# Patient Record
Sex: Male | Born: 1935 | Race: White | Hispanic: No | Marital: Married | Smoking: Former smoker
Health system: Southern US, Community
[De-identification: ages and names within clinical notes are randomized; demographics above are authoritative.]

## PROBLEM LIST (undated history)

## (undated) DIAGNOSIS — N183 Chronic kidney disease, stage 3 unspecified: Secondary | ICD-10-CM

## (undated) DIAGNOSIS — I6529 Occlusion and stenosis of unspecified carotid artery: Secondary | ICD-10-CM

## (undated) DIAGNOSIS — F419 Anxiety disorder, unspecified: Secondary | ICD-10-CM

## (undated) DIAGNOSIS — M79606 Pain in leg, unspecified: Secondary | ICD-10-CM

## (undated) DIAGNOSIS — E785 Hyperlipidemia, unspecified: Secondary | ICD-10-CM

## (undated) DIAGNOSIS — I10 Essential (primary) hypertension: Secondary | ICD-10-CM

## (undated) DIAGNOSIS — I2510 Atherosclerotic heart disease of native coronary artery without angina pectoris: Secondary | ICD-10-CM

## (undated) HISTORY — DX: Hyperlipidemia, unspecified: E78.5

## (undated) HISTORY — DX: Occlusion and stenosis of unspecified carotid artery: I65.29

## (undated) HISTORY — DX: Essential (primary) hypertension: I10

## (undated) HISTORY — DX: Pain in leg, unspecified: M79.606

## (undated) HISTORY — DX: Atherosclerotic heart disease of native coronary artery without angina pectoris: I25.10

---

## 2005-06-06 ENCOUNTER — Encounter: Payer: Self-pay | Admitting: Cardiology

## 2005-07-31 ENCOUNTER — Ambulatory Visit: Payer: Self-pay | Admitting: Cardiology

## 2005-08-03 ENCOUNTER — Ambulatory Visit: Payer: Self-pay | Admitting: Cardiology

## 2005-08-07 ENCOUNTER — Ambulatory Visit: Payer: Self-pay | Admitting: Cardiovascular Disease

## 2005-08-07 ENCOUNTER — Inpatient Hospital Stay (HOSPITAL_BASED_OUTPATIENT_CLINIC_OR_DEPARTMENT_OTHER): Admission: RE | Admit: 2005-08-07 | Discharge: 2005-08-07 | Payer: Self-pay | Admitting: Cardiology

## 2005-08-09 ENCOUNTER — Ambulatory Visit (HOSPITAL_COMMUNITY): Admission: RE | Admit: 2005-08-09 | Discharge: 2005-08-10 | Payer: Self-pay | Admitting: Cardiology

## 2005-08-09 ENCOUNTER — Ambulatory Visit: Payer: Self-pay | Admitting: Cardiology

## 2005-08-09 ENCOUNTER — Encounter: Payer: Self-pay | Admitting: Cardiology

## 2005-08-09 HISTORY — PX: CORONARY STENT PLACEMENT: SHX1402

## 2005-08-21 ENCOUNTER — Ambulatory Visit: Payer: Self-pay | Admitting: Cardiology

## 2006-01-31 ENCOUNTER — Ambulatory Visit: Payer: Self-pay | Admitting: Cardiology

## 2006-02-12 ENCOUNTER — Ambulatory Visit: Payer: Self-pay | Admitting: Cardiology

## 2006-02-13 ENCOUNTER — Encounter: Payer: Self-pay | Admitting: Physician Assistant

## 2006-02-15 ENCOUNTER — Inpatient Hospital Stay (HOSPITAL_BASED_OUTPATIENT_CLINIC_OR_DEPARTMENT_OTHER): Admission: RE | Admit: 2006-02-15 | Discharge: 2006-02-15 | Payer: Self-pay | Admitting: Cardiovascular Disease

## 2006-02-15 ENCOUNTER — Ambulatory Visit: Payer: Self-pay | Admitting: Cardiovascular Disease

## 2006-03-02 ENCOUNTER — Ambulatory Visit: Payer: Self-pay | Admitting: Cardiology

## 2006-07-03 ENCOUNTER — Ambulatory Visit: Payer: Self-pay | Admitting: Cardiology

## 2006-07-23 ENCOUNTER — Ambulatory Visit (HOSPITAL_COMMUNITY): Admission: RE | Admit: 2006-07-23 | Discharge: 2006-07-23 | Payer: Self-pay | Admitting: Ophthalmology

## 2006-11-14 ENCOUNTER — Ambulatory Visit: Payer: Self-pay | Admitting: Vascular Surgery

## 2007-05-14 ENCOUNTER — Ambulatory Visit: Payer: Self-pay | Admitting: Cardiology

## 2008-02-04 ENCOUNTER — Ambulatory Visit: Payer: Self-pay | Admitting: Cardiology

## 2008-03-03 ENCOUNTER — Ambulatory Visit: Payer: Self-pay | Admitting: Vascular Surgery

## 2008-08-28 ENCOUNTER — Encounter: Payer: Self-pay | Admitting: Cardiology

## 2008-09-01 ENCOUNTER — Ambulatory Visit: Payer: Self-pay | Admitting: Vascular Surgery

## 2008-11-05 DIAGNOSIS — I10 Essential (primary) hypertension: Secondary | ICD-10-CM | POA: Insufficient documentation

## 2008-11-05 DIAGNOSIS — I2510 Atherosclerotic heart disease of native coronary artery without angina pectoris: Secondary | ICD-10-CM | POA: Insufficient documentation

## 2008-11-05 DIAGNOSIS — E785 Hyperlipidemia, unspecified: Secondary | ICD-10-CM | POA: Insufficient documentation

## 2009-01-28 ENCOUNTER — Encounter: Payer: Self-pay | Admitting: Cardiology

## 2009-02-05 ENCOUNTER — Encounter: Payer: Self-pay | Admitting: Cardiology

## 2009-03-04 ENCOUNTER — Ambulatory Visit: Payer: Self-pay | Admitting: Vascular Surgery

## 2009-08-26 ENCOUNTER — Ambulatory Visit: Payer: Self-pay | Admitting: Vascular Surgery

## 2010-04-14 ENCOUNTER — Ambulatory Visit: Payer: Self-pay | Admitting: Vascular Surgery

## 2010-08-10 ENCOUNTER — Other Ambulatory Visit: Payer: Self-pay | Admitting: Neurosurgery

## 2010-08-10 DIAGNOSIS — M5126 Other intervertebral disc displacement, lumbar region: Secondary | ICD-10-CM

## 2010-08-12 ENCOUNTER — Other Ambulatory Visit: Payer: Self-pay | Admitting: Neurosurgery

## 2010-08-12 ENCOUNTER — Ambulatory Visit
Admission: RE | Admit: 2010-08-12 | Discharge: 2010-08-12 | Disposition: A | Discharge status: Home / Self Care | Payer: Medicare Other | Source: Ambulatory Visit | Attending: Neurosurgery | Admitting: Neurosurgery

## 2010-08-12 DIAGNOSIS — M5126 Other intervertebral disc displacement, lumbar region: Secondary | ICD-10-CM

## 2010-08-30 ENCOUNTER — Other Ambulatory Visit: Payer: Self-pay | Admitting: Neurosurgery

## 2010-08-30 DIAGNOSIS — M5126 Other intervertebral disc displacement, lumbar region: Secondary | ICD-10-CM

## 2010-08-30 NOTE — Procedures (Signed)
CAROTID DUPLEX EXAM   INDICATION:  Followup known carotid disease.   HISTORY:  Diabetes:  Yes  Cardiac:  CAD  Hypertension:  Yes  Smoking:  No  Previous Surgery:  No  CV History:  Amaurosis Fugax No, Paresthesias No, Hemiparesis No                                       RIGHT             LEFT  Brachial systolic pressure:         152               164  Brachial Doppler waveforms:         WNL               WNL  Vertebral direction of flow:        Occluded          Antegrade  DUPLEX VELOCITIES (cm/sec)  CCA peak systolic                   88                80  ECA peak systolic                   127               255  ICA peak systolic                   106 (m)           314  ICA end diastolic                   31 (m)            104  PLAQUE MORPHOLOGY:                  Heterogeneous     Heterogeneous  PLAQUE AMOUNT:                      Mild              Moderate to severe  PLAQUE LOCATION:                    CCA/ICA/ECA       CCA/ICA/ECA   IMPRESSION:  1. High end 60% to 79% left ICA stenosis.  2. 1% to 39% right ICA stenosis.  3. Apparently occluded right vertebral artery, normal left vertebral      artery.  4. Stable findings from previous exam on Aug 26, 2009.   ___________________________________________  Di Kindle. Edilia Bo, M.D.   LT/MEDQ  D:  04/14/2010  T:  04/14/2010  Job:  [id number]

## 2010-08-30 NOTE — Assessment & Plan Note (Signed)
[facility]                          [facility]   NAME:Laser, VERLAND                    MRN:          [id number]  DATE:05/14/2007                            DOB:          [birth year]    PRIMARY CARDIOLOGIST:  Learta Codding, MD, [facility]   PRIMARY CARE PHYSICIAN:  Donzetta Sprung, MD   REASON FOR VISIT:  Ten month follow-up.   HISTORY OF PRESENT ILLNESS:  Mr. Canela is a 76 year old male patient  with a history of coronary artery disease status post stenting to the  RCA in April 2007 with chronic high-grade ostial stenosis in a small  left circumflex which is treated medically who presents to the office  today for follow-up.  Please refer to Gene Serpe's, P.A.-C note from  July 03, 2006 for complete details.   The patient does have significant left ICA stenosis.  He is followed by  Dr. Cari Caraway in [place].  The patient tells me he was last at  Dr. Adele Dan office this past summer and apparently had re-look carotid  Dopplers without significant change.   The patient denies any intercurrent development of chest discomfort.  He  denies any exertional chest heaviness or tightness.  He is doing quite  well, walking a mile or more every day and using his other cardiac  equipment at home without chest pain or shortness of breath.  He denies  any syncope or near syncope, orthopnea, PND or pedal edema.  He denies  any palpitations.   CURRENT MEDICATIONS:  1. Aspirin 325 mg daily.  2. Atenolol 50 mg daily.  3. Lisinopril/hydrochlorothiazide 25/12.5 mg daily.  4. Metformin 500 mg four times a day.  5. Simvastatin 80 mg nightly.  6. Isosorbide 30 mg daily.  7. Nitroglycerin p.r.n. chest pain.   ALLERGIES:  CODEINE AND IVP DYE.   PHYSICAL EXAM:  He is well-nourished, well-developed male in no acute  distress. Blood pressure is 138/73, pulse 69, weight 187 pounds.  HEENT is normal.  NECK:  Without JVD.  CARDIAC:  S1, S2,  regular rate and rhythm without murmur.  LUNGS:  Clear to auscultation bilaterally.  Without wheezing, rhonchi or  rales.  ABDOMEN: Soft and nontender.  There is no hepatosplenomegaly.  EXTREMITIES: Without edema.  NEUROLOGIC:  He is alert and oriented x3.  Cranial nerves II-XII grossly  intact.   Electrocardiogram has significant noise and we will repeat an EKG before  he leaves office today.   IMPRESSION:  1. Coronary artery disease.  Status post bare-metal stenting to the      right coronary artery in April 2007.      a.     Cardiac catheterization November 2007 with patent right       coronary artery stent, 70-80% ostial stenosis in a small left       circumflex, totally occluded third obtuse marginal collateralized       from the distal AV groove circumflex.  2. Preserved left ventricular function.  3. Cerebral vascular disease.      a.     Left ICA stenosis 60-79%, followed by Dr.  Cari Caraway.  4. Diabetes mellitus.  5. Hyperlipidemia followed by Dr. Reuel Boom.  6. Hypertension.  7. IV DYE ALLERGY.   PLAN:  The patient presents to the office today for follow-up.  Overall,  he is doing well without any complaints of chest pain, shortness of  breath.  He questioned whether not he could come off of his isosorbide.  At this point in time we plan to:  1. His repeat electrocardiogram in the office today reveals normal      sinus rhythm with heart rate of 62, normal axis, no acute changes.  2. I have explained to him that he likely has some parts of his      coronary anatomy that could contribute to chest pain.  He is      welcome to try a holiday from isosorbide.  If he develops any      symptoms of chest discomfort or shortness of breath or decreased      exercise tolerance,  I have explained to him, that he should go      back on isosorbide and remain on it permanently.  He is in      agreement with this plan.  3. He will follow up with Dr. Reuel Boom for management of his  lipids.      His goal LDL is less than 70.  4. Follow-up with Dr. Edilia Bo for his carotid artery disease.  5. We will see him back in follow-up in the next 6 months or sooner      p.r.n.      Tereso Newcomer, PA-C  Electronically Signed      Learta Codding, MD,FACC  Electronically Signed   SW/MedQ  DD: 05/14/2007  DT: 05/14/2007  Job #: [id number]   cc:   Donzetta Sprung

## 2010-08-30 NOTE — Procedures (Signed)
CAROTID DUPLEX EXAM   INDICATION:  Carotid disease.   HISTORY:  Diabetes:  Yes.  Cardiac:  CAD.  Hypertension:  Yes.  Smoking:  No.  Previous Surgery:  No.  CV History:  Currently asymptomatic.  Amaurosis Fugax No, Paresthesias No, Hemiparesis No.                                       RIGHT             LEFT  Brachial systolic pressure:         130               136  Brachial Doppler waveforms:         Normal            Normal  Vertebral direction of flow:        Not detected      Antegrade  DUPLEX VELOCITIES (cm/sec)  CCA peak systolic                   118               90  ECA peak systolic                   135               196  ICA peak systolic                   137               322  ICA end diastolic                   37                99  PLAQUE MORPHOLOGY:                  Mixed             Mixed  PLAQUE AMOUNT:                      Mild              Moderate/severe  PLAQUE LOCATION:                    ICA, ECA          ICA, ECA   IMPRESSION:  1. 40% to 59% stenosis of the right internal carotid artery.  2. High-end 60% to 79% stenosis of the left internal carotid artery.  3. Mild increase in the right internal carotid artery Doppler      velocities when compared to the previous examination on 03/04/2009      with the left internal carotid artery remaining stable.   ___________________________________________  Di Kindle. Edilia Bo, M.D.   CH/MEDQ  D:  08/26/2009  T:  08/26/2009  Job:  [id number]

## 2010-08-30 NOTE — Procedures (Signed)
CAROTID DUPLEX EXAM   INDICATION:  Follow up evaluation of known carotid artery disease.   HISTORY:  Diabetes:  Yes.  Cardiac:  Coronary artery disease.  Hypertension:  Yes.  Smoking:  No.  Previous Surgery:  No.  CV History:  Patient reports no cerebrovascular symptoms at this time.  Previous duplex on March 03, 2008, revealed 20% to 39% right ICA  stenosis and 60% to 79% left ICA stenosis.  Amaurosis Fugax No, Paresthesias No, Hemiparesis No.                                       RIGHT             LEFT  Brachial systolic pressure:         148               148  Brachial Doppler waveforms:         Triphasic         Triphasic  Vertebral direction of flow:        Inaudible         Antegrade  DUPLEX VELOCITIES (cm/sec)  CCA peak systolic                   91                80  ECA peak systolic                   150               239  ICA peak systolic                   113               271  ICA end diastolic                   29                83  PLAQUE MORPHOLOGY:                  Calcified         Calcified,  irregular  PLAQUE AMOUNT:                      Mild to moderate  Moderate  PLAQUE LOCATION:                    Proximal ICA      Proximal ICA, ECA   IMPRESSION:  1. Right vertebral artery is occluded.  2. A 20% to 39% right ICA stenosis.  3. A 60% to 79% left ICA stenosis.  4. No significant change from previous study performed March 03, 2008.   ___________________________________________  Di Kindle. Edilia Bo, M.D.   MC/MEDQ  D:  09/01/2008  T:  09/01/2008  Job:  [id number]

## 2010-08-30 NOTE — Assessment & Plan Note (Signed)
[facility]                          [facility] NOTE   NAME:[name], [name]                    MRN:          [id number]  DATE:02/04/2008                            DOB:          [birth year]    REFERRING PHYSICIAN:  Donzetta Sprung   REFERRING PHYSICIAN:  Wallis Bamberg. Reuel Boom, DO   HISTORY OF PRESENT ILLNESS:  [name] is a 76 year old male with a  history of coronary artery disease status post stent to the RCA in April  2007 with chronic high-grade ostial stenosis of the small left  circumflex, treated medically.  The patient has been doing well.  He  reports no chest pain, orthopnea, PND, palpitations, or syncope.  Last  office visit, he was taken off Imdur and has had no further chest pain.  He is requesting followup with Dr. Edilia Bo regarding his carotid artery  disease.   MEDICATIONS:  1. Atenolol 50 mg p.o. daily.  2. Lisinopril/hydrochlorothiazide 20/12.5 daily.  3. Metformin 500 mg p.o. q.i.d.  4. Simvastatin 80 mg daily.  5. Aspirin 81 mg daily.   ALLERGIES:  CODEINE and IVP DYE.   PHYSICAL EXAMINATION:  VITAL SIGNS:  Blood pressure 160/79, heart rate  60, weight 188 pounds.  NECK:  Normal carotid upstroke with no definite bruits.  HEART:  Regular rate and rhythm.  Normal S1 and S2.  No murmurs, rubs,  or gallops.  ABDOMEN:  Soft.  EXTREMITIES:  No cyanosis, clubbing, or edema.  NEURO:  The patient alert and oriented, grossly nonfocal.   PROBLEMS:  1. Coronary artery disease.      a.     Status post bare-metal stent to the right coronary artery in       April 2007.      b.     Ostial stenosis in the small left circumflex.  2. Preserved left ventricular function.  3. Cerebrovascular disease.      a.     Left internal carotid artery stenosis 79%, followed by Dr.       Waverly Ferrari.  4. Diabetes mellitus.  5. Dyslipidemia.  6. Hypertension.  7. IVP DYE allergy.   PLAN:  1. The patient will follow up with her  primary care with Dr. Reuel Boom      regarding lipids and hypertension control.  2. From the coronary artery disease perspective, the patient is doing      quite well with no recurrent chest pain and is only due for stress      test in 1 year from now.  3. EKG was reviewed in the office today and was within normal limits.     Learta Codding, MD,FACC  Electronically Signed    GED/MedQ  DD: 02/04/2008  DT: 02/05/2008  Job #: [id number]   cc:   Donzetta Sprung

## 2010-08-30 NOTE — Procedures (Signed)
CAROTID DUPLEX EXAM   INDICATION:  Follow-up carotid doppler.   HISTORY:  Diabetes:  Yes, on oral medication.  Cardiac:  Coronary artery disease, PTCA in 2007.  Hypertension:  Yes.  Smoking:  No.  Previous Surgery:  No.  CV History:  No  Amaurosis Fugax no.  Paresthesias no.  Hemiparesis no.                                         RIGHT             LEFT  Brachial systolic pressure:         100.              90.  Brachial Doppler waveforms:         Biphasic.         Biphasic.  Vertebral direction of flow:        Not detected      Antegrade  DUPLEX VELOCITIES (cm/sec)  CCA peak systolic                   92.               74.  ECA peak systolic                   204.              204.  ICA peak systolic                   99.               210.  ICA end diastolic                   31.               85.  PLAQUE MORPHOLOGY:                  Calcified.        Calcified.  PLAQUE AMOUNT:                      Moderate to large.                  Moderate to large.  PLAQUE LOCATION:                    ICA, ECA.         ICA, ECA.   IMPRESSION:      1.Bilateral external carotid artery stenoses.      2.     20% to 39% right internal carotid artery stenosis.      3.60% to 79% left internal carotid artery stenosis.      4.Right vertebral artery not detected, possible occlusion.      5.Study essentially unchanged from 04/18/06.   ___________________________________________  Di Kindle. Edilia Bo, M.D.   DP/MEDQ  D:  11/14/2006  T:  11/15/2006  Job:  [id number]

## 2010-08-30 NOTE — Assessment & Plan Note (Signed)
[facility]                          [facility] NOTE   NAME:[name], [name]                    MRN:          [id number]  DATE:02/04/2008                            DOB:          04/26/1934    REFERRING PHYSICIAN:  Lanell Matar, DO   HISTORY OF PRESENT ILLNESS:  The patient is a.    DICTATION ENDS HERE.     Learta Codding, MD,FACC     GED/MedQ  DD: 02/04/2008  DT: 02/05/2008  Job #: [id number]

## 2010-08-30 NOTE — Procedures (Signed)
CAROTID DUPLEX EXAM   INDICATION:  Follow-up carotid artery disease.   HISTORY:  Diabetes:  Yes.  Cardiac:  CAD, PTCA.  Hypertension:  Yes.  Smoking:  No.  Previous Surgery:  No.  CV History:  Asymptomatic.  Amaurosis Fugax No, Paresthesias No, Hemiparesis No.                                       RIGHT             LEFT  Brachial systolic pressure:         150               146  Brachial Doppler waveforms:         Normal            Normal  Vertebral direction of flow:        Not detected      Antegrade  DUPLEX VELOCITIES (cm/sec)  CCA peak systolic                   121               107  ECA peak systolic                   165               255  ICA peak systolic                   98                295  ICA end diastolic                   29                85  PLAQUE MORPHOLOGY:                  Heterogeneous     Heterogeneous  PLAQUE AMOUNT:                      Mild              Moderate  PLAQUE LOCATION:                    ICA/ECA           ICA/ECA/CCA   IMPRESSION:  1. A 1-39% stenosis of the right internal carotid artery.  2. High-end 60-79% stenosis of the left internal carotid artery.  3. The right vertebral artery flow could not be detected, which      indicates a possible occlusion.  4. No significant change noted when compared to the previous exam on      11/14/2006.   ___________________________________________  Di Kindle. Edilia Bo, M.D.   CH/MEDQ  D:  03/03/2008  T:  03/03/2008  Job:  [id number]

## 2010-08-30 NOTE — Assessment & Plan Note (Signed)
OFFICE VISIT   [name], [name]  DOB:  02-Jul-1934                                       03/03/2008  CHART#:[id number]   I saw the patient in the office today for continued followup of his left  carotid stenosis.  I had seen him in January of 2008 with a 60-79% left  carotid stenosis.  He was asymptomatic and I explained that we generally  do not consider carotid endarterectomy unless the stenosis progressed to  greater than 80%.  He was to come back in 6 months for a followup duplex  scan.  In July of 2008 he had a followup duplex scan which showed no  significant change in the left carotid stenosis.  He was then lost to  followup and Dr. Andee Lineman picked this up and set him up for a carotid  duplex scan.   Since I saw him last he has had no history of stroke, TIAs, expressive  or receptive aphasia or amaurosis fugax.   REVIEW OF SYSTEMS:  On review of systems he has had no chest pain, chest  pressure, palpitations or arrhythmias.  He has had no bronchitis, asthma  or wheezing.   PHYSICAL EXAMINATION:  On physical examination this is a pleasant 76-  year-old gentleman who appears his stated age.  Blood pressure is  148/26, heart rate is 67.  Neck is supple.  He has a left carotid bruit.  Lungs are clear bilaterally to auscultation.  On cardiac exam he has a  regular rate and rhythm without significant murmur.  Abdomen is soft and  nontender.  Neurologic exam is nonfocal.   Carotid duplex scan shows that the left carotid stenosis has not  changed, still in the 60-79% range and the velocities are essentially  the same as in July of 2008.  He has no significant stenosis on the  right.  He does have possible right vertebral artery occlusion but is  asymptomatic from the standpoint with no history of right arm symptoms  or dizziness.   I have recommend a followup duplex scan in 6 months and we will be sure  he gets back for this.  He knows to call sooner if  he has problems.  In  the meantime he knows to continue taking his aspirin.   Di Kindle. Edilia Bo, M.D.  Electronically Signed   CSD/MEDQ  D:  03/03/2008  T:  03/04/2008  Job:  [id number]   cc:   Learta Codding, MD,FACC  Donzetta Sprung

## 2010-08-30 NOTE — Procedures (Signed)
CAROTID DUPLEX EXAM   INDICATION:  Follow-up of carotid disease.   HISTORY:  Diabetes:  Yes  Cardiac:  CAD  Hypertension:  Yes  Smoking:  No  Previous Surgery:  No  CV History:  Asymptomatic  Amaurosis Fugax No, Paresthesias No, Hemiparesis No                                       RIGHT             LEFT  Brachial systolic pressure:         124               126  Brachial Doppler waveforms:         Triphasic         Triphasic  Vertebral direction of flow:        Occluded?         Antegrade  DUPLEX VELOCITIES (cm/sec)  CCA peak systolic                   71                75  ECA peak systolic                   141               172  ICA peak systolic                   101               301  ICA end diastolic                   35                104  PLAQUE MORPHOLOGY:                  Mixed             Mixed  PLAQUE AMOUNT:                      Mild              Severe  PLAQUE LOCATION:                    Bifurcation, ICA. Bifurcation, ICA,  ECA.   IMPRESSION:  1. High grade 60%-79% stenosis in the left internal carotid artery.  2. Possibly occluded right vertebral.   ___________________________________________  Di Kindle. Edilia Bo, M.D.   CJ/MEDQ  D:  03/04/2009  T:  03/04/2009  Job:  [id number]

## 2010-08-31 ENCOUNTER — Ambulatory Visit
Admission: RE | Admit: 2010-08-31 | Discharge: 2010-08-31 | Disposition: A | Discharge status: Home / Self Care | Payer: Medicare Other | Source: Ambulatory Visit | Attending: Neurosurgery | Admitting: Neurosurgery

## 2010-08-31 DIAGNOSIS — M5126 Other intervertebral disc displacement, lumbar region: Secondary | ICD-10-CM

## 2010-09-02 NOTE — Assessment & Plan Note (Signed)
[facility]                            [facility] NOTE   NAME:[name], [name]                    MRN:          [id number]  DATE:02/12/2006                            DOB:          [birth year]    ADDENDUM TO JOB #[id number]:   IMPRESSION:  IVP DYE allergy.   PLAN:  The patient will also require premedication for reported history of  contrast dye allergy.     ______________________________  Rozell Searing, PA-C    ______________________________  Jonelle Sidle, MD   GS/MedQ  DD: 02/12/2006  DT: 02/13/2006  Job #: [id number]

## 2010-09-02 NOTE — Discharge Summary (Signed)
[name], [name]           ACCOUNT NO.:  [id number]   MEDICAL RECORD NO.:  [id number]          PATIENT TYPE:  OIB   LOCATION:  6532                         FACILITY:  [facility]   PHYSICIAN:  Salvadore Farber, M.D. LHCDATE OF BIRTH:  [birth year]   DATE OF ADMISSION:  08/09/2005  DATE OF DISCHARGE:  08/10/2005                                 DISCHARGE SUMMARY   CARDIOLOGIST:  Learta Codding, M.D.   PRINCIPAL DIAGNOSIS:  Coronary artery disease, status post elective bare  metal stenting, mid right coronary artery.   SECONDARY DIAGNOSIS:  1.  Type 2 diabetes mellitus.  2.  Dyslipidemia.  3.  Hypertension.   OPERATION/PROCEDURE:  Coronary angiogram/bare metal stenting by Dr. Randa Evens, April 25.   REASON FOR ADMISSION:  Mr. Fines is a 76 year old male, with no prior  cardiac history, recently referred to Dr. Andee Lineman for follow up of an  abnormal stress perfusion study.  He was referred for a diagnostic cardiac  catheterization in the [facility] lab on April 23 and now returns for elective  percutaneous intervention of the right coronary artery.   HOSPITAL COURSE:  The patient underwent successful bare metal stenting of an  80% mid right coronary artery stenosis by Dr. Samule Ohm with no noted  complications.  The patient was kept for overnight observation and cleared  for discharge the following morning in hemodynamically stable condition.  The patient did have a small elevation of the MB which was felt to be due to  jailing of a small acute marginal artery.  Dr. Samule Ohm also noted finding of  a very soft bruit which he noted was present prior to the procedure and  thus, no further work-up was indicated.  Dr. Samule Ohm recommended treatment  with Plavix for 30 days.   DISCHARGE LABORATORY DATA:  Sodium 142, potassium 4.3, glucose 129, BUN 12,  creatinine 1.3, WBC 6.2, hemoglobin 11.3, hematocrit 32, MCV 90, platelets  180.  Outstanding labs:  CPK 123/8.2 (6.7%)   DISCHARGE  MEDICATIONS:  1.  Plavix 75 mg daily for 30 days.  2.  Coated aspirin 325 mg daily.  3.  Atenolol 50 mg daily.  4.  Isosorbide mononitrate 20 mg daily.  5.  Lisinopril/HCT 20/12.5 mg daily.  6.  Lipitor 40 mg daily.  7.  Avandia 8 mg daily.  8.  Nitrostat 0.4 mg.   DISCHARGE INSTRUCTIONS:  Refer to cardiac catheterization discharge sheet  for further instructions.   FOLLOW UP:  Learta Codding, M.D. on May 7 at 10 a.m. at his [place] clinic.   DISPOSITION:  Stable.   Discharge duration:  Less than 30 minutes.      Rozell Searing, P.A. [facility]      Salvadore Farber, M.D. [facility]  Electronically Signed    GS/MEDQ  D:  08/10/2005  T:  08/11/2005  Job:  [id number]   cc:   Donzetta Sprung  Fax: [phone]

## 2010-09-02 NOTE — Assessment & Plan Note (Signed)
[facility]                            [facility] OFF[facility] NOTE   NAME:[name], [name]                    MRN:          [id number]  DATE:03/02/2006                            DOB:          1934/09/11    PRIMARY CARDIOLOGIST:  Learta Codding, MD,FACC   REASON FOR OFFICE VISIT:  Post catheterization followup.   [name] returns following recent elective coronary angiogram on November  1 by Dr. Excell Seltzer.  This was following a recent abnormal stress test  suggestive of inferior ischemia in the context of previous bare metal  stenting of the mid right coronary artery in April of this year.   Dr. Excell Seltzer, however, found stable coronary anatomy, essentially unchanged  from the previous study.  He noted that the RCA stent site was widely patent  with severe diffuse disease of the PDA.  Given the small caliber of the PDA  vessel, he felt that it was not amenable to percutaneous intervention and,  therefore, recommended medical therapy.  He provided a prescription for  Imdur, but Mr. Brucato has not yet filled this.   From a clinical standpoint, the patient tells me today that he has not had  any angina pectoris since the catheterization.  He points out that he has  had chest pain only when using a treadmill at home under strenuous  conditions.  In fact, he tells me he had no chest pain during the recent  stress test here at [facility].  Additionally, he states that his  current chest pain is not as severe as that which was associated with the  stent back in April.   The patient also was referred for carotid Doppler analysis which was just  performed 2 days ago.  This was following the finding of a left carotid  bruit.  The results of the study are abnormal and suggestive of occlusion of  the distal right internal carotid artery with 50% proximal, 60-70% mid left  ICA stenosis and occlusion of the right vertebral artery.   The patient  reports no associated symptoms suggestive of a TIA.   CURRENT MEDICATIONS:  1. Coated aspirin 325 mg daily.  2. Atenolol 50 daily.  3. Lisinopril/HCT 20/4.5 daily.  4. Metformin 500 four times a day.  5. Simvastatin 80 daily.   PHYSICAL EXAMINATION:  VITAL SIGNS: Blood pressure 118/60, pulse 60 and  regular. Weight 196.  GENERAL: A 76 year old male sitting upright, in no current distress.  NECK: Palpable bilateral carotid pulses with no bruit on the right but with  a bruit on the left.  LUNGS: Clear to auscultation all fields.  HEART:  Regular rate and rhythm (S1, S2).  No murmurs, rubs, or gallops.  ABDOMEN: Soft, nontender.  EXTREMITIES:  Right groin stable with no ecchymosis, hematoma, or bruit on  auscultation; intact right femoral and distal pulse.  NEUROLOGIC: No focal deficits.   IMPRESSION:  1. Multivessel coronary artery disease, stable.      a.     Widely patent mid right coronary artery stent with residual       nonobstructive left anterior descending  disease; high-grade ostial       stenosis of small left circumflex artery; 100% occluded third obtuse       marginal stenosis with left-to-left collateralization; severe, diffuse       posterior descending artery disease (small caliber) by recent coronary       angiogram.      b.     Normal left ventricular function.      c.     Status post bare metal stenting, mid right coronary artery,       April 2007.  2. Severe cerebrovascular disease with occlusion of the distal right      internal carotid artery with significant left internal carotid artery      disease and suggestion of right vertebral artery stenosis by current      duplex study.  3. Hyperlipidemia.  4. Type 2 diabetes mellitus.  5. Hypertension.  6. CONTRAST DYE allergy.   PLAN:  1. Following review of the recent carotid Doppler study with Dr. Andee Lineman,      recommendation is to refer Mr. Lobue to CVTS in [place] for      consideration of surgical  treatment.  I have reviewed these results      with the patient, and he is agreeable to proceed with further      evaluation by the vascular surgeons in [place].  2. Continue medical therapy.  I advised Mr. Hackbart to fill his      prescription for Imdur and to continue on this medication indefinitely.  3. Schedule return clinic followup with myself and Dr. Andee Lineman in 3 months      for reassessment of his clinical status.      Gene Serpe, PA-C  Electronically Signed      Learta Codding, MD,FACC  Electronically Signed   GS/MedQ  DD: 03/02/2006  DT: 03/02/2006  Job #: [id number]   cc:   Donzetta Sprung

## 2010-09-02 NOTE — Cardiovascular Report (Signed)
[name], [name]           ACCOUNT NO.:  [id number]   MEDICAL RECORD NO.:  [id number]          PATIENT TYPE:  OIB   LOCATION:  1962                         FACILITY:  [facility]   PHYSICIAN:  Veverly Fells. Excell Seltzer, MD  DATE OF BIRTH:  [birth year]   DATE OF PROCEDURE:  DATE OF DISCHARGE:  02/15/2006                              CARDIAC CATHETERIZATION   DATE OF PROCEDURE:  February 15, 2006.   PROCEDURE:  Left heart catheterization, selective coronary angiography, left  ventricular angiography.   INDICATION:  [name] is a very nice 76 year old gentleman who has known  coronary artery disease.  He has diffuse 3 vessel disease and is status post  stenting of the mid right coronary artery in April of this year.  The  remainder of his disease has been treated medically as he has small vessels  with a chronic occlusion which is well collateralized.  He had some chest  discomfort a few weeks back while working out on the treadmill.  He  underwent an exercise Cardiolite that demonstrated inferior ischemia.  He  subsequently was referred for cardiac catheterization to rule out a problem  with InStent restenosis in the mid right coronary artery or progression of  his disease.   PROCEDURAL DETAILS:  Risks and indications of the procedure were explained  in detail to the patient.  Informed consent was obtained.  The right groin  was prepped, draped, and anesthetized with 1% lidocaine under normal sterile  conditions.  Using a modified Seldinger technique, a 4 French arterial  sheath was placed in the right femoral artery.  Multiple angiographic views  of the right and left coronary arteries were taken.  For the left coronary  artery, a 4 [place] JL4 catheter was used.  For the right coronary artery, a  4 French 3DRC catheter was used.  Following selective coronary angiography,  an angle pigtail catheter was placed in the left ventricle.  Left  ventricular pressures were recorded.  A 30 degree  right anterior oblique  left ventriculogram was performed.  A pullback across the aortic valve was  done.  At the conclusion of the case, the patient was transferred to the  holding area where his sheath will be pulled and a manual pressure used for  hemostasis.   FINDINGS:  Aortic pressure 135/67 with a mean of 98, left ventricular  pressure 140/7 with an end diastolic pressure of 11.  There was no  significant aortic stenosis.   Left main stem is calcified.  There is no significant angiographic disease  throughout the left main stem.   The left main stem bifurcates into the LAD and left circumflex.  The LAD is  a diffusely diseased vessel.  It is a medium caliber vessel that courses  down to the distal anterior wall but does not reach the apex.  There is  calcification and diffuse non obstructive disease throughout.  It gives off  2 medium caliber diagonal branches, both of which have non obstructive  disease.  There is no high grade obstructive disease throughout the LAD  system.   The left circumflex is a small  diameter vessel.  It has an osteal 70-80%  stenosis.  It is also calcified.  There is a small 1st obtuse marginal and a  medium caliber 2nd obtuse marginal.  There is no significant disease in the  first 2 OM's.  There is a 3rd obtuse marginal that is completely occluded  and it is well collateralized from the distal AV groove circumflex.   The true circumflex itself is occluded just beyond the 2nd obtuse marginal,  and again, is well collateralized from an atrial branch.   Right coronary artery is severely calcified.  The proximal vessel has non  obstructive disease.  The mid vessel has a 40% stenosis.  There is a stent  in the mid to distal vessel that is widely patent.  The distal vessel has  non obstructive disease.  The RCA terminates in a PDA and a posterior AV  segment which gives off 2  posterolateral branches, the 2nd of which is a  large vessel.  The PDA has a  long segment of severe stenosis in a range of  80-90%.  The vessel is diffusely diseased and is relatively small diameter.  The posterolateral branches have diffuse non obstructive disease throughout  in the range of 25-50%.   Left ventriculogram demonstrates normal left ventricular function with an  estimated left ventricular ejection fraction of 65%.   ASSESSMENT:  1. Diffuse 3 vessel coronary artery disease.      a.     Calcified, non obstructive disease throughout the LAD.      b.     Small left circumflex with high grade osteal stenosis and an       occluded 3rd obtuse marginal that is well collateralized from left to       left collaterals.      c.     Diffusely diseased right coronary artery with a widely patent       stent in the mid right coronary artery.  There is severe diffuse       disease of the PDA which is a small diameter vessel.  2. Normal left ventricular function.   PLAN:  The patient has no significant changes since his last catheterization  back in April of this year.  His stent in the mid right coronary artery is  widely patent.  He is not having dramatic, life style limiting symptoms at  this point.  I think his inferior wall ischemia is secondary to his PDA  disease.  If he develops progressive symptoms that are refractory to medical  therapy, it would be reasonable to perform PCI on his PDA.  However, it  would involve a long, bare metal stent as it is an area of diffuse disease  and it is a small diameter vessel.  I think the long term results of  intervening on his PDA would not be very good.  Because of that, I would  start with medical therapy and only perform PCI if we are unable to control  his symptoms.      Veverly Fells. Excell Seltzer, MD  Electronically Signed     MDC/MEDQ  D:  02/15/2006  T:  02/15/2006  Job:  [id number]   cc:   Learta Codding, MD,FACC  Jonelle Sidle, MD

## 2010-09-02 NOTE — Cardiovascular Report (Signed)
[name], [name]           ACCOUNT NO.:  [id number]   MEDICAL RECORD NO.:  [id number]          PATIENT TYPE:  OIB   LOCATION:  6532                         FACILITY:  [facility]   PHYSICIAN:  Salvadore Farber, M.D. LHCDATE OF BIRTH:  1934/10/28   DATE OF PROCEDURE:  08/09/2005  DATE OF DISCHARGE:                              CARDIAC CATHETERIZATION   PROCEDURE:  Bare-metal stenting of the mid-LAD.   INDICATIONS:  Mr. Nouri is a 76 year old gentleman who presents with [place] Heart Association Class III angina which has been refractory to medical  therapy. He underwent diagnostic angiography two days ago by Dr. Eden Emms.  This demonstrated a total occlusion of obtuse marginal branch with fairly  robust left-to-left collaterals.  He also had an 80% stenosis of the mid RCA  and a 90% stenosis of a small posterior descending artery.  I was asked to  revascularize the mid RCA lesion. My recommendation is for conservative  management of both the PDA and OM.   PROCEDURAL TECHNIQUE:  Informed consent was obtained.  Under 1% lidocaine  local anesthesia, a 6-French sheath was placed in the right common femoral  artery using the modified Seldinger technique.  Anticoagulation was  initiated with bivalirudin.  ACT was confirmed to be greater than 225  seconds.  The patient had been loaded on Plavix 48 hours prior to the  procedure and maintained on 75 mg per day thereafter.  He was continued on  aspirin.   An 6-French ART 3.5 guide was advanced over a wire and engaged the ostium of  the RCA.  A Prowater wire was advanced to the distal RCA without difficulty.  I then attempted to predilate the lesion using a 2.5 x 9 mm Maverick.  At 10  atmospheres, the lesion would not yield.  I therefore removed this balloon  and then predilated using a 2.5 x 8 mm Maverick at 14 atmospheres.  With  this, the lesion did yield.  I then attempted to deliver a 3.0 x 12 mm  Vision stent.  However, I was  unable to deliver it beyond a calcified  tortuous segment of the more proximal RCA.  I then advanced a wiggle wire to  be used, leaving the Prowater wire as a buddy.  Over the wiggle wire, I was  able to advance the 3.0 x 12 mm Vision with some difficulty.  It was  positioned across the lesion and deployed at 16 atmospheres.  I then  postdilated this stent using a 3.0 x 12 mm Quantum at 18 atmospheres.  Final  angiography demonstrated no residual stenosis and TIMI III flow to the  distal vasculature.  There was an area of haziness proximal to the stent.  I  attempted to perform intravascular ultrasound of this but was unable to  deliver the [facility] catheter.  I tried this over both the wiggle wire and  the Prowater.  In both cases, it would not pass the proximal portion of the  vessel.  Repeat angiography demonstrated complete resolution of the haziness  after additional nitroglycerin.  i thus felt was not dissection and to  be  managed conservatively.   The arteriotomy was then closed using a Star close device.  Complete  hemostasis was obtained.  He was then transferred to the holding room in  stable condition, having tolerated the procedure well.   COMPLICATIONS:  None.   IMPRESSION/RECOMMENDATIONS:  Successful bare-metal stenting of the mid RCA.  He should be maintained on Plavix for 30 days and aspirin indefinitely.      Salvadore Farber, M.D. [facility]  Electronically Signed     WED/MEDQ  D:  08/09/2005  T:  08/10/2005  Job:  [id number]   cc:   Learta Codding, M.D. [facility]  [address]  [address]  [place]  [place] [address]   Lia Hopping  Fax: [phone]

## 2010-09-02 NOTE — Assessment & Plan Note (Signed)
[facility]                            [facility] OFFICE NOTE   NAME:[name], [name]                    MRN:          [id number]  DATE:02/12/2006                            DOB:          05/25/1934    PRIMARY CARDIOLOGIST:  Dr. Lewayne Bunting.   REASON FOR OFFICE VISIT:  Abnormal exercise stress test.   [name] is a very pleasant 76 year old male, with known coronary artery  disease, who underwent bare metal stenting of the mid-right coronary artery  in April of this year.  This was following an abnormal stress test, and the  initial diagnostic catheterization revealed multi-vessel coronary artery  disease with 30% multiple proximal/mid-LAD stenoses; multiple 30-40% first  and second diagonal branch lesions; 80% proximal circumflex artery stenosis,  beginning at the ostium of the left main; 100% occlusion of the mid-LAD with  left-left collateralization; and, 30-40% multiple lesions of the proximal-  mid RCA, with the 80% distal RCA lesion; in addition, 40-50% multiple PDA  and PLA discreet lesions.  Left ventricular function with hyperdymanic (EF  of 70%).   Following placement of the bare metal stent, patient was kept on a regimen  of Plavix for 30 days.  He reports a compliance and has remained on Aspirin.   However, a few weeks ago, patient experienced some mild anterior chest  discomfort while using his treadmill at home.  This promptly resolved with  rest.  However, the discomfort was reminiscent of, but not nearly as  severely as, that which was prior to his percutaneous intervention.  He was  referred for an exercise stress Cardiolite, during which he exercised for  almost 10 minutes and had no associated chest discomfort, but which was  suggestive of a small/moderate inferior defect which appeared reversible and  with no associated significant ST abnormalities.  Ejection fraction was 66%.   Patient denies any symptoms at rest.   He has not had any chest discomfort  since several weeks ago, and he again confirmed that he did not have any  during his recent stress test.   CURRENT MEDICATIONS:  1. Coated aspirin 325 q.d.  2. Atenolol 50 q.d.  3. Lisinopril HCT 20/12.5 q.d.  4. Avandia 8 q.d.  5. Lipitor 80 q.d.   PHYSICAL EXAMINATION:  VITAL SIGNS:  Blood pressure 126/60, pulse 60,  regular.  Weight 196.  GENERAL:  A 76 year old male, sitting upright, no apparent distress.  HEENT:  Normocephalic, atraumatic.  NECK:  Palpable carotid pulses with high-pitched left carotid bruits.  LUNGS:  Clear to auscultation in all fields.  HEART:  Regular rate and rhythm (S1, S2).  No significant murmurs.  ABDOMEN:  Soft, nontender with intact bowel sounds.  EXTREMITIES:  Preserved peripheral pulses, slightly diminished on the left,  with no significant edema.  NEURO:  No focal deficits.   IMPRESSION:  1. Recurrent exertional angina pectoris.      a.     Worrisome for in-stent restenosis, with recent abnormal exercise       stress Cardiolite suggestive of inferior ischemia.  2. Multi-vessel coronary artery disease.  a.     Status post bare metal stenting.  Mid-right coronary artery,       April 2007.      b.     Residual 50% distal left main; non-obstructive LAD; 80% proximal       circumflex at ostium of the left main artery; 100% mid-LAD with left-       left collaterals; 50% PDA and PLA stenoses.      c.     Hyperdynamic LV function.  3. Hyperlipidemia.  4. Type 2 diabetes mellitus.  5. Hypertension.  6. Obesity.  7. Left carotid bruit.  8. IVP DYE allergy.   PLAN:  I reviewed the recent stress test results with [name] and  discussed the two possible options.  A repeat cardiac catheterization in the  [facility] lab to rule out in-stent restenosis versus the option of medical therapy.  I had also conferred with Dr. Simona Huh regarding these current findings,  and we discussed at length these two options.  The  patient, however, would  prefer to proceed with definitive exclusion of an in-stent restenotic lesion  and, therefore, has agreed to proceed with an elective cath to be scheduled  in the [facility] lab later this week.  We will continue current medication regimen  and hold his diabetic medication and his diuretic the morning of the  procedure.  We will also check complete blood work here in the office.  Of  note, patient will also need further evaluation of the left carotid bruit,  and we will therefore schedule carotid Dopplers to rule out significant ICA  disease.  The patient will also require premedication for reported history  of contrast dye allergy.     ______________________________  Rozell Searing, PA-C    ______________________________  Jonelle Sidle, MD   GS/MedQ  DD: 02/12/2006  DT: 02/13/2006  Job #: [id number]   cc:   Donzetta Sprung

## 2010-09-02 NOTE — Cardiovascular Report (Signed)
[name], [name]           ACCOUNT NO.:  [id number]   MEDICAL RECORD NO.:  [id number]          PATIENT TYPE:  OIB   LOCATION:  1963                         FACILITY:  [facility]   PHYSICIAN:  Charlton Haws, M.D.     DATE OF BIRTH:  [birth year]   DATE OF PROCEDURE:  DATE OF DISCHARGE:                              CARDIAC CATHETERIZATION   Coronary arteriography.   INDICATIONS:  Angina with an abnormal Myoview.   Left main coronary artery had a distal 40-50% stenosis.   Left anterior descending artery had 30% multiple discreet lesions in  proximal mid vessel.  The distal vessel was small diffusely diseased.  First  and second diagonal branches were small vessels with 30-40% multiple  discreet lesions.   The circumflex coronary artery had an 80% tubular lesion, proximally,  beginning at the ostium of the left main. The mid vessel was 100% occluded  with left-to-left collaterals.   The right coronary artery is extremely calcified.  There was 30-40% multiple  discreet lesions in the proximal and mid vessel.  There was an 80% discrete  lesion distally.  The PDA and PLA had 40-50% multiple discreet lesions.   ROA ventriculography:  ROA ventriculography showed hyperdynamic LV function  with an EF of 70%.  There was no gradient across the aortic valve, no MR.  Aortic pressure was 135/74, LV pressure was 135/15.   IMPRESSION:  [name] will be started on Imdur 30 mg a day.  He will be  loaded with Plavix.  We will refer him to the inpatient lab for angioplasty  of the distal right coronary artery on Wednesday.   His Myoview is abnormal in the inferior wall, and I suspect this is the  culprit vessel.   Despite having the ostial lesion in the circumflex, I think I would  initially angioplasty the right coronary artery and give him a trial of  medical therapy. He has not had all that much chest pain and it is clearly  exertional.   He has a good warning system, and I think he will  do well with angioplasty  of the right with Plavix and Imdur.  Intervening on the ostium of the  circumflex would be somewhat difficult and there is not much runoff since  the mid vessel is occluded with left-to-left collaterals.           ______________________________  Charlton Haws, M.D.     PN/MEDQ  D:  08/07/2005  T:  08/08/2005  Job:  [id number]   cc:   Learta Codding, M.D. [facility]  [address]  [address]  [place]  [place] [address]   Donzetta Sprung  Fax: [phone]

## 2010-09-02 NOTE — Assessment & Plan Note (Signed)
[name]                          [name] OFFICE NOTE   NAME:[name]                    MRN:          [id number]  DATE:07/03/2006                            DOB:          [birth year]    PRIMARY CARDIOLOGIST:  Dr. Lewayne Bunting.   REASON FOR VISIT:  Scheduled three-month followup.  Please refer to my  clinic note of 02/2006 for full details.   Since his last clinic followup, the patient has been seen by Dr. Cari Caraway for further evaluation of an abnormal carotid duplex scan.  He  did a repeat carotid Doppler, however, and confirmed the finding of 60-  79% left ICA stenosis.  However, it did not reveal any evidence of  distal right internal carotid artery occlusion.  This was in direct  opposition to what had been suggested by the Dopplers which we had  ordered here at [name].   Dr. Edilia Bo recommended continued medical management and close  monitoring with repeat Dopplers and followup with him in six months.   Clinically, [name] is doing extremely well since his last visit.  He is exercising on a regular basis at home, employing both the  treadmill and an exercycle.  He has only rare instances of mild chest  tightness with initial warm-up on the treadmill with prompt resolution  and with no recurrent symptoms after resuming his exercise routine.   CURRENT MEDICATIONS:  Aspirin, full dose.  Atenolol 50 mg daily.  Lisinopril HCT 20/12.5 daily.  Metformin 500 mg daily.  Simvastatin 80 mg daily.  Isosorbide 30 mg daily.   PHYSICAL EXAMINATION:  VITAL SIGNS:  Blood pressure 128/78.  Pulse 78,  regular.  Weight 191.6.  GENERAL:  This is a 76 year old male sitting upright and in no distress.  HEENT:  Normocephalic, atraumatic.  NECK:  Palpable bilateral carotid pulses with faint left carotid bruit,  no bruit on the right.  LUNGS:  Clear to auscultation in all fields.  HEART:  Regular rate and rhythm (S1, S2).  No  significant murmurs.  ABDOMEN:  Benign.  EXTREMITIES:  No significant edema.  NEURO:  No focal deficit.   IMPRESSION:  1. Multi-vessel coronary artery disease.      a.     Status post bare metal stent right coronary artery April       2007: Widely patent by re-look coronary angiogram November 2007.      b.     Residual nonobstructive LAD; high-grade osteal small CF axis       stenosis; 100% occluded OM3 with distal collaterization; severe       diffuse PDA disease.      c.     Normal left ventricular function.  2. Cerebral vascular disease.      a.     Left ICA stenosis 60-79%, followed by Dr. Cari Caraway.  3. Type 2 diabetes mellitus.  4. Hyperlipidemia, followed by Dr. Reuel Boom.  5. Hypertension.  6. CONTRAST DYE ALLERGY.   PLAN:  1. Down titrate aspirin to 81 mg daily.  2. Aggressive lipid management with target LDL goal of 70 or  less, per      Dr. Reuel Boom.  3. Schedule return clinic followup with myself and Dr. Andee Lineman in six      months.      Gene Serpe, PA-C  Electronically Signed      Learta Codding, MD,FACC  Electronically Signed   GS/MedQ  DD: 07/03/2006  DT: 07/03/2006  Job #: [id number]   cc:   Donzetta Sprung

## 2010-09-14 ENCOUNTER — Other Ambulatory Visit: Payer: Self-pay | Admitting: Neurosurgery

## 2010-09-14 DIAGNOSIS — M5126 Other intervertebral disc displacement, lumbar region: Secondary | ICD-10-CM

## 2010-09-15 ENCOUNTER — Ambulatory Visit
Admission: RE | Admit: 2010-09-15 | Discharge: 2010-09-15 | Disposition: A | Discharge status: Home / Self Care | Payer: Medicare Other | Source: Ambulatory Visit | Attending: Neurosurgery | Admitting: Neurosurgery

## 2010-09-15 ENCOUNTER — Other Ambulatory Visit: Payer: Self-pay | Admitting: Neurosurgery

## 2010-09-15 DIAGNOSIS — M5126 Other intervertebral disc displacement, lumbar region: Secondary | ICD-10-CM

## 2010-09-22 ENCOUNTER — Ambulatory Visit: Payer: Self-pay

## 2010-09-22 ENCOUNTER — Other Ambulatory Visit: Payer: Self-pay

## 2010-10-20 ENCOUNTER — Other Ambulatory Visit: Payer: Self-pay

## 2010-10-20 ENCOUNTER — Ambulatory Visit: Payer: Self-pay

## 2010-11-29 ENCOUNTER — Encounter: Payer: Self-pay | Admitting: Family Medicine

## 2010-12-14 ENCOUNTER — Encounter: Payer: Self-pay | Admitting: Vascular Surgery

## 2010-12-20 ENCOUNTER — Encounter: Payer: Self-pay | Admitting: Vascular Surgery

## 2010-12-21 ENCOUNTER — Encounter: Payer: Self-pay | Admitting: Vascular Surgery

## 2010-12-21 ENCOUNTER — Ambulatory Visit (INDEPENDENT_AMBULATORY_CARE_PROVIDER_SITE_OTHER): Payer: Medicare Other | Admitting: Vascular Surgery

## 2010-12-21 ENCOUNTER — Ambulatory Visit (INDEPENDENT_AMBULATORY_CARE_PROVIDER_SITE_OTHER): Payer: Medicare Other

## 2010-12-21 VITALS — BP 161/71 | HR 83

## 2010-12-21 DIAGNOSIS — I6529 Occlusion and stenosis of unspecified carotid artery: Secondary | ICD-10-CM

## 2010-12-21 NOTE — Progress Notes (Signed)
CC: tight left carotid stenosis  HPI: [name] is a 76 y.o. male who was referred by Dr.Terry Reuel Boom with a tight left carotid stenosis. He had previously been followed in our office with peripheral vascular disease and I have not seen him since November of 2009. He did have a carotid duplex scan in our office on 04/14/2010 which showed a 60-79% left carotid stenosis with no significant stenosis on the right. On his most recent followup duplex scan it was noted that his carotid stenosis on the left at progressed to greater than 80% and he sent for vascular consultation.  Patient denies any history of stroke, TIAs, expressive or receptive aphasia, or amaurosis fugax. Of note he is right-handed. Does have a history of hypertension hyperlipidemia and diabetes all of which are stable on his current medications.  Past Medical History  Diagnosis Date  . Diabetes mellitus   . Hypertension   . Hyperlipidemia   . CAD (coronary artery disease)   . Leg pain     with walking  . Carotid artery occlusion     FAMILY HISTORY: Family History  Problem Relation Age of Onset  . Hypertension Mother   . COPD Father   . Heart disease Sister   . Heart disease Brother   . Heart disease Brother     SOCIAL HISTORY: History  Substance Use Topics  . Smoking status: Former Smoker    Types: Cigarettes    Quit date: 04/17/1981  . Smokeless tobacco: Not on file  . Alcohol Use: No    Allergies  Allergen Reactions  . Omnipaque (Iohexol)   . Codeine Nausea And Vomiting    Current Outpatient Prescriptions  Medication Sig Dispense Refill  . aspirin 81 MG tablet Take 81 mg by mouth daily.        Marland Kitchen atenolol (TENORMIN) 50 MG tablet Take 50 mg by mouth daily.        Marland Kitchen lisinopril-hydrochlorothiazide (PRINZIDE,ZESTORETIC) 20-12.5 MG per tablet Take 1 tablet by mouth daily.        . metFORMIN (GLUMETZA) 500 MG (MOD) 24 hr tablet Take 500 mg by mouth 4 (four) times daily.        . simvastatin (ZOCOR) 80  MG tablet Take 80 mg by mouth at bedtime.          REVIEW OF SYSTEMS: CARDIOVASCULAR: No chest pain, chest pressure, palpitations, orthopnea, or dyspnea on exertion. No claudication or rest pain. No history of DVT or phlebitis. PULMONARY: No productive cough, asthma, or wheezing. NEUROLOGIC: No weakness, paresthesias, aphasia, amaurosis, or dizziness. HEMATOLOGIC: No bleeding problems or clotting disorders. MUSCULOSKELETAL: No joint pain or swelling. GASTROINTESTINAL: No blood in stool or hematemesis. GENITOURINARY: No dysuria or hematuria. PSYCHIATRIC: No history of major depression. INTEGUMENTARY: No rashes or ulcers. CONSTITUTIONAL: No fever or chills.  PHYSICAL EXAM: Filed Vitals:   12/21/10 1536  BP: 161/71  Pulse: 83   There is no height or weight on file to calculate BMI.  GENERAL: The patient appears their stated age. The vital signs are documented above. CARDIOVASCULAR: There is a regular rate and rhythm without significant murmur appreciated. The patient has bilateral carotid bruits. He has palpable femoral pulses with warm well-perfused feet. PULMONARY: There is good air exchange bilaterally without wheezing or rales. ABDOMEN: Soft and non-tender with normal pitched bowel sounds.  MUSCULOSKELETAL: There are no major deformities or cyanosis. NEUROLOGIC: No focal weakness or paresthesias are detected. SKIN: There are no ulcers or rashes noted. PSYCHIATRIC: The patient has a  normal affect.  DATA: 1. have independently reviewed her carotid duplex scan from our office which shows a greater than 80% left carotid stenosis with no significant stenosis on the right. He also has occluded right vertebral artery. Arm pressures are essentially equal. 2. I have also reviewed his records from Dr.Daniel's office. Patient has been having problems with low back pain and is followed by Dr. Trey Sailors. He is actually scheduled to have redo back surgery at the end of September. I've also  reviewed the carotid duplex scan which had been obtained at the outlying institution which also showed a greater than 80% left carotid stenosis based on the loss of the criteria.   MEDICAL ISSUES: 1. This patient has an asymptomatic greater than 80% left carotid stenosis. I have recommended left carotid endarterectomy.  I have reviewed the indications for carotid endarterectomy, that is to lower the risk of future stroke. I have also reviewed the potential complications of surgery, including but not limited to: bleeding, stroke (perioperative risk 1-2%), MI, nerve injury of other unpredictable medical problems. All of the patients questions were answered and they are agreeable to proceed with surgery. I would recommend that his carotid surgery be done prior to his redo back surgery in order to lower his risk of perioperative stroke during his back surgery. He will discuss this with Dr.Roy. He does know to continue taking his aspirin right up through surgery. His wife is leaving on a trip and so the surgery is scheduled until 01/10/2011.

## 2010-12-28 NOTE — Procedures (Unsigned)
CAROTID DUPLEX EXAM  INDICATION:  Follow up known carotid artery disease.  HISTORY: Diabetes:  Yes. Cardiac:  Coronary artery disease. Hypertension:  Yes. Smoking:  No. Previous Surgery:  No. CV History:  Currently asymptomatic. Amaurosis Fugax No, Paresthesias No, Hemiparesis No. Other:  Hyperlipidemia.                                      RIGHT             LEFT Brachial systolic pressure:         152               154 Brachial Doppler waveforms:         Normal            Normal Vertebral direction of flow:        Occluded          Antegrade DUPLEX VELOCITIES (cm/sec) CCA peak systolic                   79                77 ECA peak systolic                   131               216 ICA peak systolic                   110               352 ICA end diastolic                   39                125 PLAQUE MORPHOLOGY:                  Heterogenous      Mixed PLAQUE AMOUNT:                      Mild              Severe PLAQUE LOCATION:                    CCA, ICA, ECA     CCA, ICA, ECA  IMPRESSION: 1. Right internal carotid artery velocities suggest 1% to 39%     stenosis. 2. Left internal carotid artery velocities suggest 80% to 99%     stenosis. 3. Occluded right vertebral artery.  ___________________________________________ Di Kindle. Edilia Bo, M.D.  EM/MEDQ  D:  12/21/2010  T:  12/21/2010  Job:  [id number]

## 2011-01-03 ENCOUNTER — Encounter (HOSPITAL_COMMUNITY)
Admission: RE | Admit: 2011-01-03 | Discharge: 2011-01-03 | Disposition: A | Discharge status: Home / Self Care | Payer: Medicare Other | Source: Ambulatory Visit | Attending: Vascular Surgery | Admitting: Vascular Surgery

## 2011-01-03 ENCOUNTER — Other Ambulatory Visit: Payer: Self-pay | Admitting: Vascular Surgery

## 2011-01-03 DIAGNOSIS — I6522 Occlusion and stenosis of left carotid artery: Secondary | ICD-10-CM

## 2011-01-03 LAB — URINALYSIS, ROUTINE W REFLEX MICROSCOPIC
Glucose, UA: NEGATIVE mg/dL
Ketones, ur: NEGATIVE mg/dL
Leukocytes, UA: NEGATIVE
Nitrite: NEGATIVE
Protein, ur: NEGATIVE mg/dL

## 2011-01-03 LAB — COMPREHENSIVE METABOLIC PANEL
ALT: 26 U/L (ref 0–53)
AST: 25 U/L (ref 0–37)
CO2: 26 mEq/L (ref 19–32)
Calcium: 9.8 mg/dL (ref 8.4–10.5)
Chloride: 106 mEq/L (ref 96–112)
Creatinine, Ser: 1.31 mg/dL (ref 0.50–1.35)
GFR calc Af Amer: >60 mL/min (ref >60)
GFR calc non Af Amer: 53 mL/min — ABNORMAL LOW (ref >60)
Glucose, Bld: 118 mg/dL — ABNORMAL HIGH (ref 70–99)
Total Bilirubin: 0.4 mg/dL (ref 0.3–1.2)

## 2011-01-03 LAB — ABO/RH: ABO/RH(D): A POS

## 2011-01-03 LAB — TYPE AND SCREEN: ABO/RH(D): A POS

## 2011-01-03 LAB — PROTIME-INR: Prothrombin Time: 12.8 seconds (ref 11.6–15.2)

## 2011-01-03 LAB — CBC
Hemoglobin: 11.9 g/dL — ABNORMAL LOW (ref 13.0–17.0)
MCH: 28.2 pg (ref 26.0–34.0)
MCHC: 34.1 g/dL (ref 30.0–36.0)
Platelets: 187 10*3/uL (ref 150–400)
RBC: 4.22 MIL/uL (ref 4.22–5.81)

## 2011-01-10 ENCOUNTER — Inpatient Hospital Stay (HOSPITAL_COMMUNITY)
Admission: RE | Admit: 2011-01-10 | Discharge: 2011-01-12 | DRG: 039 | Disposition: A | Discharge status: Home / Self Care | Payer: Medicare Other | Source: Ambulatory Visit | Attending: Vascular Surgery | Admitting: Vascular Surgery

## 2011-01-10 ENCOUNTER — Other Ambulatory Visit: Payer: Self-pay | Admitting: Vascular Surgery

## 2011-01-10 DIAGNOSIS — Z7982 Long term (current) use of aspirin: Secondary | ICD-10-CM

## 2011-01-10 DIAGNOSIS — I6529 Occlusion and stenosis of unspecified carotid artery: Principal | ICD-10-CM | POA: Diagnosis present

## 2011-01-10 DIAGNOSIS — Z01812 Encounter for preprocedural laboratory examination: Secondary | ICD-10-CM

## 2011-01-10 DIAGNOSIS — I658 Occlusion and stenosis of other precerebral arteries: Secondary | ICD-10-CM | POA: Diagnosis present

## 2011-01-10 DIAGNOSIS — Z01818 Encounter for other preprocedural examination: Secondary | ICD-10-CM

## 2011-01-10 DIAGNOSIS — I2510 Atherosclerotic heart disease of native coronary artery without angina pectoris: Secondary | ICD-10-CM | POA: Diagnosis present

## 2011-01-10 DIAGNOSIS — E785 Hyperlipidemia, unspecified: Secondary | ICD-10-CM | POA: Diagnosis present

## 2011-01-10 DIAGNOSIS — I10 Essential (primary) hypertension: Secondary | ICD-10-CM | POA: Diagnosis present

## 2011-01-10 DIAGNOSIS — Z87891 Personal history of nicotine dependence: Secondary | ICD-10-CM

## 2011-01-10 DIAGNOSIS — E119 Type 2 diabetes mellitus without complications: Secondary | ICD-10-CM | POA: Diagnosis present

## 2011-01-10 DIAGNOSIS — Z79899 Other long term (current) drug therapy: Secondary | ICD-10-CM

## 2011-01-10 DIAGNOSIS — Z23 Encounter for immunization: Secondary | ICD-10-CM

## 2011-01-10 HISTORY — PX: CAROTID ENDARTERECTOMY: SUR193

## 2011-01-10 LAB — GLUCOSE, CAPILLARY
Glucose-Capillary: 178 mg/dL — ABNORMAL HIGH (ref 70–99)
Glucose-Capillary: 268 mg/dL — ABNORMAL HIGH (ref 70–99)
Glucose-Capillary: 194 mg/dL — ABNORMAL HIGH (ref 70–99)

## 2011-01-10 NOTE — Op Note (Signed)
[name], [name]           ACCOUNT NO.:  [id number]  MEDICAL RECORD NO.:  [id number]  LOCATION:  2550                         FACILITY:  [facility]  PHYSICIAN:  Di Kindle. Edilia Bo, M.D.DATE OF BIRTH:  [birth year]  DATE OF PROCEDURE: DATE OF DISCHARGE:                              OPERATIVE REPORT   PREOPERATIVE DIAGNOSIS:  Asymptomatic greater than 80% left carotid stenosis.  POSTOPERATIVE DIAGNOSIS:  Asymptomatic greater than 80% left carotid stenosis.  PROCEDURE:  Left carotid endarterectomy with Dacron patch angioplasty.  SURGEON:  Di Kindle. Edilia Bo, MD  ASSISTANT:  Newton Pigg, PA  INDICATIONS:  This is a 76 year old gentleman who had a moderate left carotid stenosis on a followup study.  This had progressed to greater than 80% and left carotid endarterectomy was recommended in order to lower his risk of future stroke.  TECHNIQUE:  The patient was taken to the operating room, received a general anesthetic.  The left neck was prepped and draped in usual sterile fashion.  An incision was made along the anterior border of the sternocleidomastoid and dissection carried down to the common carotid artery which was dissected free and controlled with Rumel tourniquet. Facial vein was divided between 2-0 silk ties.  The internal carotid artery was controlled above the plaque and the external and superior thyroid arteries were controlled.  The patient was then heparinized with 8000 units of IV heparin.  After the heparin had adequately circulated, the internal, the external, and the common carotid artery were clamped. A longitudinal arteriotomy was made in the common carotid artery and this was extended to the plaque into the internal carotid artery above the plaque.  A 10 shunt was placed into the internal carotid artery, back bled, and then placed in the common carotid artery secured with Rumel tourniquet.  Flow was reestablished to the shunt.   An endarterectomy plane was established proximally and the plaque was sharply divided.  Eversion endarterectomy was performed of the external carotid artery.  Distally there was a nice taper in the internal carotid artery plaque and no tacking sutures were required.  The artery was irrigated with copious amounts of dextran and heparin and all loose debris removed.  The Dacron patch was then sewn using continuous 6-0 Prolene suture.  Prior to completing the patch closure, the shunt was removed.  The arteries were back bled and flushed appropriately and the anastomosis completed.  Flow was reestablished first to the external carotid artery and then into the internal carotid artery.  There was an excellent pulse and Doppler signal distal to the patch at the completion.  The heparin was partially reversed with 30 mg of protamine. The wound was then closed with deep layer of 3-0 Vicryl.  The platysma was closed with running 3-0 Vicryl.  The skin was closed with 4-0 subcuticular stitch.  A sterile dressing was applied.  The patient tolerated the procedure well and awoke neurologically intact.  All needle and sponge counts were correct.     Di Kindle. Edilia Bo, M.D.     CSD/MEDQ  D:  01/10/2011  T:  01/10/2011  Job:  [id number]  cc:   Donzetta Sprung, MD Payton Doughty, M.D.  Electronically Signed by Reita Chard.D.  on 01/10/2011 01:59:41 PM

## 2011-01-11 LAB — CBC
MCHC: 34 g/dL (ref 30.0–36.0)
Platelets: 196 10*3/uL (ref 150–400)
RDW: 12.9 % (ref 11.5–15.5)
WBC: 11.5 10*3/uL — ABNORMAL HIGH (ref 4.0–10.5)

## 2011-01-11 LAB — GLUCOSE, CAPILLARY
Glucose-Capillary: 275 mg/dL — ABNORMAL HIGH (ref 70–99)
Glucose-Capillary: 68 mg/dL — ABNORMAL LOW (ref 70–99)
Glucose-Capillary: 125 mg/dL — ABNORMAL HIGH (ref 70–99)

## 2011-01-11 LAB — BASIC METABOLIC PANEL
BUN: 11 mg/dL (ref 6–23)
Chloride: 106 mEq/L (ref 96–112)
GFR calc Af Amer: >60 mL/min (ref >60)
GFR calc non Af Amer: >60 mL/min (ref >60)
Potassium: 4.7 mEq/L (ref 3.5–5.1)
Sodium: 138 mEq/L (ref 135–145)

## 2011-01-11 LAB — HEMOGLOBIN A1C: Mean Plasma Glucose: 134 mg/dL — ABNORMAL HIGH (ref <117)

## 2011-01-24 ENCOUNTER — Encounter: Payer: Self-pay | Admitting: Vascular Surgery

## 2011-01-25 ENCOUNTER — Ambulatory Visit (INDEPENDENT_AMBULATORY_CARE_PROVIDER_SITE_OTHER): Payer: Medicare Other | Admitting: Vascular Surgery

## 2011-01-25 ENCOUNTER — Encounter: Payer: Self-pay | Admitting: Vascular Surgery

## 2011-01-25 VITALS — BP 163/76 | HR 64 | Temp 98.2°F | Resp 16 | Ht 67.0 in | Wt 184.0 lb

## 2011-01-25 DIAGNOSIS — I6529 Occlusion and stenosis of unspecified carotid artery: Secondary | ICD-10-CM

## 2011-01-25 DIAGNOSIS — Z9889 Other specified postprocedural states: Secondary | ICD-10-CM

## 2011-01-25 NOTE — Progress Notes (Signed)
[facility]  Patient name: [name] MRN: [id number] DOB: [birth year] Sex: male  CC: F/U after left carotid endarterectomy  HPI: [name] is a 76 y.o. male who underwent a left carotid endarterectomy on 01/10/2011. He returns for his first followup visit. He has had no specific complaints. Specifically he denies weakness, paresthesias, expressive or receptive aphasia.  Past Medical History  Diagnosis Date  . Diabetes mellitus   . Hypertension   . Hyperlipidemia   . CAD (coronary artery disease)   . Leg pain     with walking  . Carotid artery occlusion     Family History  Problem Relation Age of Onset  . Hypertension Mother   . COPD Father   . Heart disease Sister   . Heart disease Brother   . Heart disease Brother     SOCIAL HISTORY: History  Substance Use Topics  . Smoking status: Former Smoker    Types: Cigarettes    Quit date: 04/17/1981  . Smokeless tobacco: Not on file  . Alcohol Use: No    Allergies  Allergen Reactions  . Omnipaque (Iohexol)   . Codeine Nausea And Vomiting    Current Outpatient Prescriptions  Medication Sig Dispense Refill  . aspirin 81 MG tablet Take 81 mg by mouth daily.        Marland Kitchen atenolol (TENORMIN) 50 MG tablet Take 50 mg by mouth daily.        Marland Kitchen lisinopril-hydrochlorothiazide (PRINZIDE,ZESTORETIC) 20-12.5 MG per tablet Take 1 tablet by mouth daily.        . metFORMIN (GLUMETZA) 500 MG (MOD) 24 hr tablet Take 500 mg by mouth 4 (four) times daily.        . mupirocin (BACTROBAN) 2 % ointment       . promethazine (PHENERGAN) 25 MG tablet       . simvastatin (ZOCOR) 80 MG tablet Take 80 mg by mouth at bedtime.          REVIEW OF SYSTEMS: Arly.Keller ] denotes positive finding; [  ] denotes negative finding CARDIOVASCULAR:  [ ]  chest pain   [ ]  chest pressure   [ ]  palpitations   [ ]  orthopnea   [ ]  dyspnea on exertion   [ ]  claudication   [ ]  rest pain   [ ]  DVT   [ ]  phlebitis PULMONARY:   [ ]   productive cough   [ ]  asthma   [ ]  wheezing NEUROLOGIC:   [ ]  weakness  [ ]  paresthesias  [ ]  aphasia  [ ]  amaurosis  [ ]  dizziness HEMATOLOGIC:   [ ]  bleeding problems   [ ]  clotting disorders MUSCULOSKELETAL:  [ ]  joint pain   [ ]  joint swelling GASTROINTESTINAL: [ ]   blood in stool  [ ]   hematemesis GENITOURINARY:  [ ]   dysuria  [ ]   hematuria PSYCHIATRIC:  [ ]  history of major depression INTEGUMENTARY:  [ ]  rashes  [ ]  ulcers CONSTITUTIONAL:  [ ]  fever   [ ]  chills  PHYSICAL EXAM: Filed Vitals:   01/25/11 0856 01/25/11 0857  BP: 180/77 163/76  Pulse: 64 64  Temp: 98.2 F (36.8 C)   TempSrc: Oral   Resp: 16   Height: 5\' 7"  (1.702 m)   Weight: 184 lb (83.462 kg)    Body mass index is 28.82 kg/(m^2). GENERAL: The patient is a well-nourished male, in no acute distress. The vital signs are documented above. CARDIOVASCULAR: There is a regular rate  and rhythm without significant murmur appreciated.  PULMONARY: There is good air exchange bilaterally without wheezing or rales. MUSCULOSKELETAL: There are no major deformities or cyanosis. NEUROLOGIC: No focal weakness or paresthesias are detected. SKIN: his incision on the left neck is healing nicely.  MEDICAL ISSUES: The patient is doing well status post left carotid endarterectomy. I have ordered a followup carotid duplex scan in 6 months and I'll see him back at that time. He knows to call sooner if he has problems. In the meantime he knows to continue taking his aspirin.  Jeana Kersting S [facility] [place] Office: [phone]

## 2011-02-06 NOTE — Discharge Summary (Signed)
[name], [name]           ACCOUNT NO.:  [id number]  MEDICAL RECORD NO.:  [id number]  LOCATION:  3309                         FACILITY:  [facility]  PHYSICIAN:  Di Kindle. Edilia Bo, M.D.DATE OF BIRTH:  1934-04-23  DATE OF ADMISSION:  01/10/2011 DATE OF DISCHARGE:  01/12/2011                              DISCHARGE SUMMARY   ADMITTING DIAGNOSIS:  Left internal carotid artery stenosis.  PAST MEDICAL HISTORY AND DISCHARGE DIAGNOSES: 1. Left carotid stenosis status post left carotid endarterectomy. 2. Diabetes mellitus. 3. Hypertension. 4. Hyperlipidemia. 5. Coronary artery disease. 6. Leg pain with walking.  BRIEF HISTORY:  The patient is a 76 year old male who had a moderate left carotid stenosis on followup study.  This has progressed to greater than 80% and left carotid endarterectomy was recommended for stroke risk reduction.  HOSPITAL COURSE:  The patient was admitted and taken to the OR on January 10, 2011 for a left carotid endarterectomy with Dacron patch angioplasty.  The patient tolerated the procedure well and was hemodynamically stable immediately postoperatively.  He was transferred from the OR to the postanesthesia care unit in stable condition.  The patient was extubated without complication and woke up from anesthesia neurologically intact.  The patient's early postoperative course was complicated by hypotension. He was started on dopamine and had to be maintained on this through the morning of postoperative day 2.  His dopamine was subsequently weaned and discontinued and he was able to maintain an adequate blood pressure prior to discharge.  He remained afebrile and with otherwise stable vital signs throughout the hospital course.  His Foley was discontinued without difficulty.  He was able to ambulate and tolerate a regular diet also without difficulty.  On January 12, 2011, he was without complaint.  He was ambulating without  difficulty.  PHYSICAL EXAMINATION:  CARDIAC:  Regular rate and rhythm. LUNGS:  Clear to auscultation. INCISION:  Clean, dry, and intact with no evidence of hematoma. NEUROLOGIC:  Intact.  The patient was doing well with a stable blood pressure.  His blood pressure medications had been held and were going to continue to be held until he follows up with his primary care physician as an outpatient. The patient was ready for discharge.  LABORATORIES:  CBC and BMP on January 11, 2011, white count 11.5, hemoglobin 10.3, hematocrit 30.3, platelets 196.  Sodium 138, potassium 4.7, BUN 11, creatinine 4.16.  The patient received specific written discharge instructions regarding diet, activity, wound care.  The patient is to follow up with Dr. Edilia Bo in 2 weeks after discharge.  DISCHARGE MEDICATIONS: 1. Aspirin 81 mg daily. 2. Trazodone 50 mg at bedtime. 3. Omeprazole 20 mg daily. 4. Atenolol 50 mg daily. 5. Lisinopril/hydrochlorothiazide 20/12.5 mg daily. 6. Glipizide 5 mg daily. 7. Simvastatin 80 mg daily. 8. Metformin 500 mg 2 tabs b.i.d. 9. The patient was instructed to hold atenolol and lisinopril until     follow up with his primary care physician for recheck of his blood     pressure.     Pecola Leisure, PA   ______________________________ Di Kindle. Edilia Bo, M.D.    AY/MEDQ  D:  01/23/2011  T:  01/24/2011  Job:  [id number]  Electronically Signed by Marchelle Folks  [facility] PA on 02/03/2011 09:42:05 AM Electronically Signed by Waverly Ferrari M.D. on 02/06/2011 02:02:44 PM

## 2011-07-10 HISTORY — PX: BACK SURGERY: SHX140

## 2011-07-10 HISTORY — PX: SPINE SURGERY: SHX786

## 2011-07-26 ENCOUNTER — Ambulatory Visit: Payer: Medicare Other | Admitting: Vascular Surgery

## 2011-07-26 ENCOUNTER — Other Ambulatory Visit: Payer: Medicare Other

## 2011-08-29 ENCOUNTER — Encounter: Payer: Self-pay | Admitting: Vascular Surgery

## 2011-08-30 ENCOUNTER — Encounter: Payer: Self-pay | Admitting: Vascular Surgery

## 2011-08-30 ENCOUNTER — Other Ambulatory Visit (INDEPENDENT_AMBULATORY_CARE_PROVIDER_SITE_OTHER): Payer: MEDICARE | Admitting: *Deleted

## 2011-08-30 ENCOUNTER — Ambulatory Visit (INDEPENDENT_AMBULATORY_CARE_PROVIDER_SITE_OTHER): Payer: MEDICARE | Admitting: Vascular Surgery

## 2011-08-30 VITALS — BP 145/63 | HR 63 | Temp 98.2°F | Resp 16 | Ht 67.0 in | Wt 180.8 lb

## 2011-08-30 DIAGNOSIS — I6529 Occlusion and stenosis of unspecified carotid artery: Secondary | ICD-10-CM

## 2011-08-30 DIAGNOSIS — Z48812 Encounter for surgical aftercare following surgery on the circulatory system: Secondary | ICD-10-CM

## 2011-08-30 DIAGNOSIS — Z9889 Other specified postprocedural states: Secondary | ICD-10-CM

## 2011-08-30 NOTE — Assessment & Plan Note (Signed)
The patient's left carotid endarterectomy site is widely patent and he has no significant carotid stenosis on the right. He is not a smoker and he remains on his aspirin. I've ordered a carotid duplex scan in 1 year. I'll plan on seeing him back in 2 yearsunless there is any significant change in his carotid duplex scan next year. He'll continue taking his aspirin and knows to call sooner if he has any problems.

## 2011-08-30 NOTE — Progress Notes (Signed)
Vascular and Vein Specialist of [facility]  Patient name: [name] MRN: [id number] DOB: 14-May-1934 Sex: male  REASON FOR VISIT: follow up of carotid disease  HPI: [name] is a 77 y.o. male who underwent a left carotid endarterectomy in September 2012. He comes in for yearly follow up visit. Since I saw him last she's had no history of stroke, TIAs, expressive or receptive aphasia, or amaurosis fugax. His been no significant change in his medical history except that he said back surgery and has had an excellent result.  Past Medical History  Diagnosis Date  . Diabetes mellitus   . Hypertension   . Hyperlipidemia   . CAD (coronary artery disease)   . Leg pain     with walking  . Carotid artery occlusion    SOCIAL HISTORY: History  Substance Use Topics  . Smoking status: Former Smoker    Types: Cigarettes    Quit date: 04/17/1981  . Smokeless tobacco: Not on file  . Alcohol Use: No   Current Outpatient Prescriptions  Medication Sig Dispense Refill  . aspirin 81 MG tablet Take 81 mg by mouth daily.        Marland Kitchen atenolol (TENORMIN) 50 MG tablet Take 50 mg by mouth daily.        Marland Kitchen gabapentin (NEURONTIN) 300 MG capsule Take 300 mg by mouth BID times 48H.      Marland Kitchen GLIPIZIDE XL 5 MG 24 hr tablet Take 5 mg by mouth Daily.      Marland Kitchen lisinopril-hydrochlorothiazide (PRINZIDE,ZESTORETIC) 20-12.5 MG per tablet Take 1 tablet by mouth daily.        . metFORMIN (GLUMETZA) 500 MG (MOD) 24 hr tablet Take 500 mg by mouth 4 (four) times daily.        . mupirocin (BACTROBAN) 2 % ointment       . omeprazole (PRILOSEC) 20 MG capsule Take 20 mg by mouth Daily.      Marland Kitchen oxyCODONE-acetaminophen (PERCOCET) 10-325 MG per tablet Take 10-325 mg by mouth Three times a day. Two Tabs Tid      . promethazine (PHENERGAN) 25 MG tablet       . simvastatin (ZOCOR) 80 MG tablet Take 80 mg by mouth at bedtime.        . traZODone (DESYREL) 50 MG tablet Take 50 mg by mouth At bedtime as needed.        REVIEW OF  SYSTEMS: Arly.Keller ] denotes positive finding; [  ] denotes negative finding  CARDIOVASCULAR:  [ ]  chest pain   [ ]  chest pressure   [ ]  palpitations   [ ]  orthopnea   [ ]  dyspnea on exertion   [ ]  claudication   [ ]  rest pain   [ ]  DVT   [ ]  phlebitis PULMONARY:   [ ]  productive cough   [ ]  asthma   [ ]  wheezing NEUROLOGIC:   [ ]  weakness  [ ]  paresthesias  [ ]  aphasia  [ ]  amaurosis  [ ]  dizziness HEMATOLOGIC:   [ ]  bleeding problems   [ ]  clotting disorders MUSCULOSKELETAL:  [ ]  joint pain   [ ]  joint swelling [ ]  leg swelling GASTROINTESTINAL: [ ]   blood in stool  [ ]   hematemesis GENITOURINARY:  [ ]   dysuria  [ ]   hematuria CONSTITUTIONAL:  [ ]  fever   [ ]  chills  PHYSICAL EXAM: Filed Vitals:   08/30/11 1500 08/30/11 1501  BP: 146/71 145/63  Pulse: 62 63  Temp: 98.2  F (36.8 C) 98.2 F (36.8 C)  TempSrc: Oral Oral  Resp: 16 16  Height: 5\' 7"  (1.702 m) 5\' 7"  (1.702 m)  Weight: 180 lb 12.8 oz (82.01 kg) 180 lb 12.8 oz (82.01 kg)  SpO2: 100% 100%   Body mass index is 28.32 kg/(m^2). GENERAL: The patient is a well-nourished male, in no acute distress. The vital signs are documented above. CARDIOVASCULAR: There is a regular rate and rhythm without significant murmur appreciated. I do not detect carotid bruits. He has no significant lower extremity swelling. PULMONARY: There is good air exchange bilaterally without wheezing or rales. ABDOMEN: Soft and non-tender with normal pitched bowel sounds.  MUSCULOSKELETAL: There are no major deformities or cyanosis. NEUROLOGIC: No focal weakness or paresthesias are detected. SKIN: There are no ulcers or rashes noted. PSYCHIATRIC: The patient has a normal affect.  DATA:  I have independently interpreted his carotid duplex scan which shows no evidence of recurrent carotid stenosis on the left. The right carotid has a less than 39% stenosis. The right vertebral artery is occluded.  MEDICAL ISSUES:  Occlusion and stenosis of carotid artery without  mention of cerebral infarction The patient's left carotid endarterectomy site is widely patent and he has no significant carotid stenosis on the right. He is not a smoker and he remains on his aspirin. I've ordered a carotid duplex scan in 1 year. I'll plan on seeing him back in 2 yearsunless there is any significant change in his carotid duplex scan next year. He'll continue taking his aspirin and knows to call sooner if he has any problems.    Gennesis Hogland S [facility] [place] [facility]: [phone]

## 2011-09-07 NOTE — Procedures (Unsigned)
CAROTID DUPLEX EXAM  INDICATION:  Carotid disease.  HISTORY: Diabetes:  Yes. Cardiac:  Coronary artery disease. Hypertension:  Yes. Smoking:  No. Previous Surgery:  Left CEA with DPA 01/10/2011. CV History:  Currently asymptomatic. Amaurosis Fugax No, Paresthesias No, Hemiparesis No                                      RIGHT             LEFT Brachial systolic pressure:         114               119 Brachial Doppler waveforms:         Biphasic          Within normal limits Vertebral direction of flow:        Occluded          Antegrade DUPLEX VELOCITIES (cm/sec) CCA peak systolic                   90                76 ECA peak systolic                   144               132 ICA peak systolic                   111               115 ICA end diastolic                   33                30 PLAQUE MORPHOLOGY:                  Heterogenous PLAQUE AMOUNT:                      Minimal           None PLAQUE LOCATION:                    CCA/ICA/ECA  IMPRESSION: 1. Right internal carotid artery velocity suggests 1-39% stenosis. 2. Patent left carotid endarterectomy site with no evidence of re-     stenosis. 3. Occluded right vertebral artery.  ___________________________________________ Di Kindle. Edilia Bo, M.D.  EM/MEDQ  D:  08/31/2011  T:  08/31/2011  Job:  [id number]

## 2012-04-26 IMAGING — CR DG CHEST 2V
2 series · 2 of 2 positions shown · non-contrast
Comparison: None.

CLINICAL DATA: 76-year-old male preoperative study for carotid
endarterectomy.

CHEST - 2 VIEW

[view not recorded (1 of 2)]
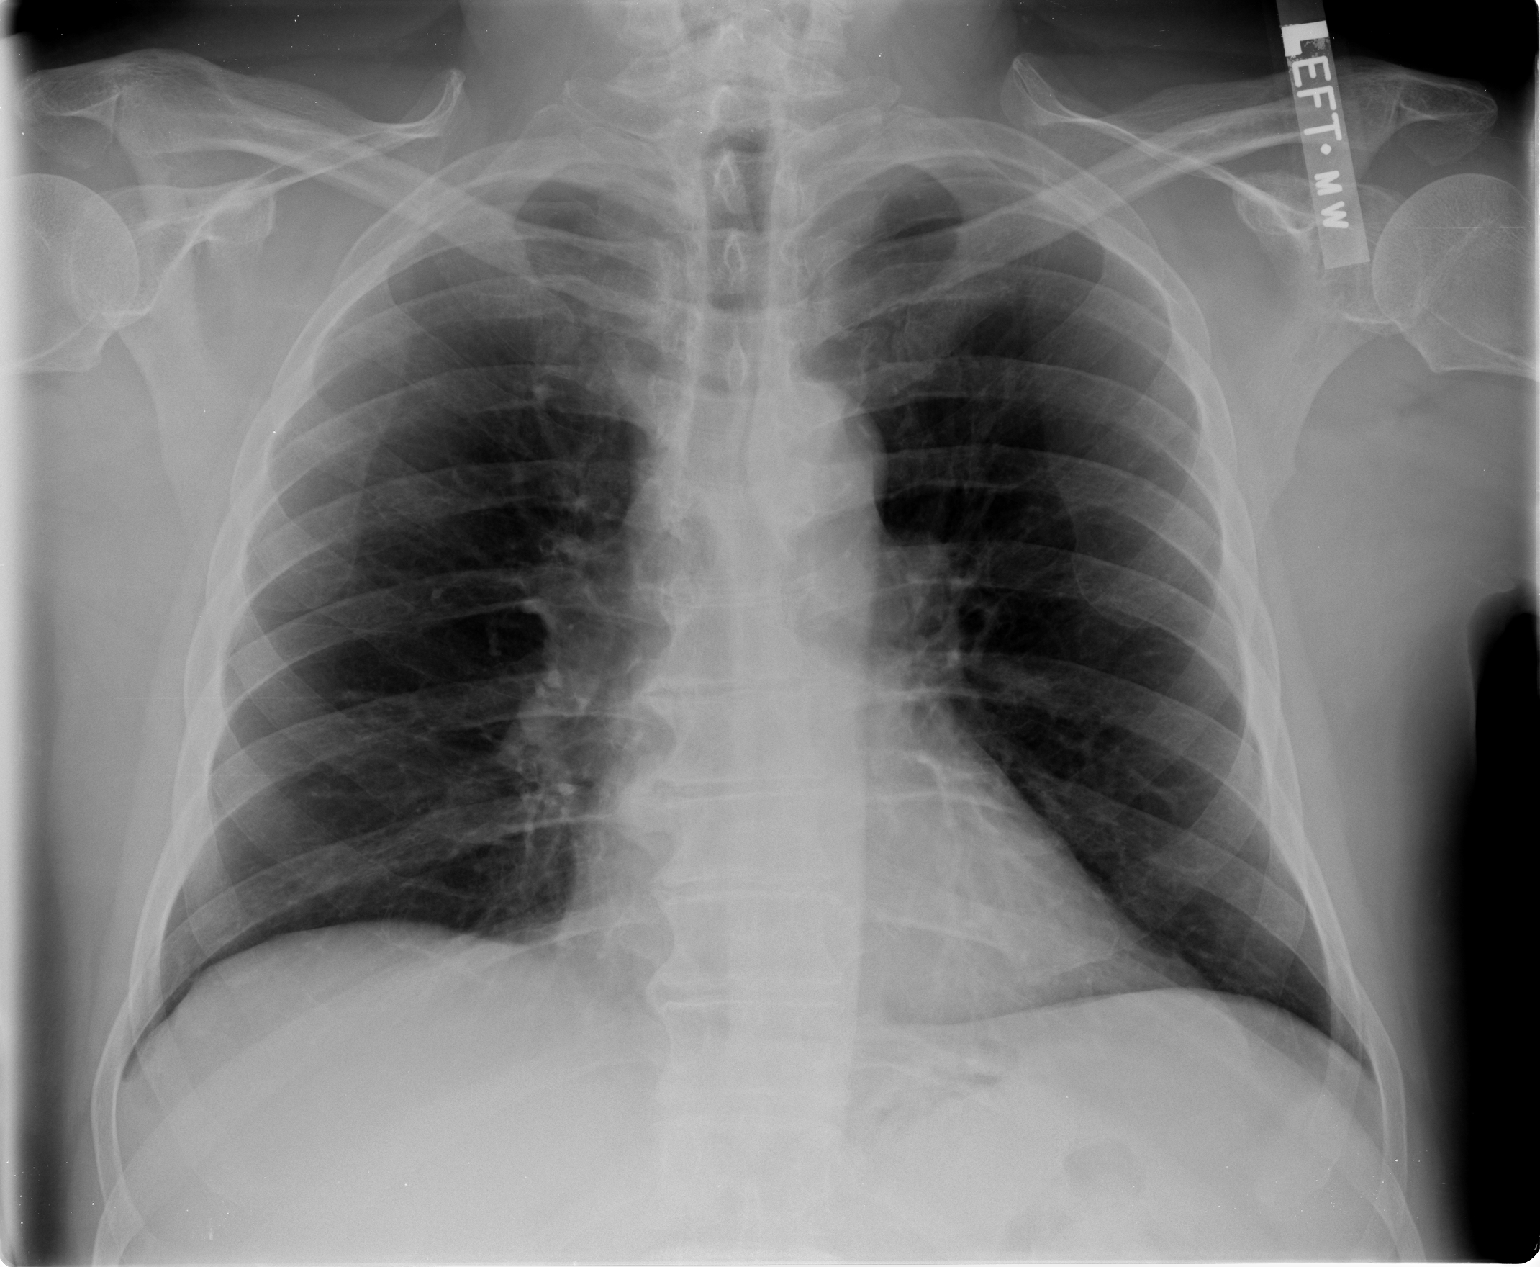

[view not recorded (2 of 2)]
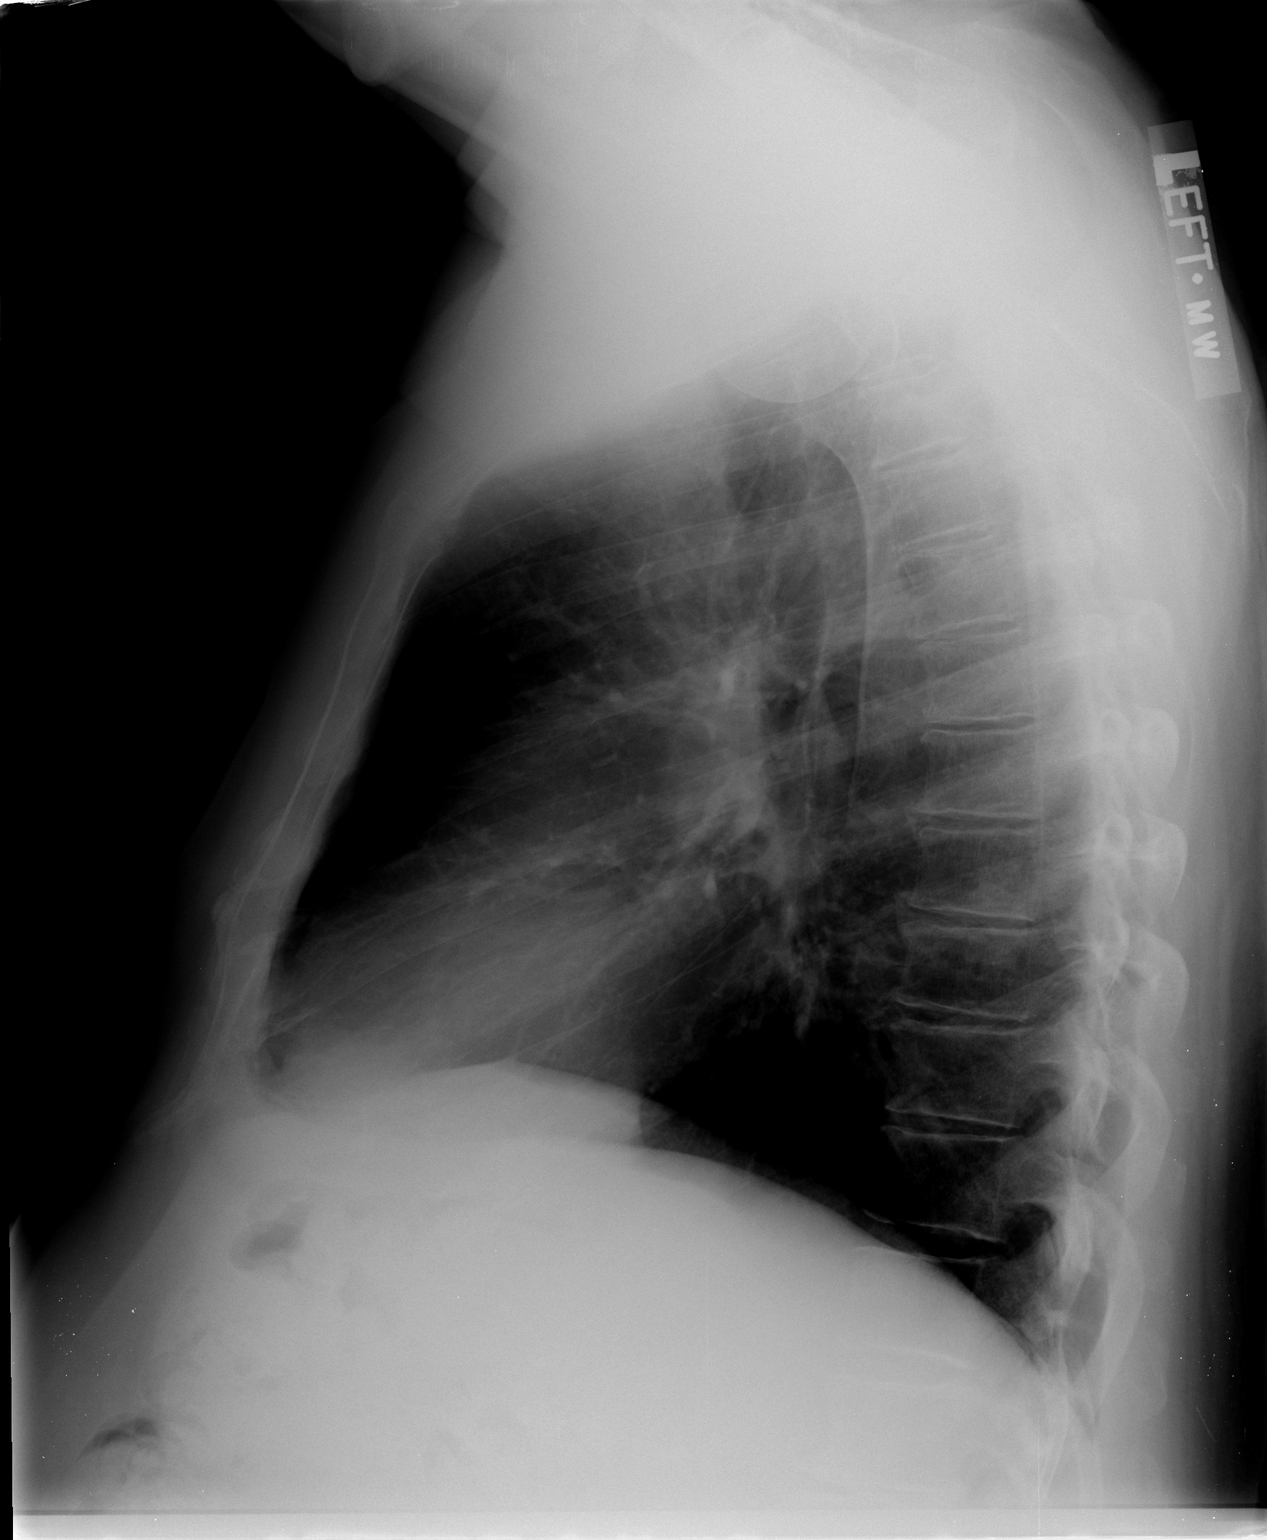

[2 of 2 positions shown; findings below may reference images not displayed]

FINDINGS: Lung volumes within normal limits.  Cardiac size and
mediastinal contours are within normal limits.  Visualized tracheal
air column is within normal limits.  Lungs are clear.  No
pneumothorax or effusion. No acute osseous abnormality identified.
IMPRESSION: Negative, no acute cardiopulmonary abnormality.

## 2012-09-10 ENCOUNTER — Other Ambulatory Visit: Payer: MEDICARE

## 2013-05-05 ENCOUNTER — Inpatient Hospital Stay (HOSPITAL_COMMUNITY): Payer: Medicare Other

## 2013-05-05 ENCOUNTER — Inpatient Hospital Stay (HOSPITAL_COMMUNITY)
Admission: AD | Admit: 2013-05-05 | Payer: Self-pay | Source: Other Acute Inpatient Hospital | Admitting: Internal Medicine

## 2013-05-05 ENCOUNTER — Inpatient Hospital Stay (HOSPITAL_COMMUNITY)
Admission: AD | Admit: 2013-05-05 | Discharge: 2013-05-14 | DRG: 100 | Disposition: A | Discharge status: Home / Self Care | Payer: Medicare Other | Source: Ambulatory Visit | Attending: Internal Medicine | Admitting: Internal Medicine

## 2013-05-05 ENCOUNTER — Encounter (HOSPITAL_COMMUNITY): Payer: Self-pay | Admitting: Internal Medicine

## 2013-05-05 DIAGNOSIS — R29810 Facial weakness: Secondary | ICD-10-CM | POA: Diagnosis not present

## 2013-05-05 DIAGNOSIS — I10 Essential (primary) hypertension: Secondary | ICD-10-CM

## 2013-05-05 DIAGNOSIS — J9819 Other pulmonary collapse: Secondary | ICD-10-CM | POA: Diagnosis present

## 2013-05-05 DIAGNOSIS — I2510 Atherosclerotic heart disease of native coronary artery without angina pectoris: Secondary | ICD-10-CM

## 2013-05-05 DIAGNOSIS — Z833 Family history of diabetes mellitus: Secondary | ICD-10-CM | POA: Diagnosis not present

## 2013-05-05 DIAGNOSIS — R569 Unspecified convulsions: Secondary | ICD-10-CM | POA: Diagnosis present

## 2013-05-05 DIAGNOSIS — G40309 Generalized idiopathic epilepsy and epileptic syndromes, not intractable, without status epilepticus: Secondary | ICD-10-CM | POA: Diagnosis present

## 2013-05-05 DIAGNOSIS — E119 Type 2 diabetes mellitus without complications: Secondary | ICD-10-CM | POA: Diagnosis present

## 2013-05-05 DIAGNOSIS — Z91041 Radiographic dye allergy status: Secondary | ICD-10-CM | POA: Diagnosis not present

## 2013-05-05 DIAGNOSIS — R269 Unspecified abnormalities of gait and mobility: Secondary | ICD-10-CM | POA: Diagnosis present

## 2013-05-05 DIAGNOSIS — I779 Disorder of arteries and arterioles, unspecified: Secondary | ICD-10-CM | POA: Diagnosis present

## 2013-05-05 DIAGNOSIS — I6529 Occlusion and stenosis of unspecified carotid artery: Secondary | ICD-10-CM

## 2013-05-05 DIAGNOSIS — N179 Acute kidney failure, unspecified: Secondary | ICD-10-CM | POA: Diagnosis present

## 2013-05-05 DIAGNOSIS — I6783 Posterior reversible encephalopathy syndrome: Secondary | ICD-10-CM

## 2013-05-05 DIAGNOSIS — E785 Hyperlipidemia, unspecified: Secondary | ICD-10-CM | POA: Diagnosis present

## 2013-05-05 DIAGNOSIS — Z885 Allergy status to narcotic agent status: Secondary | ICD-10-CM

## 2013-05-05 DIAGNOSIS — Z8249 Family history of ischemic heart disease and other diseases of the circulatory system: Secondary | ICD-10-CM | POA: Diagnosis not present

## 2013-05-05 DIAGNOSIS — G9340 Encephalopathy, unspecified: Secondary | ICD-10-CM | POA: Diagnosis present

## 2013-05-05 DIAGNOSIS — R443 Hallucinations, unspecified: Secondary | ICD-10-CM | POA: Diagnosis present

## 2013-05-05 DIAGNOSIS — Z7982 Long term (current) use of aspirin: Secondary | ICD-10-CM | POA: Diagnosis not present

## 2013-05-05 DIAGNOSIS — Z87891 Personal history of nicotine dependence: Secondary | ICD-10-CM | POA: Diagnosis not present

## 2013-05-05 DIAGNOSIS — G9349 Other encephalopathy: Secondary | ICD-10-CM | POA: Diagnosis present

## 2013-05-05 LAB — CBC WITH DIFFERENTIAL/PLATELET
BASOS ABS: 0.1 10*3/uL (ref 0.0–0.1)
BASOS PCT: 1 % (ref 0–1)
EOS ABS: 0.1 10*3/uL (ref 0.0–0.7)
Eosinophils Relative: 2 % (ref 0–5)
HCT: 40.8 % (ref 39.0–52.0)
Hemoglobin: 14.1 g/dL (ref 13.0–17.0)
Lymphocytes Relative: 25 % (ref 12–46)
Lymphs Abs: 2 10*3/uL (ref 0.7–4.0)
MCH: 29.1 pg (ref 26.0–34.0)
MCHC: 34.6 g/dL (ref 30.0–36.0)
MCV: 84.1 fL (ref 78.0–100.0)
Monocytes Absolute: 0.6 10*3/uL (ref 0.1–1.0)
Monocytes Relative: 8 % (ref 3–12)
Neutro Abs: 5.3 10*3/uL (ref 1.7–7.7)
Neutrophils Relative %: 65 % (ref 43–77)
PLATELETS: 130 10*3/uL — AB (ref 150–400)
RBC: 4.85 MIL/uL (ref 4.22–5.81)
RDW: 13.6 % (ref 11.5–15.5)
WBC: 8.1 10*3/uL (ref 4.0–10.5)

## 2013-05-05 LAB — URINALYSIS, ROUTINE W REFLEX MICROSCOPIC
Bilirubin Urine: NEGATIVE
Glucose, UA: NEGATIVE mg/dL
HGB URINE DIPSTICK: NEGATIVE
Ketones, ur: 15 mg/dL — AB
Leukocytes, UA: NEGATIVE
Nitrite: NEGATIVE
Protein, ur: NEGATIVE mg/dL
SPECIFIC GRAVITY, URINE: 1.003 — AB (ref 1.005–1.030)
UROBILINOGEN UA: 0.2 mg/dL (ref 0.0–1.0)
pH: 6.5 (ref 5.0–8.0)

## 2013-05-05 LAB — APTT: APTT: 22 s — AB (ref 24–37)

## 2013-05-05 LAB — PROTIME-INR
INR: 0.96 (ref 0.00–1.49)
Prothrombin Time: 12.6 seconds (ref 11.6–15.2)

## 2013-05-05 MED ORDER — ACETAMINOPHEN 325 MG PO TABS
650.0000 mg | ORAL_TABLET | Freq: Four times a day (QID) | ORAL | Status: DC | PRN
  Administered 2013-05-07 – 2013-05-14 (×11): 650 mg via ORAL
  Filled 2013-05-05 (×11): qty 2

## 2013-05-05 MED ORDER — HEPARIN SODIUM (PORCINE) 5000 UNIT/ML IJ SOLN
5000.0000 [IU] | Freq: Three times a day (TID) | INTRAMUSCULAR | Status: DC
  Administered 2013-05-05 – 2013-05-07 (×5): 5000 [IU] via SUBCUTANEOUS
  Filled 2013-05-05 (×8): qty 1

## 2013-05-05 MED ORDER — ALBUTEROL SULFATE (2.5 MG/3ML) 0.083% IN NEBU: 2.5000 mg | INHALATION_SOLUTION | RESPIRATORY_TRACT | Status: DC | PRN

## 2013-05-05 MED ORDER — LORAZEPAM 2 MG/ML IJ SOLN: 0.5000 mg | Freq: Once | INTRAMUSCULAR | Status: AC | PRN

## 2013-05-05 MED ORDER — SODIUM CHLORIDE 0.9 % IV SOLN
INTRAVENOUS | Status: AC
  Administered 2013-05-05: 1000 mL via INTRAVENOUS

## 2013-05-05 MED ORDER — INSULIN ASPART 100 UNIT/ML ~~LOC~~ SOLN
0.0000 [IU] | Freq: Three times a day (TID) | SUBCUTANEOUS | Status: DC
  Administered 2013-05-06 (×2): 2 [IU] via SUBCUTANEOUS
  Administered 2013-05-07: 5 [IU] via SUBCUTANEOUS
  Administered 2013-05-07 (×2): 3 [IU] via SUBCUTANEOUS
  Administered 2013-05-08: 2 [IU] via SUBCUTANEOUS
  Administered 2013-05-08 – 2013-05-09 (×2): 3 [IU] via SUBCUTANEOUS
  Administered 2013-05-09: 5 [IU] via SUBCUTANEOUS
  Administered 2013-05-09: 2 [IU] via SUBCUTANEOUS
  Administered 2013-05-10 – 2013-05-11 (×5): 3 [IU] via SUBCUTANEOUS
  Administered 2013-05-11: 5 [IU] via SUBCUTANEOUS
  Administered 2013-05-12 (×2): 3 [IU] via SUBCUTANEOUS
  Administered 2013-05-13: 5 [IU] via SUBCUTANEOUS
  Administered 2013-05-13 – 2013-05-14 (×3): 3 [IU] via SUBCUTANEOUS
  Administered 2013-05-14: 2 [IU] via SUBCUTANEOUS

## 2013-05-05 MED ORDER — ADULT MULTIVITAMIN W/MINERALS CH
1.0000 | ORAL_TABLET | Freq: Every day | ORAL | Status: DC
  Administered 2013-05-05 – 2013-05-14 (×10): 1 via ORAL
  Filled 2013-05-05 (×10): qty 1

## 2013-05-05 MED ORDER — ONDANSETRON HCL 4 MG PO TABS: 4.0000 mg | ORAL_TABLET | Freq: Four times a day (QID) | ORAL | Status: DC | PRN

## 2013-05-05 MED ORDER — SODIUM CHLORIDE 0.9 % IJ SOLN
3.0000 mL | Freq: Two times a day (BID) | INTRAMUSCULAR | Status: DC
  Administered 2013-05-05 – 2013-05-14 (×17): 3 mL via INTRAVENOUS

## 2013-05-05 MED ORDER — ONDANSETRON HCL 4 MG/2ML IJ SOLN: 4.0000 mg | Freq: Four times a day (QID) | INTRAMUSCULAR | Status: DC | PRN

## 2013-05-05 MED ORDER — VITAMIN B-1 100 MG PO TABS
100.0000 mg | ORAL_TABLET | Freq: Every day | ORAL | Status: DC
  Administered 2013-05-05 – 2013-05-14 (×10): 100 mg via ORAL
  Filled 2013-05-05 (×10): qty 1

## 2013-05-05 MED ORDER — LORAZEPAM 2 MG/ML IJ SOLN
1.0000 mg | INTRAMUSCULAR | Status: DC | PRN
  Administered 2013-05-08 – 2013-05-11 (×2): 1 mg via INTRAVENOUS
  Filled 2013-05-05 (×3): qty 1

## 2013-05-05 MED ORDER — INSULIN ASPART 100 UNIT/ML ~~LOC~~ SOLN: 0.0000 [IU] | Freq: Every day | SUBCUTANEOUS | Status: DC

## 2013-05-05 MED ORDER — HYDRALAZINE HCL 20 MG/ML IJ SOLN
10.0000 mg | Freq: Four times a day (QID) | INTRAMUSCULAR | Status: DC | PRN
  Administered 2013-05-12: 10 mg via INTRAVENOUS
  Filled 2013-05-05: qty 1

## 2013-05-05 MED ORDER — ACETAMINOPHEN 650 MG RE SUPP: 650.0000 mg | Freq: Four times a day (QID) | RECTAL | Status: DC | PRN

## 2013-05-05 MED ORDER — ASPIRIN EC 325 MG PO TBEC
325.0000 mg | DELAYED_RELEASE_TABLET | Freq: Every day | ORAL | Status: DC
  Administered 2013-05-05 – 2013-05-14 (×10): 325 mg via ORAL
  Filled 2013-05-05 (×10): qty 1

## 2013-05-05 NOTE — Consult Note (Signed)
NEURO HOSPITALIST CONSULT NOTE    Reason for Consult: new onset seizures, confusion, unsteady gait, and weakness.  HPI:                                                                                                                                          Ross Bender is an 79 y.o. male, right handed, with a past medical history of hypertension, hyperlipidemia, diabetes mellitus, type II, coronary artery disease, carotid artery disease s/p left CEA, back pain, status post back surgery, transferred to [facility] due to new onset GTC seizure, confusion, unsteady gait, and weakness. Mr. Bruington is obviously confused and can not provide reliable information and thus all clinical data is obtained from her family. Today, he was in the passenger seat in his way to see his neurosurgeon when suddenly his daughter noticed that he was having violent generalized jerking movements, unresponsiveness that lasted for several seconds and afterwards he was confused and disoriented, amnestic for the event. He was taken to [facility] ED where he was noted to be confused, became combative, then stared straight ahead not responding to anybody.  Received 1 mg IV ativan in the ED and hasn't had further seizures.  CT brain showed no acute abnormality and serologies were unremarkable. No recent fever, infection, use of narcotics, benzodiazepines, or illicit drugs. Importantly, family reports that he has been progressively confused, forgetful, unsteady on his feet, depressed, and had had a couple of falls. Had a single incident of bladder incontinence recently. At this time he is confused but able to anwer questions and follow commands.  Denies HA, vertigo, double vision, difficulty swallowing, slurred speech, language or vision impairment.  Past Medical History  Diagnosis Date  . Diabetes mellitus   . Hypertension   . Hyperlipidemia   . CAD (coronary artery disease)   . Leg pain     with  walking  . Carotid artery occlusion     Past Surgical History  Procedure Laterality Date  . Back surgery  07/10/11  . Carotid endarterectomy  01/10/11    Left Carotid  . Coronary stent placement  08/09/2005    Right  . Spine surgery  07/10/11    Family History  Problem Relation Age of Onset  . Hypertension Mother   . Deep vein thrombosis Mother   . Diabetes Mother   . Heart disease Mother   . COPD Father   . Heart disease Sister   . Heart attack Sister     Bypass  . Heart disease Brother     Heart Disease before age 84  . Heart disease Brother   . Hypertension Son     Family History: no epilepsy.   Social History:  reports that he quit smoking about 32 years ago. His  smoking use included Cigarettes. He smoked 0.00 packs per day. He does not have any smokeless tobacco history on file. He reports that he does not drink alcohol or use illicit drugs.  Allergies  Allergen Reactions  . Omnipaque [Iohexol]   . Codeine Nausea And Vomiting    MEDICATIONS:                                                                                                                     I have reviewed the patient's current medications.   ROS:                                                                                                                                       History obtained from chart review and family.  General ROS: negative for - chills, fatigue, fever, night sweats, weight gain or weight loss Psychological ROS: negative for - behavioral disorder, hallucinations, or suicidal ideation Ophthalmic ROS: negative for - blurry vision, double vision, eye pain or loss of vision ENT ROS: negative for - epistaxis, nasal discharge, oral lesions, sore throat, tinnitus or vertigo Allergy and Immunology ROS: negative for - hives or itchy/watery eyes Hematological and Lymphatic ROS: negative for - bleeding problems, bruising or swollen lymph nodes Endocrine ROS: negative for -  galactorrhea, hair pattern changes, polydipsia/polyuria or temperature intolerance Respiratory ROS: negative for - cough, hemoptysis, shortness of breath or wheezing Cardiovascular ROS: negative for - chest pain, dyspnea on exertion, edema or irregular heartbeat Gastrointestinal ROS: negative for - abdominal pain, diarrhea, hematemesis, nausea/vomiting or stool incontinence Genito-Urinary ROS: negative for - dysuria, hematuria Musculoskeletal ROS: negative for - joint swelling or muscular weakness Neurological ROS: as noted in HPI Dermatological ROS: negative for rash and skin lesion changes   Physical exam: pleasant male in no apparent distress, pleasantly confused. Blood pressure 157/74, pulse 98, temperature 98.4 F (36.9 C), temperature source Oral, resp. rate 18, SpO2 98.00%. Head: normocephalic. Neck: supple, no bruits, no JVD. Cardiac: no murmurs. Lungs: clear. Abdomen: soft, no tender, no mass. Extremities: no edema.  Neurologic Examination:  Mental Status: Alert, disoriented to place-month-day-situation. Comprehension, naming intact. Speech fluent without evidence of aphasia.  Able to follow simple step commands without difficulty. Cranial Nerves: II: Discs flat bilaterally; Visual fields grossly normal, pupils equal, round, reactive to light and accommodation III,IV, VI: ptosis not present, extra-ocular motions intact bilaterally V,VII: smile symmetric, facial light touch sensation normal bilaterally VIII: hearing normal bilaterally IX,X: gag reflex present XI: bilateral shoulder shrug XII: midline tongue extension without atrophy or fasciculations  Motor: Right : Upper extremity   5/5    Left:     Upper extremity   5/5  Lower extremity   5/5     Lower extremity   5/5 Tone and bulk:normal tone throughout; no atrophy noted Sensory: Pinprick and light touch intact throughout,  bilaterally Deep Tendon Reflexes:  Right: Upper Extremity   Left: Upper extremity   biceps (C-5 to C-6) 2/4   biceps (C-5 to C-6) 2/4 tricep (C7) 2/4    triceps (C7) 2/4 Brachioradialis (C6) 2/4  Brachioradialis (C6) 2/4  Lower Extremity Lower Extremity  quadriceps (L-2 to L-4) 2/4   quadriceps (L-2 to L-4) 2/4 Achilles (S1) 2/4   Achilles (S1) 2/4  Plantars: Right: downgoing   Left: downgoing Cerebellar: normal finger-to-nose,  normal heel-to-shin test Gait:  No tested. CV: pulses palpable throughout    No results found for this basename: cbc, bmp, coags, chol, tri, ldl, hga1c    No results found for this or any previous visit (from the past 48 hour(s)).  Dg Chest Port 1 View  05/05/2013   CLINICAL DATA:  Seizure.  EXAM: PORTABLE CHEST - 1 VIEW  COMPARISON:  Plain film of the chest 05/05/2013 at 11:55 a.m. and PA and lateral chest 01/03/2011.  FINDINGS: The lungs are clear. Heart size is normal. No pneumothorax or pleural effusion.  IMPRESSION: No acute disease.   Electronically Signed   By: Drusilla Kanner M.D.   On: 05/05/2013 20:13   Assessment/Plan: 79 y/o with new onset isolated GTC seizure earlier today. He is confused and disoriented to place-month-day-situation but has a non focal exam. Recommend: 1) MRI brain with and without contrast to exclude a structural cause of new onset seizure. 2) EEG. 3) Will defer anticonvulsant treatment pending results of neuro-testing. Of importance, family reports confusion, forgetfulness, gait impairment, falls over the past few months and thus can not exclude new onset GTC seizure in the setting of a primary cognitive disorder which will warrant starting anti-seizure treatment. Will follow up.   Wyatt Portela, MD 05/05/2013, 8:48 PM

## 2013-05-05 NOTE — H&P (Signed)
[facility] History and Physical  [name] ZOX:[id number] DOB: 12/02/1934 DOA: 05/05/2013   PCP: Donzetta SprungANIEL, TERRY, MD  Specialists: He sees Dr. Channing Muttersoy, with neurosurgery for his back problem. He has been seen by [facility] cardiology in the past for his coronary artery disease. He's also been followed by Dr. Edilia Boickson with vascular for his history of carotid endarterectomy.  Chief Complaint: Confusion, weakness, unsteady gait, and seizure activity  HPI: [name] BumpersCleveland [name] is a 79 y.o. male has a past medical history of hypertension, diabetes mellitus, type II, coronary artery disease, carotid artery disease, back pain, status post back surgery, who is unable to provide much history due to his confusion. Most of the history was provided by his wife and 2 daughters and, hence history is limited. It appears that for the past few weeks, but it could be longer, patient has been having staggering gait/unsteady gait. He's had a few falls. And then since Friday it appears the patient had not been feeling well. He has been confused. He had difficulty doing simple activities such as wearing his pants, which he was able to do before. He's had a few episodes of incontinence of urine. His wife had struggled to get him to walk. There's been no fever. No chills. No sick contacts. No nausea, vomiting. He did have a headache earlier today but none currently. Apparently he's had some hallucinations as well, which has been visual. The only medication change that was made recently was change from atenolol to Norvasc last week. Otherwise, the family denies any changes to his other medications. No changes to his Clonazepam dosing. He's not on narcotics on a chronic basis and has not stopped taking any medications. Currently no history of any alcohol use. Thery spoke to the patient's primary care physician, and they were taking the patient to see his neurosurgeon when he had a grand mal seizure activity enroute. Came to the  emergency [facility]. And, when he was there he had an episode when he got combative and then stared straight ahead not responding to anybody. At that time there was no grand mal activity. Family also mentioned that patient has been very tremulous and these tremors have increased over the last 6 months. Decision was made for the patient to be [facility].   Home Medications: Prior to Admission medications   Medication Sig Start Date End Date Taking? Authorizing Provider  amLODipine (NORVASC) 10 MG tablet Take 10 mg by mouth daily.   Yes Historical Provider, MD  aspirin 81 MG tablet Take 81 mg by mouth daily.     Yes Historical Provider, MD  clonazePAM (KLONOPIN) 1 MG tablet Take 1 mg by mouth 2 (two) times daily.   Yes Historical Provider, MD  gabapentin (NEURONTIN) 300 MG capsule Take 300 mg by mouth 2 (two) times daily.  07/31/11  Yes Historical Provider, MD  GLIPIZIDE XL 5 MG 24 hr tablet Take 5 mg by mouth Daily. 08/11/11  Yes Historical Provider, MD  lisinopril-hydrochlorothiazide (PRINZIDE,ZESTORETIC) 20-12.5 MG per tablet Take 1 tablet by mouth daily.     Yes Historical Provider, MD  metFORMIN (GLUMETZA) 500 MG (MOD) 24 hr tablet Take 1,000 mg by mouth 2 (two) times daily with a meal.    Yes Historical Provider, MD  omeprazole (PRILOSEC) 20 MG capsule Take 20 mg by mouth Daily. 08/11/11  Yes Historical Provider, MD  simvastatin (ZOCOR) 80 MG tablet Take 80 mg by mouth at bedtime.     Yes Historical  Provider, MD  traZODone (DESYREL) 50 MG tablet Take 50 mg by mouth at bedtime.  08/11/11  Yes Historical Provider, MD    Allergies:  Allergies  Allergen Reactions  . Omnipaque [Iohexol]   . Codeine Nausea And Vomiting    Past Medical History: Past Medical History  Diagnosis Date  . Diabetes mellitus   . Hypertension   . Hyperlipidemia   . CAD (coronary artery disease)   . Leg pain     with walking  . Carotid artery occlusion     Past  Surgical History  Procedure Laterality Date  . Back surgery  07/10/11  . Carotid endarterectomy  01/10/11    Left Carotid  . Coronary stent placement  08/09/2005    Right  . Spine surgery  07/10/11    Social History: Patient lives with his wife in [place]. Quit Smoking about 25 years ago. Quit Drinking alcohol about 20 years ago. No history of illicit drug use. Usually has been independent with daily activities but for the past 6 months he has been declining progressively.  Family History:  Family History  Problem Relation Age of Onset  . Hypertension Mother   . Deep vein thrombosis Mother   . Diabetes Mother   . Heart disease Mother   . COPD Father   . Heart disease Sister   . Heart attack Sister     Bypass  . Heart disease Brother     Heart Disease before age 101  . Heart disease Brother   . Hypertension Son      Review of Systems - unable to obtain from the patient due to his mental status  Physical Examination  Filed Vitals:   05/05/13 1826  BP: 151/79  Pulse: 107  Temp: 97.9 F (36.6 C)  TempSrc: Oral  Resp: 18  SpO2: 96%    General appearance: alert, cooperative, appears stated age and no distress Head: Normocephalic, without obvious abnormality, atraumatic Eyes: conjunctivae/corneas clear. PERRL, EOM's intact.  Throat: lips, mucosa, and tongue normal; teeth and gums normal Neck: no adenopathy, no carotid bruit, no JVD, supple, symmetrical, trachea midline and thyroid not enlarged, symmetric, no tenderness/mass/nodules Back: symmetric, no curvature. ROM normal. No CVA tenderness. Resp: clear to auscultation bilaterally Cardio: regular rate and rhythm, S1, S2 normal, no murmur, click, rub or gallop GI: soft, non-tender; bowel sounds normal; no masses,  no organomegaly Extremities: extremities normal, atraumatic, no cyanosis or edema Pulses: poorly palpable Skin: Skin color, texture, turgor normal. No rashes or lesions Lymph nodes: Cervical, supraclavicular, and  axillary nodes normal. Neurologic: Patient is very tremulous. He is alert. He recognized his wife. Did not know the president's name. He knew he wasn't [facility]. He knew he was in [place]. Could not tell me the state. Could not tell me the date, month or year. Cranial nerves 2-12 are intact. Motor strength is equal, bilateral upper and lower extremity. No pronator drift. Finger to nose was very slow and patient did not follow my commands despite multiple repetitions. Dysdiadochokinesis was also not accurate as the patient could not follow my commands properly. Gait was not assessed. Reflexes are equal. No obvious sensory deficits.  Laboratory Data: All of the following information is from the records sent from [facility].  WBC was 7.5. Hemoglobin 14.1. Platelet count 122. MCV is 84. Sodium 137, potassium 4.0, chloride 102, bicarbonate 22, magnesium 1.4, total bilirubin 1.3, AST, ALT were normal. Alkaline phosphatase normal. calcium normal. creatinine 1.48, glucose, 212, BUN 16, PT/INR  normal.  CT head Impression: Stable atrophy with mild periventricular small vessel disease. There is paranasal sinus disease. There is no intracranial mass, hemorrhage or acute hearing infarct.  BNP 16.  ABG showed a pH of 7.44, with a PCO2 of 33. PO2 was 85.  Chest x-ray showed right base atelectasis or infiltrate.  Lactate level was 1.4.  Electrocardiogram: EKG shows a sinus tachycardia. 120 beats per minute. Possibly normal axis. Possible Q waves in inferior leads. No acute ST or T-wave changes are noted otherwise. Intervals are normal.  Problem List  Principal Problem:   Acute encephalopathy Active Problems:   HYPERTENSION, UNSPECIFIED   CAD, NATIVE VESSEL   Seizure   DM type 2 (diabetes mellitus, type 2)   Assessment: This is a 79 year old, Caucasian male who presents predominantly with altered mental status and seizure type activity earlier today. But it appears, that his  encephalopathy might have been ongoing for the last many days. And this has been progressively getting worse. Etiology remains unclear. Based on history doesn't appear to be medication induced. CVA will need to be ruled out. Although he doesn't have any focal neurological deficits per se. Infectious etiology seems unlikely as he is afebrile and was found to have a normal white blood cell count. Toxic and metabolic reasons should be considered.  Plan: #1 acute encephalopathy of unclear etiology: He will need to undergo MRI of brain. We'll check B12, RPR, TSH. Urine drug screen. His magnesium was low at [facility], but it is unclear if he received any magnesium over there. We will repeat the test here tonight. Recheck his LFTs. Consult neurology to assist with management. Examination was nonfocal. We'll hold all his home medications that can cause confusion.  #2 seizure type activity: He has no known history of seizures. He had one grand mal episode earlier today and another blank stare, but it remains in unclear if that was a seizure activity or not. In any case we will get an EEG and consult neurology. MRI as mentioned above. Magnesium level to be checked. Hold off on antiepileptic medications till seen by neurology.  #3 diabetes mellitus type 2: Sliding scale insulin coverage will be initiated. HbA1c will be checked. Hold his oral agents for now.  #4 history of hypertension: Monitor blood pressure closely. Treat with when necessary agents for now.  #5 coronary artery disease, and carotid artery disease: He has a stent in his RCA placed in 2007. His carotid endarterectomy was performed in 2012. Continue with aspirin for now. Continue with statin medication. Monitor closely.  All blood work has been ordered in this hospital for tonight. This is all pending. Blood from [facility] was reviewed as above.   DVT Prophylaxis: Heparin Code Status: Full code Family Communication: Discussed with the patient's  wife and daughters  Disposition Plan: Admit to telemetry   Further management decisions will depend on results of further testing and patient's response to treatment.  [facility]  [facility] Pager [phone]  If 7PM-7AM, please contact night-coverage [email or web address] Password [facility]  05/05/2013, 7:19 PM

## 2013-05-06 ENCOUNTER — Inpatient Hospital Stay (HOSPITAL_COMMUNITY): Payer: Medicare Other

## 2013-05-06 DIAGNOSIS — I519 Heart disease, unspecified: Secondary | ICD-10-CM

## 2013-05-06 LAB — COMPREHENSIVE METABOLIC PANEL
ALT: 22 U/L (ref 0–53)
AST: 31 U/L (ref 0–37)
Albumin: 4 g/dL (ref 3.5–5.2)
Alkaline Phosphatase: 59 U/L (ref 39–117)
BUN: 13 mg/dL (ref 6–23)
CO2: 23 mEq/L (ref 19–32)
Calcium: 9.2 mg/dL (ref 8.4–10.5)
Chloride: 103 mEq/L (ref 96–112)
Creatinine, Ser: 1.18 mg/dL (ref 0.50–1.35)
GFR calc Af Amer: 67 mL/min — ABNORMAL LOW (ref >90)
GFR calc non Af Amer: 58 mL/min — ABNORMAL LOW (ref >90)
Glucose, Bld: 155 mg/dL — ABNORMAL HIGH (ref 70–99)
POTASSIUM: 4.1 meq/L (ref 3.7–5.3)
SODIUM: 141 meq/L (ref 137–147)
TOTAL PROTEIN: 7.4 g/dL (ref 6.0–8.3)
Total Bilirubin: 0.7 mg/dL (ref 0.3–1.2)

## 2013-05-06 LAB — RAPID URINE DRUG SCREEN, HOSP PERFORMED
AMPHETAMINES: NOT DETECTED
Barbiturates: NOT DETECTED
Benzodiazepines: NOT DETECTED
Cocaine: NOT DETECTED
OPIATES: NOT DETECTED
Tetrahydrocannabinol: NOT DETECTED

## 2013-05-06 LAB — GLUCOSE, CAPILLARY
GLUCOSE-CAPILLARY: 139 mg/dL — AB (ref 70–99)
Glucose-Capillary: 146 mg/dL — ABNORMAL HIGH (ref 70–99)
Glucose-Capillary: 139 mg/dL — ABNORMAL HIGH (ref 70–99)
Glucose-Capillary: 122 mg/dL — ABNORMAL HIGH (ref 70–99)
Glucose-Capillary: 144 mg/dL — ABNORMAL HIGH (ref 70–99)

## 2013-05-06 LAB — VITAMIN B12: Vitamin B-12: 467 pg/mL (ref 211–911)

## 2013-05-06 LAB — CBC
HEMATOCRIT: 38.5 % — AB (ref 39.0–52.0)
Hemoglobin: 13.3 g/dL (ref 13.0–17.0)
MCH: 28.8 pg (ref 26.0–34.0)
MCHC: 34.5 g/dL (ref 30.0–36.0)
MCV: 83.3 fL (ref 78.0–100.0)
Platelets: 123 10*3/uL — ABNORMAL LOW (ref 150–400)
RBC: 4.62 MIL/uL (ref 4.22–5.81)
RDW: 13.6 % (ref 11.5–15.5)
WBC: 6.9 10*3/uL (ref 4.0–10.5)

## 2013-05-06 LAB — RPR: RPR Ser Ql: NONREACTIVE

## 2013-05-06 LAB — HEMOGLOBIN A1C
Hgb A1c MFr Bld: 7.8 % — ABNORMAL HIGH (ref <5.7)
Mean Plasma Glucose: 177 mg/dL — ABNORMAL HIGH (ref <117)

## 2013-05-06 LAB — TSH: TSH: 1.436 u[IU]/mL (ref 0.350–4.500)

## 2013-05-06 LAB — AMMONIA: Ammonia: 13 umol/L (ref 11–60)

## 2013-05-06 LAB — PHOSPHORUS: Phosphorus: 3.1 mg/dL (ref 2.3–4.6)

## 2013-05-06 LAB — MAGNESIUM: Magnesium: 1.6 mg/dL (ref 1.5–2.5)

## 2013-05-06 MED ORDER — LISINOPRIL 20 MG PO TABS
20.0000 mg | ORAL_TABLET | Freq: Every day | ORAL | Status: DC
  Administered 2013-05-06 – 2013-05-09 (×4): 20 mg via ORAL
  Filled 2013-05-06 (×5): qty 1

## 2013-05-06 MED ORDER — LORAZEPAM 2 MG/ML IJ SOLN
0.5000 mg | Freq: Once | INTRAMUSCULAR | Status: AC
  Administered 2013-05-06: 0.5 mg via INTRAVENOUS
  Filled 2013-05-06: qty 1

## 2013-05-06 MED ORDER — HYDROCHLOROTHIAZIDE 12.5 MG PO CAPS
12.5000 mg | ORAL_CAPSULE | Freq: Every day | ORAL | Status: DC
  Administered 2013-05-06 – 2013-05-09 (×4): 12.5 mg via ORAL
  Filled 2013-05-06 (×4): qty 1

## 2013-05-06 MED ORDER — HALOPERIDOL LACTATE 5 MG/ML IJ SOLN
5.0000 mg | Freq: Four times a day (QID) | INTRAMUSCULAR | Status: DC | PRN
  Administered 2013-05-07 – 2013-05-11 (×4): 5 mg via INTRAVENOUS
  Filled 2013-05-06 (×4): qty 1

## 2013-05-06 MED ORDER — DIPHENHYDRAMINE HCL 50 MG/ML IJ SOLN
INTRAMUSCULAR | Status: AC
  Filled 2013-05-06: qty 1

## 2013-05-06 MED ORDER — LISINOPRIL-HYDROCHLOROTHIAZIDE 20-12.5 MG PO TABS: 1.0000 | ORAL_TABLET | Freq: Every day | ORAL | Status: DC

## 2013-05-06 MED ORDER — DIPHENHYDRAMINE HCL 50 MG/ML IJ SOLN
25.0000 mg | Freq: Once | INTRAMUSCULAR | Status: AC
  Administered 2013-05-06: 25 mg via INTRAVENOUS

## 2013-05-06 MED ORDER — ATORVASTATIN CALCIUM 40 MG PO TABS
40.0000 mg | ORAL_TABLET | Freq: Every day | ORAL | Status: DC
  Administered 2013-05-06 – 2013-05-14 (×9): 40 mg via ORAL
  Filled 2013-05-06 (×9): qty 1

## 2013-05-06 MED ORDER — HALOPERIDOL LACTATE 5 MG/ML IJ SOLN
2.0000 mg | Freq: Four times a day (QID) | INTRAMUSCULAR | Status: DC | PRN
  Administered 2013-05-06 (×2): 2 mg via INTRAVENOUS
  Administered 2013-05-06: 5 mg via INTRAVENOUS
  Administered 2013-05-06: 2 mg via INTRAVENOUS
  Filled 2013-05-06 (×4): qty 1

## 2013-05-06 NOTE — Procedures (Addendum)
ELECTROENCEPHALOGRAM REPORT   Patient: [name]       Room #: 1O104N03 EEG No. ID: [id number] Age: 79 y.o.        Sex: male Referring Physician: Janee Mornhompson Report Date:  05/06/2013        Interpreting Physician: Thana FarrEYNOLDS,Laster Appling D  History: [name] is an 79 y.o. male with new onset GTC seizure  Medications:  Scheduled: . aspirin EC  325 mg Oral Daily  . heparin  5,000 Units Subcutaneous Q8H  . insulin aspart  0-15 Units Subcutaneous TID WC  . insulin aspart  0-5 Units Subcutaneous QHS  . multivitamin with minerals  1 tablet Oral Daily  . sodium chloride  3 mL Intravenous Q12H  . thiamine  100 mg Oral Daily    Conditions of Recording:  This is a 16 channel EEG carried out with the patient in the awake, drowsy and asleep states.  Description:  The waking background activity consists of a low voltage, symmetrical, fairly well organized, 8 Hz alpha activity, seen from the parieto-occipital and posterior temporal regions.  Low voltage fast activity, poorly organized, is seen anteriorly and is at times superimposed on more posterior regions.  A mixture of theta and alpha rhythms are seen from the central and temporal regions. The patient drowses with slowing to irregular, low voltage theta and beta activity.   The patient goes in to a light sleep with symmetrical sleep spindles, vertex central sharp transients and irregular slow activity.   There was one sharp transient noted during the tracing with phase reversal at C4.   Hyperventilation was not performed.   Intermittent photic stimulation was performed but failed to illicit any change in the tracing.   IMPRESSION: This is a normal electroencephalogram  COMMENT: The one sharp transient is noted during this tracing.  This is within the range of what can be considered as normal.  Clinical correlation is recommended.   Thana FarrLeslie Jeremy Ditullio, MD [facility] [phone] 05/06/2013, 1:43 PM

## 2013-05-06 NOTE — Progress Notes (Addendum)
Subjective: Patient with no further seizures overnight.  Remains somewhat confused.  Family feels that he has developed a new facial droop.  Objective: Current vital signs: BP 137/81  Pulse 107  Temp(Src) 98.1 F (36.7 C) (Oral)  Resp 20  Ht _0  (1.702 m)  Wt 80.2 kg (176 lb 12.9 oz)  BMI 27.69 kg/m2  SpO2 96% Vital signs in last 24 hours: Temp:  [97.9 F (36.6 C)-98.8 F (37.1 C)] 98.1 F (36.7 C) (01/20 1003) Pulse Rate:  [96-107] 107 (01/20 1003) Resp:  [18-20] 20 (01/20 1003) BP: (123-157)/(66-81) 137/81 mmHg (01/20 1003) SpO2:  [92 %-98 %] 96 % (01/20 1003) Weight:  [80.2 kg (176 lb 12.9 oz)] 80.2 kg (176 lb 12.9 oz) (01/19 2300)  Intake/Output from previous day:   Intake/Output this shift: Total I/O In: 240 [P.O.:240] Out: -  Nutritional status: Carb Control  Neurologic Exam: Mental Status:  Alert.  Does not know why he is in the hospital.  Laughs away most responses.  Speech fluent without evidence of aphasia. Able to follow simple step commands but has difficulty with three step commands even with reinforcement  Cranial Nerves:  II: Discs flat bilaterally; Visual fields grossly normal, pupils equal, round, reactive to light and accommodation  III,IV, VI: ptosis not present, extra-ocular motions intact bilaterally  V,VII: decrease in the right NLF, facial light touch sensation normal bilaterally  VIII: hearing normal bilaterally  IX,X: gag reflex present  XI: bilateral shoulder shrug  XII: midline tongue extension without atrophy or fasciculations  Motor:  5/5 throughout Sensory: Pinprick and light touch intact throughout, bilaterally  Deep Tendon Reflexes:  2+ throughout Plantars:  Right: downgoing   Left: downgoing  Cerebellar:  Unable to perform due to mental status   Lab Results: Basic Metabolic Panel:  Recent Labs Lab 05/06/13 0557  NA 141  K 4.1  CL 103  CO2 23  GLUCOSE 155*  BUN 13  CREATININE 1.18  CALCIUM 9.2  MG 1.6  PHOS 3.1     Liver Function Tests:  Recent Labs Lab 05/06/13 0557  AST 31  ALT 22  ALKPHOS 59  BILITOT 0.7  PROT 7.4  ALBUMIN 4.0   No results found for this basename: LIPASE, AMYLASE,  in the last 168 hours  Recent Labs Lab 05/06/13 0557  AMMONIA 13    CBC:  Recent Labs Lab 05/05/13 2058 05/06/13 0557  WBC 8.1 6.9  NEUTROABS 5.3  --   HGB 14.1 13.3  HCT 40.8 38.5*  MCV 84.1 83.3  PLT 130* 123*    Cardiac Enzymes: No results found for this basename: CKTOTAL, CKMB, CKMBINDEX, TROPONINI,  in the last 168 hours  Lipid Panel: No results found for this basename: CHOL, TRIG, HDL, CHOLHDL, VLDL, LDLCALC,  in the last 168 hours  CBG:  Recent Labs Lab 05/05/13 2215 05/06/13 0634  GLUCAP 146* 139*    Microbiology: Results for orders placed during the hospital encounter of 01/03/11  SURGICAL PCR SCREEN     Status: Abnormal   Collection Time    01/03/11 10:00 AM      Result Value Range Status   MRSA, PCR NEGATIVE  NEGATIVE Final   Staphylococcus aureus POSITIVE (*) NEGATIVE Final   Comment:            The Xpert SA Assay (FDA     approved for NASAL specimens     only), is one component of     a comprehensive surveillance     program.  It is not intended     to diagnose infection nor to     guide or monitor treatment.    Coagulation Studies:  Recent Labs  05/05/13 2058  LABPROT 12.6  INR 0.96    Imaging: Dg Chest Port 1 View  05/05/2013   CLINICAL DATA:  Seizure.  EXAM: PORTABLE CHEST - 1 VIEW  COMPARISON:  Plain film of the chest 05/05/2013 at 11:55 a.m. and PA and lateral chest 01/03/2011.  FINDINGS: The lungs are clear. Heart size is normal. No pneumothorax or pleural effusion.  IMPRESSION: No acute disease.   Electronically Signed   By: Inge Rise M.D.   On: 05/05/2013 20:13    Medications:  I have reviewed the patient's current medications. Scheduled: . aspirin EC  325 mg Oral Daily  . heparin  5,000 Units Subcutaneous Q8H  . insulin aspart   0-15 Units Subcutaneous TID WC  . insulin aspart  0-5 Units Subcutaneous QHS  . multivitamin with minerals  1 tablet Oral Daily  . sodium chloride  3 mL Intravenous Q12H  . thiamine  100 mg Oral Daily    Assessment/Plan: Patient presenting with new onset seizures.  Confused as well.  Family reports that at baseline patient is cognitively intact.  B12, ESR, RPR normal.  Work up pending.    Recommendations: 1.  MRI and EEG pending.  Will follow up results.  2.  Continue seizure precautions  Case discussed with Dr. Grandville Silos    LOS: 1 day   Alexis Goodell, MD [facility] [phone] 05/06/2013  11:42 AM

## 2013-05-06 NOTE — Progress Notes (Signed)
RN was called to Pt.'s room, after student RN and instructor informed me that family indicated that Pt. Had developed right sided facial droop.  MD was notified.  RN and doctor when it to room to assess patient.  Pt. Continued to be confused and facial droop was not present.  Family indicated that they believe he was more confused then earlier.  Will continue to monitor patient. No orders where given at this time.  Call bell placed within reach.

## 2013-05-06 NOTE — Progress Notes (Signed)
Pt given a total of 11mg  of Haldol per MD orders.  He received 2mg  doses x3times prior to MRI and test was not completed b/c patient continued to be agitated.  5 mg of Haldol given at 1815 for continuation of agitation. Pt. Continues to be agitated and combative.  Will continue to monitor patient.

## 2013-05-06 NOTE — Evaluation (Signed)
Physical Therapy Evaluation Patient Details Name: [name] BumpersCleveland [name] MRN: [id number] DOB: 10/09/1934 Today's Date: 05/06/2013 Time: [id number] PT Time Calculation (min): 28 min  PT Assessment / Plan / Recommendation History of Present Illness   79 y.o. male admitted with confusion, weakness, unsteady gait and seizure activity. Pt with hx of HTN, DM2, CAD, back pain, incontinence, visual hallunications and s/p back surg. Pt with recent falls. family  taking the patient to see his neurosurgeon when he had a grand mal seizure activity enroute. [facility]. And, when he was there he had an episode when he got combative and then stared straight ahead not responding to anybody. At that time there was no grand mal activity. Family also mentioned that patient has been very tremulous and these tremors have increased over the last 6 months. Decision was made for the patient to be [facility].   Clinical Impression  Patient demonstrates deficits in functional mobility as indicated below. Patient will benefit from skilled Pt to address deficits and maximize function. Will continue to see as indicated and progress as tolerated. Recommend CIR consult for post acute rehab.    PT Assessment  Patient needs continued PT services    Follow Up Recommendations  CIR    Does the patient have the potential to tolerate intense rehabilitation      Barriers to Discharge        Equipment Recommendations  Other (comment) (TBD)    Recommendations for Other Services Rehab consult   Frequency Min 3X/week    Precautions / Restrictions Precautions Precautions: Fall Precaution Comments: monitor BP and HR Restrictions Weight Bearing Restrictions: No   Pertinent Vitals/Pain No pain, vitals documented per flow chart (see Vitals tab) +orthostatics      Mobility  Bed Mobility Overal bed mobility: Needs Assistance Bed Mobility: Supine to Sit Supine to  sit: Max assist;HOB elevated General bed mobility comments: pt required (A) to sequence task and physcial (A) due to posterior lean Transfers Overall transfer level: Needs assistance Equipment used: 2 person hand held assist Transfers: Sit to/from Stand Sit to Stand: Mod assist General transfer comment: pt with LOB in all directions but greatest being posteriorly Ambulation/Gait Ambulation/Gait assistance: Mod assist Ambulation Distance (Feet): 16 Feet Assistive device: 2 person hand held assist Gait Pattern/deviations: Step-to pattern;Shuffle;Ataxic;Staggering right;Drifts right/left Gait velocity: decreased Gait velocity interpretation: <1.8 ft/sec, indicative of risk for recurrent falls General Gait Details: very unsteady, increased HR with standing and ambulating. Nsg aware.    Exercises     PT Diagnosis: Difficulty walking;Abnormality of gait;Altered mental status  PT Problem List: Decreased activity tolerance;Decreased balance;Decreased mobility;Decreased coordination;Decreased cognition PT Treatment Interventions: DME instruction;Gait training;Stair training;Functional mobility training;Therapeutic activities;Therapeutic exercise;Balance training;Patient/family education     PT Goals(Current goals can be found in the care plan section) Acute Rehab PT Goals Patient Stated Goal: pt unable to participate PT Goal Formulation: With patient Time For Goal Achievement: 05/20/13 Potential to Achieve Goals: Good  Visit Information  Last PT Received On: 05/06/13 Assistance Needed: +1 PT/OT/SLP Co-Evaluation/Treatment: Yes Reason for Co-Treatment: For patient/therapist safety PT goals addressed during session: Mobility/safety with mobility;Balance OT goals addressed during session: ADL's and self-care History of Present Illness:  79 y.o. male admitted with confusion, weakness, unsteady gait and seizure activity. Pt with hx of HTN, DM2, CAD, back pain, incontinence, visual  hallunications and s/p back surg. Pt with recent falls. family  taking the patient to see his neurosurgeon when he had a grand  mal seizure activity enroute. Came to the emergency department at [facility] And, when he was there he had an episode when he got combative and then stared straight ahead not responding to anybody. At that time there was no grand mal activity. Family also mentioned that patient has been very tremulous and these tremors have increased over the last 6 months. Decision was made for the patient to be transferred to [facility].        Prior Functioning  Home Living Family/patient expects to be discharged to:: Private residence Living Arrangements: Spouse/significant other Type of Home: House Home Access: Stairs to enter [employer] of Steps: 2 Entrance Stairs-Rails: None Home Layout: One level Home Equipment: Environmental consultant - 2 wheels;Cane - single point Prior Function Level of Independence: Independent Communication Communication: HOH Dominant Hand: Right    Cognition  Cognition Arousal/Alertness: Awake/alert Behavior During Therapy: Flat affect;Impulsive Overall Cognitive Status: Impaired/Different from baseline Area of Impairment: Orientation;Attention;Memory;Safety/judgement;Awareness;Problem solving;Following commands Orientation Level: Disoriented to;Place;Time;Situation Current Attention Level: Sustained Memory: Decreased recall of precautions;Decreased short-term memory Following Commands: Follows one step commands inconsistently;Follows one step commands with increased time Safety/Judgement: Decreased awareness of safety;Decreased awareness of deficits Awareness: Emergent;Anticipatory Problem Solving: Slow processing;Difficulty sequencing General Comments: Pt demonstrates deficits following 2 step commands, reports personal name and wife's name. unable to verbalize family names or relationship (x2 daughters and x2 granddaughters in  addition to wife), states "i dont know" when asked DOB    Extremity/Trunk Assessment Upper Extremity Assessment Upper Extremity Assessment: Overall WFL for tasks assessed Lower Extremity Assessment Lower Extremity Assessment: Overall WFL for tasks assessed   Balance Balance Overall balance assessment: Needs assistance Postural control: Posterior lean Standing balance support: During functional activity  End of Session PT - End of Session Equipment Utilized During Treatment: Gait belt Activity Tolerance: Treatment limited secondary to medical complications (Comment) (elevated HR, + orthostatic BP) Patient left: in bed;with call bell/phone within reach;with bed alarm set;with nursing/sitter in room;with family/visitor present Nurse Communication: Mobility status  GP     Fabio Asa 05/06/2013, 3:28 PM Charlotte Crumb, PT DPT  [phone]

## 2013-05-06 NOTE — Progress Notes (Signed)
  Echocardiogram 2D Echocardiogram has been performed.  Taji Barretto FRANCES 05/06/2013, 12:04 PM

## 2013-05-06 NOTE — Progress Notes (Signed)
EEG completed; results pending.    

## 2013-05-06 NOTE — Progress Notes (Signed)
Rehab Admissions Coordinator Note:  Patient was screened by Hadia Minier L for appropriateness for an Inpatient Acute Rehab Consult.  At this time, we are recommending Inpatient Rehab consult.  Jeffey Janssen, PT Rehabilitation Admissions Coordinator [phone]  

## 2013-05-06 NOTE — Progress Notes (Signed)
[facility] PROGRESS NOTE  [facility] AVW:[id number] DOB: Jan 13, 1935 DOA: 05/05/2013 PCP: Donzetta Sprung, MD  Assessment/Plan: #1 new onset seizures Personable etiology. Patient currently confused. Per family patient is cognitively intact at baseline. EEG is pending. MRI is pending. Continue seizure precautions. B12, sedimentation rate, RPR normal. TSH is normal. Neurology is following and appreciate input and recommendations.  #2 acute encephalopathy Likely secondary to problem #1. B12, RPR, TSH within normal limits. Neurology following and appreciate input and recommendations.  #3 type 2 diabetes Sliding scale insulin.  #4 hypertension Follow for now. Resume home regimen of Prinzide.  #5 CAD/Carotid artery disease Status post stent to the RCA in 2007. Status post carotid endarterectomy in 2012. Continue aspirin and statin.  Code Status: Full Family Communication: updated patient and family. Disposition Plan: Pending workup.   Consultants:  Neurology: Dr Cyril Mourning 05/05/13  Procedures:    Antibiotics:  None  HPI/Subjective: Patient with no complaints.. Patient somewhat confused. Family feel patient may have left facial droop.  Objective: Filed Vitals:   05/06/13 1003  BP: 137/81  Pulse: 107  Temp: 98.1 F (36.7 C)  Resp: 20    Intake/Output Summary (Last 24 hours) at 05/06/13 1053 Last data filed at 05/06/13 0900  Gross per 24 hour  Intake    240 ml  Output      0 ml  Net    240 ml   Filed Weights   05/05/13 2300  Weight: 80.2 kg (176 lb 12.9 oz)    Exam:   General:  NAD  Cardiovascular: RRR  Respiratory: CTAB  Abdomen: SOFT/nt/nd/+bs  Musculoskeletal: No c/c/e  Neuro: Patient with some confusion, smiling,no focal neurological deficits.  Data Reviewed: Basic Metabolic Panel:  Recent Labs Lab 05/06/13 0557  NA 141  K 4.1  CL 103  CO2 23  GLUCOSE 155*  BUN 13  CREATININE 1.18  CALCIUM 9.2  MG 1.6  PHOS 3.1   Liver  Function Tests:  Recent Labs Lab 05/06/13 0557  AST 31  ALT 22  ALKPHOS 59  BILITOT 0.7  PROT 7.4  ALBUMIN 4.0   No results found for this basename: LIPASE, AMYLASE,  in the last 168 hours  Recent Labs Lab 05/06/13 0557  AMMONIA 13   CBC:  Recent Labs Lab 05/05/13 2058 05/06/13 0557  WBC 8.1 6.9  NEUTROABS 5.3  --   HGB 14.1 13.3  HCT 40.8 38.5*  MCV 84.1 83.3  PLT 130* 123*   Cardiac Enzymes: No results found for this basename: CKTOTAL, CKMB, CKMBINDEX, TROPONINI,  in the last 168 hours BNP (last 3 results) No results found for this basename: PROBNP,  in the last 8760 hours CBG:  Recent Labs Lab 05/05/13 2215 05/06/13 0634  GLUCAP 146* 139*    No results found for this or any previous visit (from the past 240 hour(s)).   Studies: Dg Chest Port 1 View  05/05/2013   CLINICAL DATA:  Seizure.  EXAM: PORTABLE CHEST - 1 VIEW  COMPARISON:  Plain film of the chest 05/05/2013 at 11:55 a.m. and PA and lateral chest 01/03/2011.  FINDINGS: The lungs are clear. Heart size is normal. No pneumothorax or pleural effusion.  IMPRESSION: No acute disease.   Electronically Signed   By: Drusilla Kanner M.D.   On: 05/05/2013 20:13    Scheduled Meds: . aspirin EC  325 mg Oral Daily  . heparin  5,000 Units Subcutaneous Q8H  . insulin aspart  0-15 Units Subcutaneous TID WC  . insulin aspart  0-5 Units Subcutaneous QHS  . multivitamin with minerals  1 tablet Oral Daily  . sodium chloride  3 mL Intravenous Q12H  . thiamine  100 mg Oral Daily   Continuous Infusions:   Principal Problem:   Acute encephalopathy Active Problems:   HYPERTENSION, UNSPECIFIED   CAD, NATIVE VESSEL   Seizure   DM type 2 (diabetes mellitus, type 2)    Time spent: 35 mins    [facility] Center[facility]OMPSON,Vincentina Sollers MD [facility] Pager [phone]. If 7PM-7AM, please contact night-coverage at [email or web address], password [email or web address] 05/06/2013, 10:53 AM  LOS: 1 day

## 2013-05-06 NOTE — Evaluation (Signed)
Occupational Therapy Evaluation Patient Details Name: [name] MRN: [id number] DOB: 10/06/1934 Today's Date: 05/06/2013 Time: 7829-5621 OT Time Calculation (min): 28 min  OT Assessment / Plan / Recommendation History of present illness  79 y.o. male admitted with confusion, weakness, unsteady gait and seizure activity. Pt with hx of HTN, DM2, CAD, back pain, incontinence, visual hallunications and s/p back surg. Pt with recent falls. family  taking the patient to see his neurosurgeon when he had a grand mal seizure activity enroute. Came to the emergency department at [facility]. And, when he was there he had an episode when he got combative and then stared straight ahead not responding to anybody. At that time there was no grand mal activity. Family also mentioned that patient has been very tremulous and these tremors have increased over the last 6 months. Decision was made for the patient to be transferred to [facility].    Clinical Impression   PT admitted with AMS, seizure, and gait deficits. Pt currently with functional limitiations due to the deficits listed below (see OT problem list).  Pt will benefit from skilled OT to increase their independence and safety with adls and balance to allow discharge CIR. Cognitive and balance deficits affecting all adls     OT Assessment  Patient needs continued OT Services    Follow Up Recommendations  CIR;Supervision/Assistance - 24 hour    Barriers to Discharge      Equipment Recommendations  Other (comment) (TBA)    Recommendations for Other Services Rehab consult  Frequency  Min 2X/week    Precautions / Restrictions Precautions Precautions: Fall Precaution Comments: monitor BP and HR Restrictions Weight Bearing Restrictions: No   Pertinent Vitals/Pain HR 135 See vitals    ADL  Eating/Feeding: Other (comment) (lunch present and declines food at this time) Toilet Transfer: Moderate assistance Toilet Transfer  Method: Sit to stand Toilet Transfer Equipment: Raised toilet seat with arms (or 3-in-1 over toilet) Equipment Used: Gait belt (hand held (A)) Transfers/Ambulation Related to ADLs: Pt with bed mobility HR incr 120s. pt static standing with HR 130. pt ambulated around the edge of the bed to opposite side with HR 135. Pt return to supine due to incr HR and decr BP. RN Villa Herb present in room and see vitals. ADL Comments: Pt laughing inappropriately to questions. pt avoiding questions and not directly answering questions. pt at other moments giving a response that appears to be language of confusion ( completely inappropriate unrelated). pt responding "i dont know " to orientation questions. pt oriented to name, city of residence being [place] and wife. Pt unable to tell therapist DOB , location, date, Pt session limited by HR and BP changes. Pt provided gait belt and asked to don like a belt. pt clicking gait belt together and unable to problem sovling don belt    OT Diagnosis: Generalized weakness;Cognitive deficits;Altered mental status  OT Problem List: Decreased strength;Impaired balance (sitting and/or standing);Decreased activity tolerance;Decreased cognition;Decreased safety awareness;Decreased knowledge of use of DME or AE;Decreased knowledge of precautions OT Treatment Interventions: Self-care/ADL training;Therapeutic exercise;Neuromuscular education;DME and/or AE instruction;Therapeutic activities;Cognitive remediation/compensation;Patient/family education;Balance training   OT Goals(Current goals can be found in the care plan section) Acute Rehab OT Goals Patient Stated Goal: pt unable to participate OT Goal Formulation: With patient/family Time For Goal Achievement: 05/20/13 Potential to Achieve Goals: Good  Visit Information  Last OT Received On: 05/06/13 Assistance Needed: +1 PT/OT/SLP Co-Evaluation/Treatment: Yes Reason for Co-Treatment: For patient/therapist safety PT goals addressed  during session:  Mobility/safety with mobility;Balance OT goals addressed during session: ADL's and self-care History of Present Illness:  79 y.o. male admitted with confusion, weakness, unsteady gait and seizure activity. Pt with hx of HTN, DM2, CAD, back pain, incontinence, visual hallunications and s/p back surg. Pt with recent falls. family  taking the patient to see his neurosurgeon when he had a grand mal seizure activity enroute. [facility]. And, when he was there he had an episode when he got combative and then stared straight ahead not responding to anybody. At that time there was no grand mal activity. Family also mentioned that patient has been very tremulous and these tremors have increased over the last 6 months. Decision was made for the [facility].        Prior Functioning     Home Living Family/patient expects to be discharged to:: Private residence Living Arrangements: Spouse/significant other Type of Home: House Home Access: Stairs to enter [employer]trance Stairs-Number of Steps: 2 Entrance Stairs-Rails: None Home Layout: One level Home Equipment: Environmental consultantWalker - 2 wheels;Cane - single point Prior Function Level of Independence: Independent Communication Communication: HOH Dominant Hand: Right         Vision/Perception Vision - History Visual History: Cataracts Vision - Assessment Eye Alignment: Within Functional Limits Vision Assessment: Vision not tested Additional Comments: cognitive deficits and following commands makes assessment diffcult at [employer]   Cognition  Cognition Arousal/Alertness: Awake/alert Behavior During Therapy: Flat affect;Impulsive Overall Cognitive Status: Impaired/Different from baseline Area of Impairment: Orientation;Attention;Memory;Safety/judgement;Awareness;Problem solving;Following commands Orientation Level: Disoriented to;Place;Time;Situation Current  Attention Level: Sustained Memory: Decreased recall of precautions;Decreased short-term memory Following Commands: Follows one step commands inconsistently;Follows one step commands with increased time Safety/Judgement: Decreased awareness of safety;Decreased awareness of deficits Awareness: Emergent;Anticipatory Problem Solving: Slow processing;Difficulty sequencing General Comments: Pt demonstrates deficits following 2 step commands, reports personal name and wife's name. unable to verbalize family names or relationship (x2 daughters and x2 granddaughters in addition to wife), states "i dont know" when asked DOB    Extremity/Trunk Assessment Upper Extremity Assessment Upper Extremity Assessment: Overall WFL for tasks assessed Lower Extremity Assessment Lower Extremity Assessment: Overall WFL for tasks assessed     Mobility Bed Mobility Overal bed mobility: Needs Assistance Bed Mobility: Supine to Sit Supine to sit: Max assist;HOB elevated General bed mobility comments: pt required (A) to sequence task and physcial (A) due to posterior lean Transfers Overall transfer level: Needs assistance Equipment used: 2 person hand held assist Transfers: Sit to/from Stand Sit to Stand: Mod assist General transfer comment: pt with LOB in all directions but greatest being posteriorly     Exercise     Balance Balance Overall balance assessment: Needs assistance Postural control: Posterior lean Standing balance support: During functional activity   End of Session OT - End of Session Activity Tolerance: Treatment limited secondary to medical complications (Comment) (HR incr) Patient left: in bed;with call bell/phone within reach;with family/visitor present;with bed alarm set Nurse Communication: Mobility status;Precautions  GO     Harolyn RutherfordJones, Shahin Knierim B 05/06/2013, 3:33 PM Pager: [phone]

## 2013-05-06 NOTE — Progress Notes (Signed)
VASCULAR LAB PRELIMINARY  PRELIMINARY  PRELIMINARY  PRELIMINARY  Carotid duplex completed.    Preliminary report:  Bilateral:  1-39% ICA stenosis.  Vertebral artery flow appears occluded on the right and antegrade on the left. No significant change since study of 08/30/2011.    Cliff Damiani, RVS 05/06/2013, 11:49 AM

## 2013-05-07 DIAGNOSIS — R569 Unspecified convulsions: Secondary | ICD-10-CM | POA: Diagnosis not present

## 2013-05-07 DIAGNOSIS — G40309 Generalized idiopathic epilepsy and epileptic syndromes, not intractable, without status epilepticus: Secondary | ICD-10-CM | POA: Diagnosis not present

## 2013-05-07 LAB — CBC
HCT: 43.8 % (ref 39.0–52.0)
Hemoglobin: 15.2 g/dL (ref 13.0–17.0)
MCH: 28.9 pg (ref 26.0–34.0)
MCHC: 34.7 g/dL (ref 30.0–36.0)
MCV: 83.3 fL (ref 78.0–100.0)
PLATELETS: 148 10*3/uL — AB (ref 150–400)
RBC: 5.26 MIL/uL (ref 4.22–5.81)
RDW: 13.6 % (ref 11.5–15.5)
WBC: 12.2 10*3/uL — ABNORMAL HIGH (ref 4.0–10.5)

## 2013-05-07 LAB — GLUCOSE, CAPILLARY
GLUCOSE-CAPILLARY: 232 mg/dL — AB (ref 70–99)
GLUCOSE-CAPILLARY: 175 mg/dL — AB (ref 70–99)
GLUCOSE-CAPILLARY: 159 mg/dL — AB (ref 70–99)

## 2013-05-07 LAB — BASIC METABOLIC PANEL
BUN: 12 mg/dL (ref 6–23)
CO2: 17 mEq/L — ABNORMAL LOW (ref 19–32)
Calcium: 10 mg/dL (ref 8.4–10.5)
Chloride: 99 mEq/L (ref 96–112)
Creatinine, Ser: 1.29 mg/dL (ref 0.50–1.35)
GFR calc Af Amer: 60 mL/min — ABNORMAL LOW (ref >90)
GFR calc non Af Amer: 52 mL/min — ABNORMAL LOW (ref >90)
GLUCOSE: 229 mg/dL — AB (ref 70–99)
Potassium: 3.7 mEq/L (ref 3.7–5.3)
SODIUM: 141 meq/L (ref 137–147)

## 2013-05-07 MED ORDER — DEXTROSE 5 % IV SOLN
10.0000 mg/kg | Freq: Three times a day (TID) | INTRAVENOUS | Status: DC
  Administered 2013-05-07 – 2013-05-08 (×3): 800 mg via INTRAVENOUS
  Filled 2013-05-07 (×7): qty 16

## 2013-05-07 MED ORDER — SODIUM CHLORIDE 0.9 % IV SOLN
INTRAVENOUS | Status: AC
  Administered 2013-05-07: 23:00:00 via INTRAVENOUS

## 2013-05-07 MED ORDER — LORAZEPAM 2 MG/ML IJ SOLN
2.0000 mg | Freq: Once | INTRAMUSCULAR | Status: AC
  Administered 2013-05-08: 2 mg via INTRAVENOUS
  Filled 2013-05-07 (×2): qty 1

## 2013-05-07 MED ORDER — HEPARIN SODIUM (PORCINE) 5000 UNIT/ML IJ SOLN
5000.0000 [IU] | Freq: Once | INTRAMUSCULAR | Status: AC
  Administered 2013-05-07: 5000 [IU] via SUBCUTANEOUS
  Filled 2013-05-07: qty 1

## 2013-05-07 MED ORDER — SODIUM CHLORIDE 0.9 % IV SOLN: INTRAVENOUS | Status: DC

## 2013-05-07 MED ORDER — INSULIN GLARGINE 100 UNIT/ML ~~LOC~~ SOLN
10.0000 [IU] | Freq: Every day | SUBCUTANEOUS | Status: DC
  Administered 2013-05-07 – 2013-05-08 (×2): 10 [IU] via SUBCUTANEOUS
  Filled 2013-05-07 (×4): qty 0.1

## 2013-05-07 NOTE — Progress Notes (Signed)
Pt finally asleep at 0030, tech not getting vital signs at this time, if pt wakes up tech will get vitals then. Will continue to monitor.

## 2013-05-07 NOTE — Progress Notes (Signed)
UR complete.  Urian Martenson RN, MSN 

## 2013-05-07 NOTE — Progress Notes (Signed)
ANTIBIOTIC CONSULT NOTE - INITIAL  Pharmacy Consult for Acyclovir Indication: CNS coverage  Allergies  Allergen Reactions  . Omnipaque [Iohexol]   . Codeine Nausea And Vomiting    Patient Measurements: Height: 5\' 7"  (170.2 cm) Weight: 176 lb 12.9 oz (80.2 kg) IBW/kg (Calculated) : 66.1  Vital Signs: Temp: 97.5 F (36.4 C) (01/21 1052) Temp src: Oral (01/21 1052) BP: 121/81 mmHg (01/21 1052) Pulse Rate: 124 (01/21 1052)  Labs:  Recent Labs  05/05/13 2058 05/06/13 0557 05/07/13 0433  WBC 8.1 6.9 12.2*  HGB 14.1 13.3 15.2  PLT 130* 123* 148*  CREATININE  --  1.18 1.29   Estimated Creatinine Clearance: 49.4 ml/min (by C-G formula based on Cr of 1.29).  Medical History: Past Medical History  Diagnosis Date  . Diabetes mellitus   . Hypertension   . Hyperlipidemia   . CAD (coronary artery disease)   . Leg pain     with walking  . Carotid artery occlusion    Assessment:   Admitted last night with new onset seizures.   To begin Acyclovir IV for empiric CNS coverage.    Creatinine has trended up a bit from yesterday;  Creatinine clearance is on the borderline for needing dosing interval adjustment.  Goal of Therapy:  appropriate Acyclovir dose for renal function and indication  Plan:   Acyclovir 800 mg (10 mg/kg) IV q8hrs.  Will follow up renal function for any need to adjust dosing interval.  Dennie Fettersgan, Tykeria Wawrzyniak Donovan, RPh Pager: [phone] 05/07/2013,11:47 AM

## 2013-05-07 NOTE — Progress Notes (Signed)
Subjective: Continues to be confused.   Exam: Filed Vitals:   05/07/13 1400  BP: 122/65  Pulse: 122  Temp: 97.8 F (36.6 C)  Resp: 20   Gen: In bed, NAD MS: Awake, but not oriented. Recognizes wife but not daughter.  CN: PERRL, EOMI Motor: MAEW Sensory:responds to nox stim x 4.   Impression: 79 yo M with initially subacute and now acute worsening of confusion. I suspect that this represents delirium superimposed on an underlying dementia(saw bugs, agitation, etc). Given the acuity and no clear source, however, will need to rule out other causes such as CNS infection. Clinically he does nto fit a bacterial meningitis, but HSV might be a possibility.   Recommendations: 1) LP with protein, glucose, cell count, hsv by pcr, culture, fungal smear 2) MRI brain 3) start acyclovir pending LP  Ritta SlotMcNeill Sims Laday, MD [facility] [phone]  If 7pm- 7am, please page neurology on call at [phone].

## 2013-05-07 NOTE — Progress Notes (Addendum)
Pt. Catheter bag was last emptied at noon and only had 400 ml.  There has been no output since then.  Catheter is in placed. Bladder scanned only .  MD paged./  Awaiting further orders.  Will continue to monitor patient.

## 2013-05-07 NOTE — Procedures (Addendum)
Indication: Altered mental status  Risks of the procedure were dicussed with the patient including post-LP headache, bleeding, infection, weakness/numbness of legs(radiculopathy), death.  The patient/patient's proxy agreed and written consent was obtained.   The patient was prepped and draped, and using sterile technique a 20 gauge quinke spinal needle was inserted in the L3-4 space. Despite multiple attempts, no CSF was obtained as bony resistance was repeatedly met.   At this time, will order LP under fluoroscopy and continue acyclovir pending results.   Roland Rack, MD [facility] [phone]  If 7pm- 7am, please page neurology on call at [phone].

## 2013-05-07 NOTE — Progress Notes (Signed)
Physical Therapy Treatment Patient Details Name: Ramar Nobrega MRN: [id number] DOB: May 05, 1934 Today's Date: 05/07/2013 Time: 4098-1191 PT Time Calculation (min): 25 min  PT Assessment / Plan / Recommendation  History of Present Illness  79 y.o. male admitted with confusion, weakness, unsteady gait and seizure activity. Pt with hx of HTN, DM2, CAD, back pain, incontinence, visual hallunications and s/p back surg. Pt with recent falls. family  taking the patient to see his neurosurgeon when he had a grand mal seizure activity enroute. Came to the emergency department at [facility]. And, when he was there he had an episode when he got combative and then stared straight ahead not responding to anybody. At that time there was no grand mal activity. Family also mentioned that patient has been very tremulous and these tremors have increased over the last 6 months. Decision was made for the patient to be transferred to [facility].    PT Comments   Patient demonstrates increased confusion today, required more assist for ambulation and elevated HR increased to 150's with activity.  Attempted some cognitive activities during mobility with intermittent appropriateness but poor recall. Will continue to see and progress as tolerated.  Follow Up Recommendations  CIR           Equipment Recommendations  Other (comment) (TBD)    Recommendations for Other Services Rehab consult  Frequency Min 3X/week   Progress towards PT Goals Progress towards PT goals: Progressing toward goals (modest progress, still limited by medical complications)  Plan Current plan remains appropriate    Precautions / Restrictions Precautions Precautions: Fall Precaution Comments: monitor BP and HR Restrictions Weight Bearing Restrictions: No   Pertinent Vitals/Pain HR elevated 150s with minimal ambulation (nsg aware) returned to 130s seated EOB    Mobility  Bed Mobility Overal bed mobility: Needs  Assistance Bed Mobility: Sit to Supine Sit to supine: Max assist General bed mobility comments: Assist to sequence, assist to elevate legs to bed and position Transfers Overall transfer level: Needs assistance Equipment used: 1 person hand held assist Sit to Stand: Mod assist General transfer comment: Patient assisted to standing from bed and bedside commode.  Ambulation/Gait Ambulation/Gait assistance: Max assist;+2 safety/equipment Ambulation Distance (Feet): 46 Feet Assistive device: 1 person hand held assist Gait Pattern/deviations: Step-to pattern;Shuffle;Ataxic;Staggering right;Drifts right/left Gait velocity: decreased Gait velocity interpretation: <1.8 ft/sec, indicative of risk for recurrent falls General Gait Details: Patient fully supported by therapist, staggering gait, max cues to direct to task, patient unable to mantain successive strides without cues to step forward. Patient with significant posterior lean. Easily distracted.      PT Goals (current goals can now be found in the care plan section) Acute Rehab PT Goals Patient Stated Goal: none stated PT Goal Formulation: With patient Time For Goal Achievement: 05/20/13 Potential to Achieve Goals: Good  Visit Information  Last PT Received On: 05/07/13 Assistance Needed: +2 (for safety ) History of Present Illness:  79 y.o. male admitted with confusion, weakness, unsteady gait and seizure activity. Pt with hx of HTN, DM2, CAD, back pain, incontinence, visual hallunications and s/p back surg. Pt with recent falls. family  taking the patient to see his neurosurgeon when he had a grand mal seizure activity enroute. Came to the emergency department at [name]. And, when he was there he had an episode when he got combative and then stared straight ahead not responding to anybody. At that time there was no grand mal activity. Family also mentioned that patient has been  very tremulous and these tremors have increased  over the last 6 months. [facility]     Subjective Data  Patient Stated Goal: none stated   Cognition  Cognition Arousal/Alertness: Awake/alert Behavior During Therapy: Flat affect;Impulsive Overall Cognitive Status: Impaired/Different from baseline Area of Impairment: Orientation;Attention;Memory;Safety/judgement;Awareness;Problem solving;Following commands Orientation Level: Disoriented to;Place;Time;Situation Current Attention Level: Sustained Memory: Decreased recall of precautions;Decreased short-term memory Following Commands: Follows one step commands inconsistently;Follows one step commands with increased time Safety/Judgement: Decreased awareness of safety;Decreased awareness of deficits Awareness: Emergent;Anticipatory Problem Solving: Slow processing;Difficulty sequencing General Comments: Pt significantly distracted max cues to task, very confused throughout session, poor overall cognition despite some moments of appropriateness    Balance  Balance Overall balance assessment: Needs assistance;History of Falls Sitting balance - Comments: could sit on bedside commode with stand by assist, but required min assist to sit upright on EOB Postural control: Posterior lean Standing balance support: During functional activity  End of Session PT - End of Session Equipment Utilized During Treatment: Gait belt Activity Tolerance: Treatment limited secondary to medical complications (Comment) (HR elevated again this session, 150s, nsg aware) Patient left: in bed;with call bell/phone within reach;with bed alarm set;with nursing/sitter in room;with family/visitor present;with restraints reapplied Nurse Communication: Mobility status   GP     Fabio AsaWerner, Aiyonna Lucado J 05/07/2013, 3:42 PM Charlotte Crumbevon Taner Rzepka, PT DPT  [phone]

## 2013-05-07 NOTE — Progress Notes (Signed)
[facility] PROGRESS NOTE  [facility] ZOX:[id number] DOB: Aug 29, 1934 DOA: 05/05/2013 PCP: Donzetta Sprung, MD  Assessment/Plan:  new onset seizures Personable etiology. Patient currently confused. Per family patient is cognitively intact at baseline. EEG is negative. MRI is pending. Continue seizure precautions. B12, sedimentation rate, RPR normal. TSH is normal. Neurology is following and appreciate input and recommendations- added acyclovir   acute encephalopathy Likely secondary to problem #1. B12, RPR, TSH within normal limits. Neurology following and appreciate input and recommendations.  type 2 diabetes Sliding scale insulin -add lantus for better coverage as BS is high.  hypertension -resume home meds  CAD/Carotid artery disease Status post stent to the RCA in 2007. Status post carotid endarterectomy in 2012. Continue aspirin and statin.  Code Status: Full Family Communication: updated patient and family. Disposition Plan: Pending workup.   Consultants: Neurology  Procedures:    Antibiotics:  acycolvir  HPI/Subjective: Patient required quite a bit of haldol yesterday as well as restraints Knows date but not where he is  Objective: Filed Vitals:   05/07/13 1052  BP: 121/81  Pulse: 124  Temp: 97.5 F (36.4 C)  Resp: 20   No intake or output data in the 24 hours ending 05/07/13 1114 Filed Weights   05/05/13 2300  Weight: 80.2 kg (176 lb 12.9 oz)    Exam:   General:  NAD  Cardiovascular: RRR  Respiratory: CTAB  Abdomen: SOFT/nt/nd/+bs  Musculoskeletal: No c/c/e  Neuro: Patient with some confusion, smiling,no focal neurological deficits.  Data Reviewed: Basic Metabolic Panel:  Recent Labs Lab 05/06/13 0557 05/07/13 0433  NA 141 141  K 4.1 3.7  CL 103 99  CO2 23 17*  GLUCOSE 155* 229*  BUN 13 12  CREATININE 1.18 1.29  CALCIUM 9.2 10.0  MG 1.6  --   PHOS 3.1  --    Liver Function Tests:  Recent Labs Lab  05/06/13 0557  AST 31  ALT 22  ALKPHOS 59  BILITOT 0.7  PROT 7.4  ALBUMIN 4.0   No results found for this basename: LIPASE, AMYLASE,  in the last 168 hours  Recent Labs Lab 05/06/13 0557  AMMONIA 13   CBC:  Recent Labs Lab 05/05/13 2058 05/06/13 0557 05/07/13 0433  WBC 8.1 6.9 12.2*  NEUTROABS 5.3  --   --   HGB 14.1 13.3 15.2  HCT 40.8 38.5* 43.8  MCV 84.1 83.3 83.3  PLT 130* 123* 148*   Cardiac Enzymes: No results found for this basename: CKTOTAL, CKMB, CKMBINDEX, TROPONINI,  in the last 168 hours BNP (last 3 results) No results found for this basename: PROBNP,  in the last 8760 hours CBG:  Recent Labs Lab 05/06/13 0634 05/06/13 1249 05/06/13 1629 05/06/13 2112 05/07/13 0635  GLUCAP 139* 139* 122* 144* 232*    No results found for this or any previous visit (from the past 240 hour(s)).   Studies: Dg Chest Port 1 View  05/05/2013   CLINICAL DATA:  Seizure.  EXAM: PORTABLE CHEST - 1 VIEW  COMPARISON:  Plain film of the chest 05/05/2013 at 11:55 a.m. and PA and lateral chest 01/03/2011.  FINDINGS: The lungs are clear. Heart size is normal. No pneumothorax or pleural effusion.  IMPRESSION: No acute disease.   Electronically Signed   By: Drusilla Kanner M.D.   On: 05/05/2013 20:13    Scheduled Meds: . acyclovir  10 mg/kg Intravenous Q8H  . aspirin EC  325 mg Oral Daily  . atorvastatin  40 mg Oral q1800  .  heparin  5,000 Units Subcutaneous Q8H  . hydrochlorothiazide  12.5 mg Oral Daily  . insulin aspart  0-15 Units Subcutaneous TID WC  . insulin aspart  0-5 Units Subcutaneous QHS  . insulin glargine  10 Units Subcutaneous Daily  . lisinopril  20 mg Oral Daily  . LORazepam  2 mg Intravenous Once  . multivitamin with minerals  1 tablet Oral Daily  . sodium chloride  3 mL Intravenous Q12H  . thiamine  100 mg Oral Daily   Continuous Infusions:   Principal Problem:   Acute encephalopathy Active Problems:   HYPERTENSION, UNSPECIFIED   CAD, NATIVE  VESSEL   Seizure   DM type 2 (diabetes mellitus, type 2)    Time spent: 35 mins    Gordana Kewley DO [facility] Pager [phone]. If 7PM-7AM, please contact night-coverage at [email or web address], password [email or web address] 05/07/2013, 11:14 AM  LOS: 2 days

## 2013-05-08 ENCOUNTER — Inpatient Hospital Stay (HOSPITAL_COMMUNITY): Payer: Medicare Other

## 2013-05-08 LAB — BASIC METABOLIC PANEL
BUN: 18 mg/dL (ref 6–23)
CALCIUM: 8.8 mg/dL (ref 8.4–10.5)
CO2: 19 mEq/L (ref 19–32)
Chloride: 102 mEq/L (ref 96–112)
Creatinine, Ser: 1.5 mg/dL — ABNORMAL HIGH (ref 0.50–1.35)
GFR calc non Af Amer: 43 mL/min — ABNORMAL LOW (ref >90)
GFR, EST AFRICAN AMERICAN: 50 mL/min — AB (ref >90)
GLUCOSE: 216 mg/dL — AB (ref 70–99)
Potassium: 3.9 mEq/L (ref 3.7–5.3)
Sodium: 139 mEq/L (ref 137–147)

## 2013-05-08 LAB — CBC
HCT: 41 % (ref 39.0–52.0)
HEMOGLOBIN: 13.9 g/dL (ref 13.0–17.0)
MCH: 28.4 pg (ref 26.0–34.0)
MCHC: 33.9 g/dL (ref 30.0–36.0)
MCV: 83.7 fL (ref 78.0–100.0)
PLATELETS: 145 10*3/uL — AB (ref 150–400)
RBC: 4.9 MIL/uL (ref 4.22–5.81)
RDW: 13.8 % (ref 11.5–15.5)
WBC: 12.4 10*3/uL — ABNORMAL HIGH (ref 4.0–10.5)

## 2013-05-08 LAB — GLUCOSE, CAPILLARY
GLUCOSE-CAPILLARY: 184 mg/dL — AB (ref 70–99)
Glucose-Capillary: 180 mg/dL — ABNORMAL HIGH (ref 70–99)
Glucose-Capillary: 168 mg/dL — ABNORMAL HIGH (ref 70–99)
Glucose-Capillary: 138 mg/dL — ABNORMAL HIGH (ref 70–99)
Glucose-Capillary: 103 mg/dL — ABNORMAL HIGH (ref 70–99)

## 2013-05-08 LAB — GRAM STAIN: SPECIAL REQUESTS: NORMAL

## 2013-05-08 LAB — CSF CELL COUNT WITH DIFFERENTIAL
RBC Count, CSF: 0 /mm3
TUBE #: 1
WBC CSF: 5 /mm3 (ref 0–5)

## 2013-05-08 LAB — PROTEIN AND GLUCOSE, CSF
Glucose, CSF: 112 mg/dL — ABNORMAL HIGH (ref 43–76)
TOTAL PROTEIN, CSF: 68 mg/dL — AB (ref 15–45)

## 2013-05-08 MED ORDER — INSULIN ASPART 100 UNIT/ML ~~LOC~~ SOLN
4.0000 [IU] | Freq: Three times a day (TID) | SUBCUTANEOUS | Status: DC
  Administered 2013-05-08 – 2013-05-13 (×6): 4 [IU] via SUBCUTANEOUS

## 2013-05-08 MED ORDER — POLYETHYLENE GLYCOL 3350 17 G PO PACK
17.0000 g | PACK | Freq: Every day | ORAL | Status: DC
  Administered 2013-05-08 – 2013-05-14 (×6): 17 g via ORAL
  Filled 2013-05-08 (×7): qty 1

## 2013-05-08 MED ORDER — LORAZEPAM 2 MG/ML IJ SOLN
1.0000 mg | Freq: Once | INTRAMUSCULAR | Status: AC
  Administered 2013-05-08: 1 mg via INTRAVENOUS
  Filled 2013-05-08: qty 1

## 2013-05-08 MED ORDER — INSULIN GLARGINE 100 UNIT/ML ~~LOC~~ SOLN
15.0000 [IU] | Freq: Every day | SUBCUTANEOUS | Status: DC
  Administered 2013-05-09 – 2013-05-13 (×5): 15 [IU] via SUBCUTANEOUS
  Filled 2013-05-08 (×5): qty 0.15

## 2013-05-08 MED ORDER — DEXTROSE 5 % IV SOLN
10.0000 mg/kg | Freq: Two times a day (BID) | INTRAVENOUS | Status: DC
  Administered 2013-05-08: 800 mg via INTRAVENOUS
  Filled 2013-05-08 (×2): qty 16

## 2013-05-08 NOTE — Progress Notes (Signed)
[facility] PROGRESS NOTE  [facility] ZOX:[id number] DOB: March 20, 1935 DOA: 05/05/2013 PCP: Donzetta Sprung, MD  Assessment/Plan:  new onset seizures Personable etiology. Patient currently confused but not as aggitated  EEG is negative.  MRI is pending.  Continue seizure precautions.  B12, sedimentation rate, RPR normal. TSH is normal.  Neurology is following and appreciate input and recommendations- added acyclovir   acute encephalopathy Likely secondary to problem #1. B12, RPR, TSH within normal limits. Neurology following and appreciate input and recommendations.  type 2 diabetes Sliding scale insulin -increase lantus -add novolog with meals  hypertension -resume home meds  CAD/Carotid artery disease Status post stent to the RCA in 2007. Status post carotid endarterectomy in 2012. Continue aspirin and statin.  Code Status: Full Family Communication: updated patient and family. Disposition Plan: .   Consultants: Neurology  Procedures:    Antibiotics:  acycolvir  HPI/Subjective: Not as agitated but still confused No SOB, no CP  Objective: Filed Vitals:   05/08/13 0506  BP: 146/62  Pulse: 113  Temp: 97.6 F (36.4 C)  Resp: 20    Intake/Output Summary (Last 24 hours) at 05/08/13 1208 Last data filed at 05/08/13 0700  Gross per 24 hour  Intake    240 ml  Output    800 ml  Net   -560 ml   Filed Weights   05/05/13 2300  Weight: 80.2 kg (176 lb 12.9 oz)    Exam:   General:  NAD  Cardiovascular: RRR  Respiratory: CTAB  Abdomen: SOFT/nt/nd/+bs  Musculoskeletal: No c/c/e  Neuro: Patient with some confusion, smiling,no focal neurological deficits.  Data Reviewed: Basic Metabolic Panel:  Recent Labs Lab 05/06/13 0557 05/07/13 0433 05/08/13 0530  NA 141 141 139  K 4.1 3.7 3.9  CL 103 99 102  CO2 23 17* 19  GLUCOSE 155* 229* 216*  BUN 13 12 18   CREATININE 1.18 1.29 1.50*  CALCIUM 9.2 10.0 8.8  MG 1.6  --   --   PHOS 3.1   --   --    Liver Function Tests:  Recent Labs Lab 05/06/13 0557  AST 31  ALT 22  ALKPHOS 59  BILITOT 0.7  PROT 7.4  ALBUMIN 4.0   No results found for this basename: LIPASE, AMYLASE,  in the last 168 hours  Recent Labs Lab 05/06/13 0557  AMMONIA 13   CBC:  Recent Labs Lab 05/05/13 2058 05/06/13 0557 05/07/13 0433 05/08/13 0530  WBC 8.1 6.9 12.2* 12.4*  NEUTROABS 5.3  --   --   --   HGB 14.1 13.3 15.2 13.9  HCT 40.8 38.5* 43.8 41.0  MCV 84.1 83.3 83.3 83.7  PLT 130* 123* 148* 145*   Cardiac Enzymes: No results found for this basename: CKTOTAL, CKMB, CKMBINDEX, TROPONINI,  in the last 168 hours BNP (last 3 results) No results found for this basename: PROBNP,  in the last 8760 hours CBG:  Recent Labs Lab 05/07/13 1121 05/07/13 1627 05/07/13 2247 05/08/13 0657 05/08/13 1106  GLUCAP 175* 159* 180* 184* 168*    No results found for this or any previous visit (from the past 240 hour(s)).   Studies: No results found.  Scheduled Meds: . acyclovir  10 mg/kg Intravenous Q12H  . aspirin EC  325 mg Oral Daily  . atorvastatin  40 mg Oral q1800  . hydrochlorothiazide  12.5 mg Oral Daily  . insulin aspart  0-15 Units Subcutaneous TID WC  . insulin aspart  0-5 Units Subcutaneous QHS  . insulin  glargine  10 Units Subcutaneous Daily  . lisinopril  20 mg Oral Daily  . multivitamin with minerals  1 tablet Oral Daily  . sodium chloride  3 mL Intravenous Q12H  . thiamine  100 mg Oral Daily   Continuous Infusions:   Principal Problem:   Acute encephalopathy Active Problems:   HYPERTENSION, UNSPECIFIED   CAD, NATIVE VESSEL   Seizure   DM type 2 (diabetes mellitus, type 2)    Time spent: 35 mins    Latayna Ritchie DO [facility] Pager [phone]. If 7PM-7AM, please contact night-coverage at [email or web address], password [email or web address] 05/08/2013, 12:08 PM  LOS: 3 days

## 2013-05-08 NOTE — Consult Note (Signed)
Physical Medicine and Rehabilitation Consult Reason for Consult:acute encep. Referring Physician: [facility]   HPI: [name] is a 79 y.o. right handed male with history of hypertension, coronary artery disease with stent placement, diabetes mellitus and peripheral neuropathy and left carotid surgery. Admitted 05/05/2013 with altered mental status question seizure and unsteady gait for the past few weeks. He was [facility] where reports of cranial CT scan unremarkable. He was [facility] for ongoing evaluation. Echocardiogram with grade 1 diastolic dysfunction. Carotid Dopplers showed vertebral artery flow appeared occluded on the right and antegrade  on the left. No significant change since study of 08/20/2011. EEG negative. Patient did not receive TPA. Neurology services followup for her altered mental status suspect acute encephalopathy with MRI of the brain  pending as well as lumbar puncture. Presently patient maintained on aspirin therapy as well as subcutaneous heparin for DVT prophylaxis. Physical therapy evaluation completed an ongoing with recommendations for physical medicine rehabilitation consult to consider inpatient rehabilitation services.  Review of Systems  Unable to perform ROS: mental acuity   Past Medical History  Diagnosis Date  . Diabetes mellitus   . Hypertension   . Hyperlipidemia   . CAD (coronary artery disease)   . Leg pain     with walking  . Carotid artery occlusion    Past Surgical History  Procedure Laterality Date  . Back surgery  07/10/11  . Carotid endarterectomy  01/10/11    Left Carotid  . Coronary stent placement  08/09/2005    Right  . Spine surgery  07/10/11   Family History  Problem Relation Age of Onset  . Hypertension Mother   . Deep vein thrombosis Mother   . Diabetes Mother   . Heart disease Mother   . COPD Father   . Heart disease Sister   . Heart attack Sister     Bypass  . Heart  disease Brother     Heart Disease before age 77  . Heart disease Brother   . Hypertension Son    Social History:  reports that he quit smoking about 32 years ago. His smoking use included Cigarettes. He smoked 0.00 packs per day. He does not have any smokeless tobacco history on file. He reports that he does not drink alcohol or use illicit drugs. Allergies:  Allergies  Allergen Reactions  . Omnipaque [Iohexol]   . Codeine Nausea And Vomiting   Medications Prior to Admission  Medication Sig Dispense Refill  . amLODipine (NORVASC) 10 MG tablet Take 10 mg by mouth daily.      Marland Kitchen. aspirin 81 MG tablet Take 81 mg by mouth daily.        . clonazePAM (KLONOPIN) 1 MG tablet Take 1 mg by mouth 2 (two) times daily.      Marland Kitchen. gabapentin (NEURONTIN) 300 MG capsule Take 300 mg by mouth 2 (two) times daily.       Marland Kitchen. GLIPIZIDE XL 5 MG 24 hr tablet Take 5 mg by mouth Daily.      Marland Kitchen. lisinopril-hydrochlorothiazide (PRINZIDE,ZESTORETIC) 20-12.5 MG per tablet Take 1 tablet by mouth daily.        . metFORMIN (GLUMETZA) 500 MG (MOD) 24 hr tablet Take 1,000 mg by mouth 2 (two) times daily with a meal.       . omeprazole (PRILOSEC) 20 MG capsule Take 20 mg by mouth Daily.      . simvastatin (ZOCOR) 80 MG tablet Take 80 mg by mouth at  bedtime.        . traZODone (DESYREL) 50 MG tablet Take 50 mg by mouth at bedtime.         Home: Home Living Family/patient expects to be discharged to:: Private residence Living Arrangements: Spouse/significant other Type of Home: House Home Access: Stairs to enter [employer] of Steps: 2 Entrance Stairs-Rails: None Home Layout: One level Home Equipment: Environmental consultant - 2 wheels;Cane - single point Additional Comments:    Functional History:   Functional Status:  Mobility:     Ambulation/Gait Ambulation Distance (Feet): 46 Feet Gait velocity: decreased General Gait Details: Patient fully supported by therapist, staggering gait, max cues to direct to task, patient  unable to mantain successive strides without cues to step forward. Patient with significant posterior lean. Easily distracted.    ADL: ADL Eating/Feeding: Other (comment) (lunch present and declines food at this time) Toilet Transfer: Moderate assistance Toilet Transfer Method: Sit to stand Toilet Transfer Equipment: Raised toilet seat with arms (or 3-in-1 over toilet) Equipment Used: Gait belt (hand held (A)) Transfers/Ambulation Related to ADLs: Pt with bed mobility HR incr 120s. pt static standing with HR 130. pt ambulated around the edge of the bed to opposite side with HR 135. Pt return to supine due to incr HR and decr BP. RN Villa Herb present in room and see vitals. ADL Comments: Pt laughing inappropriately to questions. pt avoiding questions and not directly answering questions. pt at other moments giving a response that appears to be language of confusion ( completely inappropriate unrelated). pt responding "i dont know " to orientation questions. pt oriented to name, city of residence being [place] and wife. Pt unable to tell therapist DOB , location, date, Pt session limited by HR and BP changes. Pt provided gait belt and asked to don like a belt. pt clicking gait belt together and unable to problem sovling don belt  Cognition: Cognition Overall Cognitive Status: Impaired/Different from baseline Orientation Level: Oriented to person;Disoriented to time;Disoriented to situation;Disoriented to place Cognition Arousal/Alertness: Awake/alert Behavior During Therapy: Flat affect;Impulsive Overall Cognitive Status: Impaired/Different from baseline Area of Impairment: Orientation;Attention;Memory;Safety/judgement;Awareness;Problem solving;Following commands Orientation Level: Disoriented to;Place;Time;Situation Current Attention Level: Sustained Memory: Decreased recall of precautions;Decreased short-term memory Following Commands: Follows one step commands inconsistently;Follows one step  commands with increased time Safety/Judgement: Decreased awareness of safety;Decreased awareness of deficits Awareness: Emergent;Anticipatory Problem Solving: Slow processing;Difficulty sequencing General Comments: Pt significantly distracted max cues to task, very confused throughout session, poor overall cognition despite some moments of appropriateness  Blood pressure 146/62, pulse 113, temperature 97.6 F (36.4 C), temperature source Oral, resp. rate 20, height 5\' 7"  (1.702 m), weight 80.2 kg (176 lb 12.9 oz), SpO2 89.00%. Physical Exam  Vitals reviewed. HENT:  Head: Normocephalic.  Eyes:  Pupils reactive to light  Neck: Normal range of motion. Neck supple. No thyromegaly present.  Cardiovascular: Normal rate and regular rhythm.   Respiratory: Effort normal and breath sounds normal. No respiratory distress.  GI: Soft. Bowel sounds are normal. He exhibits no distension.  Neurological:  Physical exam limited due to lethargy and inability to participate with exam. Oriented to name and [facility]. Thought he was in [facility].  He was very inconsistent. He could name some family members  in the room. Moved all 4's. sidelying in bed. Intentional tremor in both UE's noted. Senses pain in all 4 limbs.   Psychiatric:  Confused, limited insight and awareness.     Results for orders placed during the hospital encounter of 05/05/13 (from the  past 24 hour(s))  GLUCOSE, CAPILLARY     Status: Abnormal   Collection Time    05/07/13  4:27 PM      Result Value Range   Glucose-Capillary 159 (*) 70 - 99 mg/dL  GLUCOSE, CAPILLARY     Status: Abnormal   Collection Time    05/07/13 10:47 PM      Result Value Range   Glucose-Capillary 180 (*) 70 - 99 mg/dL   Comment 1 Notify RN     Comment 2 Documented in Chart    CBC     Status: Abnormal   Collection Time    05/08/13  5:30 AM      Result Value Range   WBC 12.4 (*) 4.0 - 10.5 K/uL   RBC 4.90  4.22 - 5.81 MIL/uL   Hemoglobin 13.9  13.0 - 17.0  g/dL   HCT 47.8  29.5 - 62.1 %   MCV 83.7  78.0 - 100.0 fL   MCH 28.4  26.0 - 34.0 pg   MCHC 33.9  30.0 - 36.0 g/dL   RDW 30.8  65.7 - 84.6 %   Platelets 145 (*) 150 - 400 K/uL  BASIC METABOLIC PANEL     Status: Abnormal   Collection Time    05/08/13  5:30 AM      Result Value Range   Sodium 139  137 - 147 mEq/L   Potassium 3.9  3.7 - 5.3 mEq/L   Chloride 102  96 - 112 mEq/L   CO2 19  19 - 32 mEq/L   Glucose, Bld 216 (*) 70 - 99 mg/dL   BUN 18  6 - 23 mg/dL   Creatinine, Ser 9.62 (*) 0.50 - 1.35 mg/dL   Calcium 8.8  8.4 - 95.2 mg/dL   GFR calc non Af Amer 43 (*) >90 mL/min   GFR calc Af Amer 50 (*) >90 mL/min  GLUCOSE, CAPILLARY     Status: Abnormal   Collection Time    05/08/13  6:57 AM      Result Value Range   Glucose-Capillary 184 (*) 70 - 99 mg/dL   Comment 1 Notify RN     Comment 2 Documented in Chart    GLUCOSE, CAPILLARY     Status: Abnormal   Collection Time    05/08/13 11:06 AM      Result Value Range   Glucose-Capillary 168 (*) 70 - 99 mg/dL   Comment 1 Documented in Chart     No results found.  Assessment/Plan: Diagnosis: PRES 1. Does the need for close, 24 hr/day medical supervision in concert with the patient's rehab needs make it unreasonable for this patient to be served in a less intensive setting? Yes 2. Co-Morbidities requiring supervision/potential complications: dm2, cad, seizures 3. Due to bladder management, bowel management, safety, skin/wound care, disease management, medication administration, pain management and patient education, does the patient require 24 hr/day rehab nursing? Yes 4. Does the patient require coordinated care of a physician, rehab nurse, PT (1-2 hrs/day, 5 days/week), OT (1-2 hrs/day, 5 days/week) and SLP (1-2 hrs/day, 5 days/week) to address physical and functional deficits in the context of the above medical diagnosis(es)? Yes Addressing deficits in the following areas: balance, endurance, locomotion, strength, transferring,  bowel/bladder control, bathing, dressing, feeding, grooming, toileting, cognition, speech and psychosocial support 5. Can the patient actively participate in an intensive therapy program of at least 3 hrs of therapy per day at least 5 days per week? Yes 6. The potential for  patient to make measurable gains while on inpatient rehab is excellent 7. Anticipated functional outcomes upon discharge from inpatient rehab are supervision with PT, supervision with OT, supervision to min assist with SLP. 8. Estimated rehab length of stay to reach the above functional goals is: likely 10-17 days 9. Does the patient have adequate social supports to accommodate these discharge functional goals? Yes 10. Anticipated D/C setting: Home 11. Anticipated post D/C treatments: HH therapy 12. Overall Rehab/Functional Prognosis: good  RECOMMENDATIONS: This patient's condition is appropriate for continued rehabilitative care in the following setting: CIR Patient has agreed to participate in recommended program. Yes (family) Note that insurance prior authorization may be required for reimbursement for recommended care.  Comment: Appears to have good family supports. Had gait issues and numerous falls for 6 months leading up to this hospitalization. Rehab Admissions Coordinator to follow up.  Thanks,  Ranelle Oyster, MD, [place]     05/08/2013

## 2013-05-08 NOTE — Procedures (Signed)
Diagnostic lumbar puncture performed on the left at L2-3 after obtaining informed consent from the patient's wife. No immediate complications. 8 ml of CSF withdrawn for the requested studies. Details dictated in radiology report.

## 2013-05-08 NOTE — Progress Notes (Signed)
ANTIBIOTIC CONSULT NOTE - INITIAL  Pharmacy Consult for Acyclovir Indication: CNS coverage  Allergies  Allergen Reactions  . Omnipaque [Iohexol]   . Codeine Nausea And Vomiting    Patient Measurements: Height: 5\' 7"  (170.2 cm) Weight: 176 lb 12.9 oz (80.2 kg) IBW/kg (Calculated) : 66.1  Vital Signs: Temp: 97.6 F (36.4 C) (01/22 0506) Temp src: Oral (01/22 0506) BP: 146/62 mmHg (01/22 0506) Pulse Rate: 113 (01/22 0506)  Labs:  Recent Labs  05/06/13 0557 05/07/13 0433 05/08/13 0530  WBC 6.9 12.2* 12.4*  HGB 13.3 15.2 13.9  PLT 123* 148* 145*  CREATININE 1.18 1.29 1.50*   Estimated Creatinine Clearance: 42.5 ml/min (by C-G formula based on Cr of 1.5).  Medical History: Past Medical History  Diagnosis Date  . Diabetes mellitus   . Hypertension   . Hyperlipidemia   . CAD (coronary artery disease)   . Leg pain     with walking  . Carotid artery occlusion    Assessment:   Admitted for new onset seizures.   Acyclovir IV for empiric CNS coverage.   Creatinine has trended up a bit. Will adjust acyclovir today.  Goal of Therapy:  appropriate Acyclovir dose for renal function and indication  Plan:    Acyclovir 800 mg (10 mg/kg) IV q12hrs.  Will follow up renal function for any need to adjust dosing interval.   Ulyses SouthwardMinh Pham, PharmD Pager: [phone] 05/08/2013 8:22 AM

## 2013-05-08 NOTE — Progress Notes (Signed)
Subjective: Continues to be confused, but less agitated overnight. Got ativan around 4am because he was awake.   Exam: Filed Vitals:   05/08/13 0506  BP: 146/62  Pulse: 113  Temp: 97.6 F (36.4 C)  Resp: 20   Gen: In bed, NAD MS: Awake, but not oriented. Recognizes wife but not daughter.  CN: PERRL, EOMI Motor: MAEW Sensory:responds to nox stim x 4.   Impression: 79 yo M with initially subacute and now acute worsening of confusion. I suspect that this represents delirium superimposed on an underlying dementia(saw bugs, agitation, etc). Given the acuity and no clear source, however, will need to rule out other causes such as CNS infection. Clinically he does not fit a bacterial meningitis, but HSV might be a possibility.   Recommendations: 1) LP with protein, glucose, cell count, hsv by pcr, culture, fungal smear 2) MRI brain w/wo contrast.  3) acyclovir pending LP.  4) limit sedating medications unless patient is agitated.   Ritta SlotMcNeill Kirkpatrick, MD [facility] [phone]  If 7pm- 7am, please page neurology on call at [phone].

## 2013-05-09 LAB — BASIC METABOLIC PANEL
BUN: 17 mg/dL (ref 6–23)
CALCIUM: 9.3 mg/dL (ref 8.4–10.5)
CHLORIDE: 102 meq/L (ref 96–112)
CO2: 20 meq/L (ref 19–32)
CREATININE: 1.48 mg/dL — AB (ref 0.50–1.35)
GFR calc Af Amer: 51 mL/min — ABNORMAL LOW (ref >90)
GFR calc non Af Amer: 44 mL/min — ABNORMAL LOW (ref >90)
GLUCOSE: 143 mg/dL — AB (ref 70–99)
Potassium: 4.3 mEq/L (ref 3.7–5.3)
Sodium: 140 mEq/L (ref 137–147)

## 2013-05-09 LAB — CBC
HEMATOCRIT: 41.6 % (ref 39.0–52.0)
Hemoglobin: 14.3 g/dL (ref 13.0–17.0)
MCH: 29.1 pg (ref 26.0–34.0)
MCHC: 34.4 g/dL (ref 30.0–36.0)
MCV: 84.6 fL (ref 78.0–100.0)
PLATELETS: 142 10*3/uL — AB (ref 150–400)
RBC: 4.92 MIL/uL (ref 4.22–5.81)
RDW: 13.9 % (ref 11.5–15.5)
WBC: 11.1 10*3/uL — ABNORMAL HIGH (ref 4.0–10.5)

## 2013-05-09 LAB — HERPES SIMPLEX VIRUS(HSV) DNA BY PCR
HSV 1 DNA: NOT DETECTED
HSV 2 DNA: NOT DETECTED

## 2013-05-09 LAB — GLUCOSE, CAPILLARY
GLUCOSE-CAPILLARY: 155 mg/dL — AB (ref 70–99)
GLUCOSE-CAPILLARY: 140 mg/dL — AB (ref 70–99)
GLUCOSE-CAPILLARY: 131 mg/dL — AB (ref 70–99)
Glucose-Capillary: 203 mg/dL — ABNORMAL HIGH (ref 70–99)

## 2013-05-09 MED ORDER — CARVEDILOL 6.25 MG PO TABS
6.2500 mg | ORAL_TABLET | Freq: Two times a day (BID) | ORAL | Status: DC
  Filled 2013-05-09 (×2): qty 1

## 2013-05-09 MED ORDER — ACYCLOVIR SODIUM 50 MG/ML IV SOLN
650.0000 mg | Freq: Two times a day (BID) | INTRAVENOUS | Status: DC
  Filled 2013-05-09: qty 13

## 2013-05-09 MED ORDER — DEXTROSE 5 % IV SOLN
660.0000 mg | Freq: Two times a day (BID) | INTRAVENOUS | Status: DC
  Administered 2013-05-09 (×2): 660 mg via INTRAVENOUS
  Filled 2013-05-09 (×3): qty 13.2

## 2013-05-09 MED ORDER — CARVEDILOL 12.5 MG PO TABS
12.5000 mg | ORAL_TABLET | Freq: Two times a day (BID) | ORAL | Status: DC
  Administered 2013-05-09 – 2013-05-14 (×11): 12.5 mg via ORAL
  Filled 2013-05-09 (×12): qty 1

## 2013-05-09 NOTE — Progress Notes (Signed)
Orthostatics  Lying 108/71 Sitting 116/78 Standing 116/74

## 2013-05-09 NOTE — Progress Notes (Signed)
Physical Therapy Treatment Patient Details Name: Era BumpersCleveland Brackens MRN: [id number] DOB: 07/08/1934 Today's Date: 05/09/2013 Time: 1132-1209 PT Time Calculation (min): 37 min  PT Assessment / Plan / Recommendation  History of Present Illness  79 y.o. male admitted with confusion, weakness, unsteady gait and seizure activity. Pt with hx of HTN, DM2, CAD, back pain, incontinence, visual hallunications and s/p back surg. Pt with recent falls. family  taking the patient to see his neurosurgeon when he had a grand mal seizure activity enroute. [facility]. And, when he was there he had an episode when he got combative and then stared straight ahead not responding to anybody. At that time there was no grand mal activity. Family also mentioned that patient has been very tremulous and these tremors have increased over the last 6 months. Decision was made for the patient to be [facility].    PT Comments   Patient demonstrates improvements in cognition and mobility today. Patient able to carry over instructions from beginning of session. Patient mobilizing with less assist but continues to have multiple LOB. Feel patient is ideal candidate for CIR. Will continue to see and progress as tolerated. Family educated on techniques to assist with reorienting patient and way to decrease sun downing. Educated family on patient progression and needs for rehab. Family appreciative.   Follow Up Recommendations  CIR     Does the patient have the potential to tolerate intense rehabilitation     Barriers to Discharge        Equipment Recommendations  Other (comment) (TBD)    Recommendations for Other Services Rehab consult  Frequency Min 3X/week   Progress towards PT Goals Progress towards PT goals: Progressing toward goals  Plan Current plan remains appropriate    Precautions / Restrictions Precautions Precautions: Fall Precaution Comments:  monitor BP and HR Restrictions Weight Bearing Restrictions: No   Pertinent Vitals/Pain No pain reported at this time, VSS    Mobility  Bed Mobility Overal bed mobility: Needs Assistance Bed Mobility: Sit to Supine Sit to supine: Min assist General bed mobility comments: Assist to sequence, assist to elevate legs to bed and position Transfers Overall transfer level: Needs assistance Equipment used: None Transfers: Sit to/from Stand Sit to Stand: Min assist General transfer comment: Patient assisted to standing from bed and bedside commode.  Ambulation/Gait Ambulation/Gait assistance: Min to Mod assist; Occasional Max assist for LOB during higher level ambulation (ambulation with cognitive tasks) Ambulation Distance (Feet): 380 Feet Assistive device: 1 person hand held assist Gait Pattern/deviations: Step-to pattern;Shuffle;Ataxic;Staggering right;Drifts right/left Gait velocity: decreased Gait velocity interpretation: Below normal speed for age/gender General Gait Details: Patient fully supported by therapist, staggering gait, max cues to direct to task, patient unable to mantain successive strides without cues to step forward. Patient with significant posterior lean. Easily distracted.      PT Goals (current goals can now be found in the care plan section) Acute Rehab PT Goals Patient Stated Goal: none stated PT Goal Formulation: With patient Time For Goal Achievement: 05/20/13 Potential to Achieve Goals: Good  Visit Information  Last PT Received On: 05/09/13 Assistance Needed: +1 History of Present Illness:  79 y.o. male admitted with confusion, weakness, unsteady gait and seizure activity. Pt with hx of HTN, DM2, CAD, back pain, incontinence, visual hallunications and s/p back surg. Pt with recent falls. family  taking the patient to see his neurosurgeon when he had a grand mal seizure activity enroute. Came  to the emergency department at [facility]. And, when he was  there he had an episode when he got combative and then stared straight ahead not responding to anybody. At that time there was no grand mal activity. Family also mentioned that patient has been very tremulous and these tremors have increased over the last 6 months. Decision was made for the patient to be transferred to [facility]     Subjective Data  Subjective: I will do some walking Patient Stated Goal: none stated   Cognition  Cognition Arousal/Alertness: Awake/alert Behavior During Therapy: Flat affect;Impulsive Overall Cognitive Status: Impaired/Different from baseline Area of Impairment: Orientation;Attention;Memory;Safety/judgement;Awareness;Problem solving;Following commands Orientation Level: Disoriented to;Place;Time;Situation Current Attention Level: Sustained Memory: Decreased recall of precautions;Decreased short-term memory Following Commands: Follows one step commands consistently Safety/Judgement: Decreased awareness of safety Awareness: Emergent;Anticipatory General Comments: Cognition improving, patient able to demonstrate good carry over throughout tasks. Able to recall without cues.    Balance  Balance Overall balance assessment: History of Falls Sitting-balance support: Feet supported Sitting balance-Leahy Scale: Fair Postural control: Posterior lean Standing balance support: During functional activity Standing balance-Leahy Scale: Fair  End of Session PT - End of Session Equipment Utilized During Treatment: Gait belt Activity Tolerance: Patient tolerated treatment well Patient left: in bed;with call bell/phone within reach;with bed alarm set;with nursing/sitter in room;with family/visitor present Nurse Communication: Mobility status   GP     Fabio Asa 05/09/2013, 1:01 PM Charlotte Crumb, PT DPT  [phone]

## 2013-05-09 NOTE — Progress Notes (Signed)
Rehab Admissions - We spoke with pt and multiple family members. Pt's wife was very interested in the possibility of inpatient rehab. Explained that pt's insurance was [employer]. [facility] and will follow up on Monday. Thanks,  Juliann MuleJanine Jamal Haskin, PT Rehabilitation Admissions Coordinator [phone]

## 2013-05-09 NOTE — Progress Notes (Addendum)
[facility] PROGRESS NOTE  [facility] ZOX:[id number] DOB: 14-Jul-1934 DOA: 05/05/2013 PCP: Donzetta Sprung, MD  Assessment/Plan:  new onset seizures Personable etiology. Much more oriented EEG is negative.  MRI shows PRES Continue seizure precautions.  B12, sedimentation rate, RPR normal. TSH is normal.  Neurology is following and appreciate input and recommendations- added acyclovir LP done- continue acyclovir- until HSV back  CIR   acute encephalopathy Likely secondary to problem #1. B12, RPR, TSH within normal limits. Neurology following and appreciate input and recommendations.  type 2 diabetes Sliding scale insulin -increase lantus -add novolog with meals  hypertension -d/c ACE to renal insuff -add BB  AKI -d/c lisinopril -add BB  CAD/Carotid artery disease Status post stent to the RCA in 2007. Status post carotid endarterectomy in 2012. Continue aspirin and statin.  Code Status: Full Family Communication: updated patient and family. Disposition Plan: CIR once mentally improved   Consultants: Neurology  Procedures:    Antibiotics:  acycolvir  HPI/Subjective: More oriented and less restless No SOB, no CP  Objective: Filed Vitals:   05/09/13 1010  BP: 116/74  Pulse:   Temp:   Resp:     Intake/Output Summary (Last 24 hours) at 05/09/13 1107 Last data filed at 05/09/13 0700  Gross per 24 hour  Intake      0 ml  Output    800 ml  Net   -800 ml   Filed Weights   05/05/13 2300  Weight: 80.2 kg (176 lb 12.9 oz)    Exam:   General:  NAD  Cardiovascular: RRR  Respiratory: CTAB  Abdomen: SOFT/nt/nd/+bs  Musculoskeletal: No c/c/e  Neuro: answers questions appropriately  Data Reviewed: Basic Metabolic Panel:  Recent Labs Lab 05/06/13 0557 05/07/13 0433 05/08/13 0530 05/09/13 0450  NA 141 141 139 140  K 4.1 3.7 3.9 4.3  CL 103 99 102 102  CO2 23 17* 19 20  GLUCOSE 155* 229* 216* 143*  BUN 13 12 18 17   CREATININE  1.18 1.29 1.50* 1.48*  CALCIUM 9.2 10.0 8.8 9.3  MG 1.6  --   --   --   PHOS 3.1  --   --   --    Liver Function Tests:  Recent Labs Lab 05/06/13 0557  AST 31  ALT 22  ALKPHOS 59  BILITOT 0.7  PROT 7.4  ALBUMIN 4.0   No results found for this basename: LIPASE, AMYLASE,  in the last 168 hours  Recent Labs Lab 05/06/13 0557  AMMONIA 13   CBC:  Recent Labs Lab 05/05/13 2058 05/06/13 0557 05/07/13 0433 05/08/13 0530 05/09/13 0450  WBC 8.1 6.9 12.2* 12.4* 11.1*  NEUTROABS 5.3  --   --   --   --   HGB 14.1 13.3 15.2 13.9 14.3  HCT 40.8 38.5* 43.8 41.0 41.6  MCV 84.1 83.3 83.3 83.7 84.6  PLT 130* 123* 148* 145* 142*   Cardiac Enzymes: No results found for this basename: CKTOTAL, CKMB, CKMBINDEX, TROPONINI,  in the last 168 hours BNP (last 3 results) No results found for this basename: PROBNP,  in the last 8760 hours CBG:  Recent Labs Lab 05/08/13 0657 05/08/13 1106 05/08/13 1634 05/08/13 2106 05/09/13 0630  GLUCAP 184* 168* 138* 103* 155*    Recent Results (from the past 240 hour(s))  CSF CULTURE     Status: None   Collection Time    05/08/13  2:00 PM      Result Value Range Status   Specimen Description CSF  Final   Special Requests Normal   Final   Gram Stain     Final   Value: CYTOSPIN WBC PRESENT, PREDOMINANTLY MONONUCLEAR     NO ORGANISMS SEEN     Performed [facility]     [facility] at [facility]   Culture     Final   Value: NO GROWTH 1 DAY     [facility]   Report Status PENDING   Incomplete  GRAM STAIN     Status: None   Collection Time    05/08/13  2:00 PM      Result Value Range Status   Specimen Description CSF   Final   Special Requests Normal   Final   Gram Stain     Final   Value: CYTOSPIN SLIDE:     WBC PRESENT, PREDOMINANTLY MONONUCLEAR     NO ORGANISMS SEEN   Report Status 05/08/2013 FINAL   Final  FUNGUS CULTURE W SMEAR     Status: None   Collection Time    05/08/13  2:00 PM       Result Value Range Status   Specimen Description CSF   Final   Special Requests Normal   Final   Fungal Smear     Final   Value: NO YEAST OR FUNGAL ELEMENTS SEEN     [facility]formed at [facility]   Culture     Final   Value: CULTURE IN PROGRESS FOR FOUR WEEKS     [facility]formed at [facility]   Report Status PENDING   Incomplete     Studies: Mr Brain Wo Contrast  05/09/2013   CLINICAL DATA:  New onset seizures, tremors, increased in severity over last few days.  EXAM: MRI HEAD WITHOUT CONTRAST  TECHNIQUE: Multiplanar, multiecho pulse sequences of the brain and surrounding structures were obtained without intravenous contrast.  COMPARISON:  Prior CT from 05/05/2013 as well as previous MRI from 01/05/2012.  FINDINGS: Study is somewhat degraded by motion artifact.  Diffuse prominence of the CSF containing spaces is compatible with generalized age-related atrophy. Scattered and confluent T2/FLAIR hyperintensity within the periventricular and deep white matter is most compatible with chronic small vessel ischemic changes.  There are curvilinear foci of hyperintense T2/FLAIR signal involving the cortical gray matter and subcortical white matter of the posterior or medial parietal lobes bilaterally (series 7, image 18). Findings are new as compared to the previous examination, and are suggestive of possible posterior reversible encephalopathy syndrome. No restricted diffusion seen within this region.  No foci of restricted diffusion are seen to suggest acute intracranial infarct. Normal flow voids seen within the intracranial vasculature. No intracranial hemorrhage seen.  No mass lesion or midline shift. Ventricles are stable in size without evidence of hydrocephalus. No extra-axial fluid collection.  The craniocervical junction is within normal limits. Pituitary gland is grossly normal. Globes and optic nerves demonstrate a normal appearance with normal signal intensity.  Moderate T2 hyperintensity  is seen within the bilateral maxillary sinuses. Probable retention cyst present within the right frontal sinus, also stable. There is no mastoid effusion.  Calvarium demonstrates a normal appearance with normal signal intensity. Visualized upper cervical spine is within normal limits.  IMPRESSION: 1. 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 2. No acute intracranial infarct or hemorrhage identified. 3. Advanced global atrophy with moderate chronic microvascular ischemic disease. 4. Paranasal sinus disease involving the right frontal and  bilateral maxillary sinuses, similar to prior.   Electronically Signed   By: Rise Mu M.D.   On: 05/09/2013 06:54   Dg Fluoro Guide Lumbar Puncture  05/08/2013   CLINICAL DATA:  Acute encephalopathy with seizure. Unsuccessful bedside lumbar puncture.  EXAM: DIAGNOSTIC LUMBAR PUNCTURE UNDER FLUOROSCOPIC GUIDANCE  FLUOROSCOPY TIME:  9 seconds.  PROCEDURE: Informed consent was obtained from the patient and his wife prior to the procedure, including potential complications of headache, allergy, and pain. With the patient prone, the lower back was prepped with Betadine. 1% Lidocaine was used for local anesthesia. Lumbar puncture was performed at the left L2-3 level using a 20 gauge needle with return of clear CSF with an opening pressure of 15 cm water measured prone through the needle. Approximately 8ml of CSF were obtained for laboratory studies. The patient tolerated the procedure well and there were no apparent complications.  IMPRESSION: Diagnostic lumbar puncture performed without immediate complications.   Electronically Signed   By: Roxy Horseman M.D.   On: 05/08/2013 14:24    Scheduled Meds: . acyclovir  660 mg Intravenous Q12H  . aspirin EC  325 mg Oral Daily  . atorvastatin  40 mg Oral q1800  . carvedilol  12.5 mg Oral BID WC  . insulin aspart   0-15 Units Subcutaneous TID WC  . insulin aspart  0-5 Units Subcutaneous QHS  . insulin aspart  4 Units Subcutaneous TID WC  . insulin glargine  15 Units Subcutaneous Daily  . multivitamin with minerals  1 tablet Oral Daily  . polyethylene glycol  17 g Oral Daily  . sodium chloride  3 mL Intravenous Q12H  . thiamine  100 mg Oral Daily   Continuous Infusions:   Principal Problem:   Acute encephalopathy Active Problems:   HYPERTENSION, UNSPECIFIED   CAD, NATIVE VESSEL   Seizure   DM type 2 (diabetes mellitus, type 2)    Time spent: 35 mins    Mary Secord DO [facility] Pager [phone]. If 7PM-7AM, please contact night-coverage at [email or web address], password [facility] 05/09/2013, 11:07 AM  LOS: 4 days

## 2013-05-09 NOTE — Progress Notes (Signed)
Subjective: Better night overnight. Improved recognition of family members. Per wife, he was hypertensive at home prior to being brought to the emergency room.   Contoinues to have marked BP fluctuations with standing.    Exam: Filed Vitals:   05/09/13 0610  BP: 119/69  Pulse: 127  Temp: 97.8 F (36.6 C)  Resp: 20   Gen: In bed, NAD MS: Awake, but not oriented. Recognizes wife but not daughter.  CN: PERRL, EOMI Motor: MAEW Sensory:responds to nox stim x 4.   Impression: 79 yo M with initially subacute and now acute worsening of confusion. His MRI is suggestive of PRES. He was hypertensive prior to being brought to the ER. He now has BP well controlled. PRES usually responds more quickly and his BP was never as high as I typically think of causing PRES, but especially if his BP fluctuates widely with standing, this may be an explanation for his continued AMS. I would want to see continued improvement prior to calling it this, however.   Elevated CSF protein is unfortunately very non-specific in diabetic patients.   Recommendations: 1) acyclovir pending hsv by PCR.  2) limit sedating medications unless patient is agitated.  3) continue BP control 4) will continue to follow.   Ritta SlotMcNeill Cinda Hara, MD [facility] [phone]  If 7pm- 7am, please page neurology on call at [phone].

## 2013-05-10 LAB — GLUCOSE, CAPILLARY
GLUCOSE-CAPILLARY: 177 mg/dL — AB (ref 70–99)
Glucose-Capillary: 160 mg/dL — ABNORMAL HIGH (ref 70–99)
Glucose-Capillary: 183 mg/dL — ABNORMAL HIGH (ref 70–99)
Glucose-Capillary: 171 mg/dL — ABNORMAL HIGH (ref 70–99)

## 2013-05-10 MED ORDER — RISPERIDONE 0.5 MG PO TABS
0.5000 mg | ORAL_TABLET | Freq: Every day | ORAL | Status: DC
  Administered 2013-05-10: 0.5 mg via ORAL
  Filled 2013-05-10 (×2): qty 1

## 2013-05-10 NOTE — Progress Notes (Signed)
Subjective: Continues to improve day to day, but in the evenings he still becomes confused.   No change with orthostatics.   Exam: Filed Vitals:   05/10/13 0522  BP: 142/76  Pulse: 88  Temp: 97.4 F (36.3 C)  Resp: 18   Gen: In bed, NAD MS: Awake, but not oriented. Recognizes wife but not daughter.  CN: PERRL, EOMI Motor: MAEW Sensory:responds to nox stim x 4.   Impression: 79 yo M with initially subacute and now acute worsening of confusion. His MRI is suggestive of PRES. He was hypertensive prior to being brought to the ER. He now has BP well controlled. PRES usually responds more quickly and his BP was never as high as I typically think of causing PRES, but he is improving and I feel that this is the most likely culprit.   Elevated CSF protein is unfortunately very non-specific in diabetic patients.   Recommendations: 1) acyclovir discontinued as HSV is negative.  2) limit sedating medications unless patient is agitated.  3) continue BP control 4) will continue to follow.   Ritta SlotMcNeill Dorthea Maina, MD [facility] [phone]  If 7pm- 7am, please page neurology on call at [phone].

## 2013-05-10 NOTE — Progress Notes (Signed)
[facility] PROGRESS NOTE  [facility] WGN:[id number] DOB: 01/06/1935 DOA: 05/05/2013 PCP: Donzetta SprungANIEL, TERRY, MD  Assessment/Plan:  new onset seizures Personable etiology. Much more oriented- still impulsive to get up- having a hard time following directions- will need sitter for now EEG is negative.  MRI shows PRES syndrome Continue seizure precautions.  B12, sedimentation rate, RPR normal. TSH is normal.  Neurology is following and appreciate input and recommendations- d/c acyclovir- as HSv negative CIR   acute encephalopathy Likely secondary to problem #1. B12, RPR, TSH within normal limits. Neurology following and appreciate input and recommendations.  type 2 diabetes Sliding scale insulin -increase lantus -add novolog with meals  hypertension -d/c ACE to renal insuff -add BB  AKI -d/c lisinopril -monitor with daily BMP  CAD/Carotid artery disease Status post stent to the RCA in 2007. Status post carotid endarterectomy in 2012. Continue aspirin and statin.  Code Status: Full Family Communication: updated patient and family. Disposition Plan: CIR once mentally improved   Consultants: Neurology  Procedures:    Antibiotics:  acyclovir d/c'd 1/24  HPI/Subjective: More oriented and less restless but still impulsive Not sleeping well at night due to restless leg  Objective: Filed Vitals:   05/10/13 0522  BP: 142/76  Pulse: 88  Temp: 97.4 F (36.3 C)  Resp: 18    Intake/Output Summary (Last 24 hours) at 05/10/13 0939 Last data filed at 05/10/13 0755  Gross per 24 hour  Intake    200 ml  Output      0 ml  Net    200 ml   Filed Weights   05/05/13 2300  Weight: 80.2 kg (176 lb 12.9 oz)    Exam:   General:  NAD  Cardiovascular: RRR  Respiratory: CTAB  Abdomen: SOFT/nt/nd/+bs  Musculoskeletal: No c/c/e  Neuro: answers questions appropriately most of the time - impulsive  Data Reviewed: Basic Metabolic Panel:  Recent Labs Lab  05/06/13 0557 05/07/13 0433 05/08/13 0530 05/09/13 0450  NA 141 141 139 140  K 4.1 3.7 3.9 4.3  CL 103 99 102 102  CO2 23 17* 19 20  GLUCOSE 155* 229* 216* 143*  BUN 13 12 18 17   CREATININE 1.18 1.29 1.50* 1.48*  CALCIUM 9.2 10.0 8.8 9.3  MG 1.6  --   --   --   PHOS 3.1  --   --   --    Liver Function Tests:  Recent Labs Lab 05/06/13 0557  AST 31  ALT 22  ALKPHOS 59  BILITOT 0.7  PROT 7.4  ALBUMIN 4.0   No results found for this basename: LIPASE, AMYLASE,  in the last 168 hours  Recent Labs Lab 05/06/13 0557  AMMONIA 13   CBC:  Recent Labs Lab 05/05/13 2058 05/06/13 0557 05/07/13 0433 05/08/13 0530 05/09/13 0450  WBC 8.1 6.9 12.2* 12.4* 11.1*  NEUTROABS 5.3  --   --   --   --   HGB 14.1 13.3 15.2 13.9 14.3  HCT 40.8 38.5* 43.8 41.0 41.6  MCV 84.1 83.3 83.3 83.7 84.6  PLT 130* 123* 148* 145* 142*   Cardiac Enzymes: No results found for this basename: CKTOTAL, CKMB, CKMBINDEX, TROPONINI,  in the last 168 hours BNP (last 3 results) No results found for this basename: PROBNP,  in the last 8760 hours CBG:  Recent Labs Lab 05/09/13 0630 05/09/13 1107 05/09/13 1630 05/09/13 2150 05/10/13 0643  GLUCAP 155* 203* 140* 131* 160*    Recent Results (from the past 240 hour(s))  CSF CULTURE     Status: None   Collection Time    05/08/13  2:00 PM      Result Value Range Status   Specimen Description CSF   Final   Special Requests Normal   Final   Gram Stain     Final   Value: CYTOSPIN WBC PRESENT, PREDOMINANTLY MONONUCLEAR     NO ORGANISMS SEEN     Performed at [facility]     Performed at [facility]   Culture     Final   Value: NO GROWTH 1 DAY     Performed at [facility]   Report Status PENDING   Incomplete  GRAM STAIN     Status: None   Collection Time    05/08/13  2:00 PM      Result Value Range Status   Specimen Description CSF   Final   Special Requests Normal   Final   Gram Stain     Final   Value: CYTOSPIN  SLIDE:     WBC PRESENT, PREDOMINANTLY MONONUCLEAR     NO ORGANISMS SEEN   Report Status 05/08/2013 FINAL   Final  FUNGUS CULTURE W SMEAR     Status: None   Collection Time    05/08/13  2:00 PM      Result Value Range Status   Specimen Description CSF   Final   Special Requests Normal   Final   Fungal Smear     Final   Value: NO YEAST OR FUNGAL ELEMENTS SEEN     Performed at [facility]   Culture     Final   Value: CULTURE IN PROGRESS FOR FOUR WEEKS     Performed at [facility]   Report Status PENDING   Incomplete     Studies: Mr Brain Wo Contrast  05/09/2013   CLINICAL DATA:  New onset seizures, tremors, increased in severity over last few days.  EXAM: MRI HEAD WITHOUT CONTRAST  TECHNIQUE: Multiplanar, multiecho pulse sequences of the brain and surrounding structures were obtained without intravenous contrast.  COMPARISON:  Prior CT from 05/05/2013 as well as previous MRI from 01/05/2012.  FINDINGS: Study is somewhat degraded by motion artifact.  Diffuse prominence of the CSF containing spaces is compatible with generalized age-related atrophy. Scattered and confluent T2/FLAIR hyperintensity within the periventricular and deep white matter is most compatible with chronic small vessel ischemic changes.  There are curvilinear foci of hyperintense T2/FLAIR signal involving the cortical gray matter and subcortical white matter of the posterior or medial parietal lobes bilaterally (series 7, image 18). Findings are new as compared to the previous examination, and are suggestive of possible posterior reversible encephalopathy syndrome. No restricted diffusion seen within this region.  No foci of restricted diffusion are seen to suggest acute intracranial infarct. Normal flow voids seen within the intracranial vasculature. No intracranial hemorrhage seen.  No mass lesion or midline shift. Ventricles are stable in size without evidence of hydrocephalus. No extra-axial fluid collection.   The craniocervical junction is within normal limits. Pituitary gland is grossly normal. Globes and optic nerves demonstrate a normal appearance with normal signal intensity.  Moderate T2 hyperintensity is seen within the bilateral maxillary sinuses. Probable retention cyst present within the right frontal sinus, also stable. There is no mastoid effusion.  Calvarium demonstrates a normal appearance with normal signal intensity. Visualized upper cervical spine is within normal limits.  IMPRESSION: 1. T2/FLAIR hyperintensity within the posteromedial parietal lobes bilaterally,  suggestive of possible posterior reversible encephalopathy syndrome (PRES). 2. No acute intracranial infarct or hemorrhage identified. 3. Advanced global atrophy with moderate chronic microvascular ischemic disease. 4. Paranasal sinus disease involving the right frontal and bilateral maxillary sinuses, similar to prior.   Electronically Signed   By: Rise Mu M.D.   On: 05/09/2013 06:54   Dg Fluoro Guide Lumbar Puncture  05/08/2013   CLINICAL DATA:  Acute encephalopathy with seizure. Unsuccessful bedside lumbar puncture.  EXAM: DIAGNOSTIC LUMBAR PUNCTURE UNDER FLUOROSCOPIC GUIDANCE  FLUOROSCOPY TIME:  9 seconds.  PROCEDURE: Informed consent was obtained from the patient and his wife prior to the procedure, including potential complications of headache, allergy, and pain. With the patient prone, the lower back was prepped with Betadine. 1% Lidocaine was used for local anesthesia. Lumbar puncture was performed at the left L2-3 level using a 20 gauge needle with return of clear CSF with an opening pressure of 15 cm water measured prone through the needle. Approximately 8ml of CSF were obtained for laboratory studies. The patient tolerated the procedure well and there were no apparent complications.  IMPRESSION: Diagnostic lumbar puncture performed without immediate complications.   Electronically Signed   By: Roxy Horseman M.D.   On:  05/08/2013 14:24    Scheduled Meds: . aspirin EC  325 mg Oral Daily  . atorvastatin  40 mg Oral q1800  . carvedilol  12.5 mg Oral BID WC  . insulin aspart  0-15 Units Subcutaneous TID WC  . insulin aspart  0-5 Units Subcutaneous QHS  . insulin aspart  4 Units Subcutaneous TID WC  . insulin glargine  15 Units Subcutaneous Daily  . multivitamin with minerals  1 tablet Oral Daily  . polyethylene glycol  17 g Oral Daily  . sodium chloride  3 mL Intravenous Q12H  . thiamine  100 mg Oral Daily   Continuous Infusions:   Principal Problem:   Acute encephalopathy Active Problems:   HYPERTENSION, UNSPECIFIED   CAD, NATIVE VESSEL   Seizure   DM type 2 (diabetes mellitus, type 2)    Time spent: 35 mins    Merdith Boyd DO [facility] Pager [phone]. If 7PM-7AM, please contact night-coverage at [email or web address], password [facility] 05/10/2013, 9:39 AM  LOS: 5 days

## 2013-05-11 DIAGNOSIS — E785 Hyperlipidemia, unspecified: Secondary | ICD-10-CM

## 2013-05-11 LAB — BASIC METABOLIC PANEL
BUN: 18 mg/dL (ref 6–23)
CO2: 24 meq/L (ref 19–32)
Calcium: 9 mg/dL (ref 8.4–10.5)
Chloride: 100 mEq/L (ref 96–112)
Creatinine, Ser: 1.35 mg/dL (ref 0.50–1.35)
GFR calc Af Amer: 57 mL/min — ABNORMAL LOW (ref >90)
GFR calc non Af Amer: 49 mL/min — ABNORMAL LOW (ref >90)
GLUCOSE: 186 mg/dL — AB (ref 70–99)
POTASSIUM: 4.2 meq/L (ref 3.7–5.3)
SODIUM: 139 meq/L (ref 137–147)

## 2013-05-11 LAB — CBC
HEMATOCRIT: 40.4 % (ref 39.0–52.0)
HEMOGLOBIN: 13.9 g/dL (ref 13.0–17.0)
MCH: 28.8 pg (ref 26.0–34.0)
MCHC: 34.4 g/dL (ref 30.0–36.0)
MCV: 83.8 fL (ref 78.0–100.0)
Platelets: 161 10*3/uL (ref 150–400)
RBC: 4.82 MIL/uL (ref 4.22–5.81)
RDW: 13.8 % (ref 11.5–15.5)
WBC: 9.1 10*3/uL (ref 4.0–10.5)

## 2013-05-11 LAB — CSF CULTURE
CULTURE: NO GROWTH
SPECIAL REQUESTS: NORMAL

## 2013-05-11 LAB — GLUCOSE, CAPILLARY
GLUCOSE-CAPILLARY: 169 mg/dL — AB (ref 70–99)
Glucose-Capillary: 187 mg/dL — ABNORMAL HIGH (ref 70–99)
Glucose-Capillary: 249 mg/dL — ABNORMAL HIGH (ref 70–99)
Glucose-Capillary: 166 mg/dL — ABNORMAL HIGH (ref 70–99)

## 2013-05-11 MED ORDER — ENOXAPARIN SODIUM 40 MG/0.4ML ~~LOC~~ SOLN
40.0000 mg | SUBCUTANEOUS | Status: DC
  Administered 2013-05-11 – 2013-05-14 (×4): 40 mg via SUBCUTANEOUS
  Filled 2013-05-11 (×4): qty 0.4

## 2013-05-11 MED ORDER — LORAZEPAM 2 MG/ML IJ SOLN
1.0000 mg | Freq: Once | INTRAMUSCULAR | Status: AC
  Administered 2013-05-11: 1 mg via INTRAVENOUS
  Filled 2013-05-11: qty 1

## 2013-05-11 MED ORDER — RISPERIDONE 1 MG PO TABS
1.0000 mg | ORAL_TABLET | Freq: Every day | ORAL | Status: DC
  Administered 2013-05-11 – 2013-05-13 (×3): 1 mg via ORAL
  Filled 2013-05-11 (×4): qty 1

## 2013-05-11 NOTE — Progress Notes (Signed)
Pt trying to remove his telemetry and trying to get out of bed. MD on-call Burnadette Peter(Lynch, NP) aware and orders made. Will continue to monitor pt.

## 2013-05-11 NOTE — Progress Notes (Signed)
[facility] PROGRESS NOTE  [facility] ZOX:[id number] DOB: 01/13/1935 DOA: 05/05/2013 PCP: Donzetta SprungANIEL, TERRY, MD  Assessment/Plan:  new onset seizures Much more oriented- sitter d/c'd EEG is negative.  MRI shows PRES syndrome- plan to repeat MRI early next week Continue seizure precautions.  B12, sedimentation rate, RPR normal. TSH is normal.  Neurology is following and appreciate input and recommendations- d/c acyclovir- as HSV negative CIR  acute encephalopathy Likely secondary to problem #1. B12, RPR, TSH within normal limits. Neurology following and appreciate input and recommendations. -increase Risperdal at night  type 2 diabetes Sliding scale insulin -increase lantus -add novolog with meals  hypertension -d/c ACE to renal insuff -add BB  AKI -d/c lisinopril -monitor with daily BMP  CAD/Carotid artery disease Status post stent to the RCA in 2007. Status post carotid endarterectomy in 2012. Continue aspirin and statin.  Code Status: Full Family Communication: updated patient and family. Disposition Plan: CIR once mentally improved   Consultants: Neurology  Procedures:    Antibiotics:  acyclovir d/c'd 1/24  HPI/Subjective: Not sleeping well at night  Objective: Filed Vitals:   05/11/13 0500  BP: 139/77  Pulse: 72  Temp: 97.8 F (36.6 C)  Resp: 20    Intake/Output Summary (Last 24 hours) at 05/11/13 1040 Last data filed at 05/10/13 2132  Gross per 24 hour  Intake    303 ml  Output      0 ml  Net    303 ml   Filed Weights   05/05/13 2300  Weight: 80.2 kg (176 lb 12.9 oz)    Exam:   General:  NAD  Cardiovascular: RRR  Respiratory: CTAB  Abdomen: SOFT/nt/nd/+bs  Musculoskeletal: No c/c/e  Neuro: cooperative, answer questions  Data Reviewed: Basic Metabolic Panel:  Recent Labs Lab 05/06/13 0557 05/07/13 0433 05/08/13 0530 05/09/13 0450 05/11/13 0448  NA 141 141 139 140 139  K 4.1 3.7 3.9 4.3 4.2  CL 103 99 102  102 100  CO2 23 17* 19 20 24   GLUCOSE 155* 229* 216* 143* 186*  BUN 13 12 18 17 18   CREATININE 1.18 1.29 1.50* 1.48* 1.35  CALCIUM 9.2 10.0 8.8 9.3 9.0  MG 1.6  --   --   --   --   PHOS 3.1  --   --   --   --    Liver Function Tests:  Recent Labs Lab 05/06/13 0557  AST 31  ALT 22  ALKPHOS 59  BILITOT 0.7  PROT 7.4  ALBUMIN 4.0   No results found for this basename: LIPASE, AMYLASE,  in the last 168 hours  Recent Labs Lab 05/06/13 0557  AMMONIA 13   CBC:  Recent Labs Lab 05/05/13 2058 05/06/13 0557 05/07/13 0433 05/08/13 0530 05/09/13 0450 05/11/13 0448  WBC 8.1 6.9 12.2* 12.4* 11.1* 9.1  NEUTROABS 5.3  --   --   --   --   --   HGB 14.1 13.3 15.2 13.9 14.3 13.9  HCT 40.8 38.5* 43.8 41.0 41.6 40.4  MCV 84.1 83.3 83.3 83.7 84.6 83.8  PLT 130* 123* 148* 145* 142* 161   Cardiac Enzymes: No results found for this basename: CKTOTAL, CKMB, CKMBINDEX, TROPONINI,  in the last 168 hours BNP (last 3 results) No results found for this basename: PROBNP,  in the last 8760 hours CBG:  Recent Labs Lab 05/10/13 0643 05/10/13 1149 05/10/13 1615 05/10/13 2114 05/11/13 0651  GLUCAP 160* 183* 177* 171* 169*    Recent Results (from the past 240  hour(s))  CSF CULTURE     Status: None   Collection Time    05/08/13  2:00 PM      Result Value Range Status   Specimen Description CSF   Final   Special Requests Normal   Final   Gram Stain     Final   Value: CYTOSPIN WBC PRESENT, PREDOMINANTLY MONONUCLEAR     NO ORGANISMS SEEN     Performed at [facility]     Performed at [facility]   Culture     Final   Value: NO GROWTH 2 DAYS     Performed at [facility]   Report Status PENDING   Incomplete  GRAM STAIN     Status: None   Collection Time    05/08/13  2:00 PM      Result Value Range Status   Specimen Description CSF   Final   Special Requests Normal   Final   Gram Stain     Final   Value: CYTOSPIN SLIDE:     WBC PRESENT, PREDOMINANTLY  MONONUCLEAR     NO ORGANISMS SEEN   Report Status 05/08/2013 FINAL   Final  FUNGUS CULTURE W SMEAR     Status: None   Collection Time    05/08/13  2:00 PM      Result Value Range Status   Specimen Description CSF   Final   Special Requests Normal   Final   Fungal Smear     Final   Value: NO YEAST OR FUNGAL ELEMENTS SEEN     Performed at [facility]   Culture     Final   Value: CULTURE IN PROGRESS FOR FOUR WEEKS     Performed at [facility]   Report Status PENDING   Incomplete     Studies: No results found.  Scheduled Meds: . aspirin EC  325 mg Oral Daily  . atorvastatin  40 mg Oral q1800  . carvedilol  12.5 mg Oral BID WC  . insulin aspart  0-15 Units Subcutaneous TID WC  . insulin aspart  0-5 Units Subcutaneous QHS  . insulin aspart  4 Units Subcutaneous TID WC  . insulin glargine  15 Units Subcutaneous Daily  . multivitamin with minerals  1 tablet Oral Daily  . polyethylene glycol  17 g Oral Daily  . risperiDONE  1 mg Oral Q2000  . sodium chloride  3 mL Intravenous Q12H  . thiamine  100 mg Oral Daily   Continuous Infusions:   Principal Problem:   Acute encephalopathy Active Problems:   HYPERTENSION, UNSPECIFIED   CAD, NATIVE VESSEL   Seizure   DM type 2 (diabetes mellitus, type 2)    Time spent: 35 mins    Keiaira Donlan DO [facility] Pager [phone]. If 7PM-7AM, please contact night-coverage at [email or web address], password [facility] 05/11/2013, 10:40 AM  LOS: 6 days

## 2013-05-11 NOTE — Progress Notes (Signed)
Subjective: Some agitation overnight.   Exam: Filed Vitals:   05/11/13 0500  BP: 139/77  Pulse: 72  Temp: 97.8 F (36.6 C)  Resp: 20   Gen: In bed, NAD MS: Awake, but not oriented. Recognizes wife but not daughter.  CN: PERRL, EOMI Motor: MAEW Sensory:responds to nox stim x 4.   Impression: 79 yo M with initially subacute and now acute worsening of confusion. His MRI is suggestive of PRES. He was hypertensive prior to being brought to the ER. He now has BP well controlled. PRES usually responds more quickly and his BP was never as high as I typically think of causing PRES, but he is improving and I feel that this is the most likely culprit.   Elevated CSF protein is unfortunately very non-specific in diabetic patients.   Recommendations: 1) acyclovir discontinued as HSV is negative.  2) could increase qhs Risperdal.  3) will continue to follow.  4) plan repeat MRI early next week.   Ritta SlotMcNeill Garison Genova, MD [facility] [phone]  If 7pm- 7am, please page neurology on call at [phone].

## 2013-05-12 DIAGNOSIS — G9349 Other encephalopathy: Secondary | ICD-10-CM

## 2013-05-12 LAB — GLUCOSE, CAPILLARY
GLUCOSE-CAPILLARY: 172 mg/dL — AB (ref 70–99)
GLUCOSE-CAPILLARY: 183 mg/dL — AB (ref 70–99)
GLUCOSE-CAPILLARY: 170 mg/dL — AB (ref 70–99)
Glucose-Capillary: 200 mg/dL — ABNORMAL HIGH (ref 70–99)

## 2013-05-12 MED ORDER — LORAZEPAM 1 MG PO TABS
1.0000 mg | ORAL_TABLET | Freq: Once | ORAL | Status: AC
  Administered 2013-05-12: 1 mg via ORAL
  Filled 2013-05-12: qty 1

## 2013-05-12 MED ORDER — VALPROATE SODIUM 500 MG/5ML IV SOLN
250.0000 mg | Freq: Two times a day (BID) | INTRAVENOUS | Status: DC
  Administered 2013-05-12 – 2013-05-13 (×3): 250 mg via INTRAVENOUS
  Filled 2013-05-12 (×4): qty 2.5

## 2013-05-12 NOTE — Progress Notes (Signed)
Occupational Therapy Treatment Patient Details Name: [name] MRN: [id number] DOB: 03/18/1935 Today's Date: 05/12/2013 Time: 4098-1191 OT Time Calculation (min): 23 min  OT Assessment / Plan / Recommendation  History of present illness  79 y.o. male admitted with confusion, weakness, unsteady gait and seizure activity. Pt with hx of HTN, DM2, CAD, back pain, incontinence, visual hallunications and s/p back surg. Pt with recent falls. family  taking the patient to see his neurosurgeon when he had a grand mal seizure activity enroute. Came to the emergency department at [facility]. And, when he was there he had an episode when he got combative and then stared straight ahead not responding to anybody. At that time there was no grand mal activity. Family also mentioned that patient has been very tremulous and these tremors have increased over the last 6 months. Decision was made for the patient to be transferred to [facility].    OT comments  Pt.has  dcreased ability to follow more than one direction at a time. Pt. Requires  Extra time to  Process information. Pt. Is able to perform ADLs and transfers at S level  With one step instruction and extra time. Pt. Would benefit from further OT.  Follow Up Recommendations       Barriers to Discharge       Equipment Recommendations       Recommendations for Other Services    Frequency     Progress towards OT Goals Progress towards OT goals: Progressing toward goals  Plan      Precautions / Restrictions Precautions Precautions: Fall Restrictions Weight Bearing Restrictions: No   Pertinent Vitals/Pain No c/o    ADL  Grooming: Performed;Wash/dry hands;Wash/dry face;Teeth care;Supervision/safety Where Assessed - Grooming: Unsupported standing Toilet Transfer: Min Pension scheme manager Method: Surveyor, minerals: Grab bars;Comfort height toilet ADL Comments:  (Pt. requires tasks to be stated  one at a  time.)    OT Diagnosis:    OT Problem List:   OT Treatment Interventions:     OT Goals(current goals can now be found in the care plan section)    Visit Information  Last OT Received On: 05/12/13 Assistance Needed: +1 History of Present Illness:  79 y.o. male admitted with confusion, weakness, unsteady gait and seizure activity. Pt with hx of HTN, DM2, CAD, back pain, incontinence, visual hallunications and s/p back surg. Pt with recent falls. family  taking the patient to see his neurosurgeon when he had a grand mal seizure activity enroute. Came to the emergency department at [facility]. And, when he was there he had an episode when he got combative and then stared straight ahead not responding to anybody. At that time there was no grand mal activity. Family also mentioned that patient has been very tremulous and these tremors have increased over the last 6 months. Decision was made for the patient to be transferred to [facility].     Subjective Data      Prior Functioning       Cognition  Cognition Arousal/Alertness: Awake/alert Behavior During Therapy: [facility] for tasks assessed/performed Overall Cognitive Status: Impaired/Different from baseline Area of Impairment: Attention;Memory;Following commands;Safety/judgement;Awareness;Problem solving Following Commands: Follows one step commands consistently Safety/Judgement: Decreased awareness of safety    Mobility       Exercises      Balance    End of Session OT - End of Session Activity Tolerance: Patient tolerated treatment well Patient left: in chair;with call bell/phone within reach;with  chair alarm set;with family/visitor present  GO     Michaela Broski 05/12/2013, 1:14 PM

## 2013-05-12 NOTE — Progress Notes (Signed)
Physical Therapy Treatment Patient Details Name: [name] MRN: [id number] DOB: 01-28-1935 Today's Date: 05/12/2013 Time: 4098-1191 PT Time Calculation (min): 27 min  PT Assessment / Plan / Recommendation  History of Present Illness  79 y.o. male admitted with confusion, weakness, unsteady gait and seizure activity. Pt with hx of HTN, DM2, CAD, back pain, incontinence, visual hallunications and s/p back surg. Pt with recent falls. family  taking the patient to see his neurosurgeon when he had a grand mal seizure activity enroute. Came to the emergency department at [facility]. And, when he was there he had an episode when he got combative and then stared straight ahead not responding to anybody. At that time there was no grand mal activity. Family also mentioned that patient has been very tremulous and these tremors have increased over the last 6 months. Decision was made for the patient to be transferred to [facility].    PT Comments   Patient demonstrates continued progress with mobility. Ambulating increased distances today.  Higher level balance deficits still noted, specifically when performing ambulation in conjunction with basic cognitive tasks such as navigating to various areas on the floor, reading, recalling, and counting objects on walls.  Feel patient will benefit from breif CIR stay to ensure safety with mobility upon discharge.  Will continue to see as indicated and progress activity as tolerated.   Follow Up Recommendations  CIR           Equipment Recommendations  Other (comment) (TBD)    Recommendations for Other Services Rehab consult  Frequency Min 3X/week   Progress towards PT Goals Progress towards PT goals: Progressing toward goals  Plan Current plan remains appropriate    Precautions / Restrictions Precautions Precautions: Fall Restrictions Weight Bearing Restrictions: No   Pertinent Vitals/Pain No pain at this time, VSS    Mobility   Bed Mobility General bed mobility comments: not assessed Transfers Overall transfer level: Needs assistance Equipment used: None Transfers: Sit to/from Stand Sit to Stand: Supervision Ambulation/Gait Ambulation/Gait assistance: Min assist Ambulation Distance (Feet): 640 Feet Assistive device: None Gait Pattern/deviations: Step-through pattern;Drifts right/left Gait velocity: decreased Gait velocity interpretation: Below normal speed for age/gender General Gait Details: Pt ambulation improving, able to stretch longer distances today.  Patient still with difficulty maintaining balance when ambulating in conjunction with conigitive tasks such as navigation, counting, and conversation.     Exercises  Cognitive recall activities during mobility General there-ex; bilaterally: Long arc quads, Ankle pumps, Marching     PT Goals (current goals can now be found in the care plan section) Acute Rehab PT Goals Patient Stated Goal: none stated PT Goal Formulation: With patient Time For Goal Achievement: 05/20/13 Potential to Achieve Goals: Good  Visit Information  Last PT Received On: 05/12/13 Assistance Needed: +1 History of Present Illness:  79 y.o. male admitted with confusion, weakness, unsteady gait and seizure activity. Pt with hx of HTN, DM2, CAD, back pain, incontinence, visual hallunications and s/p back surg. Pt with recent falls. family  taking the patient to see his neurosurgeon when he had a grand mal seizure activity enroute. Came to the emergency department at [facility]. And, when he was there he had an episode when he got combative and then stared straight ahead not responding to anybody. At that time there was no grand mal activity. Family also mentioned that patient has been very tremulous and these tremors have increased over the last 6 months. [facility]     Subjective Data  Subjective: I feel pretty  good Patient Stated Goal: none stated   Cognition  Cognition Arousal/Alertness: Awake/alert Behavior During Therapy: WFL for tasks assessed/performed Overall Cognitive Status: Impaired/Different from baseline Area of Impairment: Attention;Memory;Following commands;Safety/judgement;Awareness;Problem solving Current Attention Level: Selective Memory: Decreased recall of precautions;Decreased short-term memory Following Commands: Follows one step commands consistently Safety/Judgement: Decreased awareness of safety Awareness: Emergent;Anticipatory General Comments: Cognition continues to improve, patietn able to perform some cognitive recall tasks with success during activity    Balance  Balance Sitting balance-Leahy Scale: Good Standing balance-Leahy Scale: Fair High level balance activites: Side stepping;Backward walking;Direction changes;Turns;Sudden stops;Head turns High Level Balance Comments: min assist for higher level balance activities  End of Session PT - End of Session Equipment Utilized During Treatment: Gait belt Activity Tolerance: Patient tolerated treatment well Patient left: in chair;with call bell/phone within reach;with chair alarm set Nurse Communication: Mobility status   GP     Fabio AsaWerner, Maayan Jenning J 05/12/2013, 8:53 AM Charlotte Crumbevon Sallie Staron, PT DPT  [phone]

## 2013-05-12 NOTE — Progress Notes (Signed)
Subjective: Some agitation overnight.   Exam: Filed Vitals:   05/12/13 1313  BP: 151/82  Pulse: 80  Temp: 97.6 F (36.4 C)  Resp:    Gen: In bed, NAD MS: Awake, but not oriented. Recognizes wife but not daughter.  CN: PERRL, EOMI Motor: MAEW Sensory:responds to nox stim x 4.   Impression: 79 yo M with initially subacute and now acute worsening of confusion. His MRI is suggestive of PRES. He was hypertensive prior to being brought to the ER. He now has BP well controlled. PRES usually responds more quickly and his BP was never as high as I typically think of causing PRES, but he is improving and I feel that this is the most likely culprit.   Elevated CSF protein is unfortunately very non-specific in diabetic patients.   Recommendations: 1)  qhs Risperdal.  2) will try depakote 250mg  BID 3) will continue to follow  Ritta SlotMcNeill Mckynleigh Mussell, MD [facility] [phone]  If 7pm- 7am, please page neurology on call at [phone].

## 2013-05-12 NOTE — Progress Notes (Signed)
UR complete.  Domnique Vanegas RN, MSN 

## 2013-05-12 NOTE — Progress Notes (Signed)
[facility] PROGRESS NOTE  [facility] HYQ:[id number] DOB: [birth year] DOA: 05/05/2013 PCP: Donzetta Sprung, MD  Assessment/Plan:  new onset seizures Much more oriented- sitter d/c'd EEG is negative.  MRI shows PRES syndrome- plan to repeat MRI on Tues Continue seizure precautions.  B12, sedimentation rate, RPR normal. TSH is normal.  Neurology is following and appreciate input and recommendations- d/c acyclovir- as HSV negative CIR  acute encephalopathy Likely secondary to problem #1. B12, RPR, TSH within normal limits. Neurology following and appreciate input and recommendations. -increase Risperdal at night but still not sleeping  type 2 diabetes Sliding scale insulin -increase lantus -add novolog with meals  hypertension -d/c ACE to renal insuff -add BB  AKI -d/c lisinopril -monitor with daily BMP  CAD/Carotid artery disease Status post stent to the RCA in 2007. Status post carotid endarterectomy in 2012. Continue aspirin and statin.  Code Status: Full Family Communication: updated patient and family. Disposition Plan: CIR after mri   Consultants: Neurology  Procedures:    Antibiotics:  acyclovir d/c'd 1/24  HPI/Subjective: Not sleeping well at night  Objective: Filed Vitals:   05/12/13 1014  BP: 117/75  Pulse: 73  Temp: 97.6 F (36.4 C)  Resp: 20    Intake/Output Summary (Last 24 hours) at 05/12/13 1308 Last data filed at 05/11/13 2217  Gross per 24 hour  Intake    603 ml  Output      2 ml  Net    601 ml   Filed Weights   05/05/13 2300  Weight: 80.2 kg (176 lb 12.9 oz)    Exam:   General:  NAD  Cardiovascular: RRR  Respiratory: CTAB  Abdomen: SOFT/nt/nd/+bs  Musculoskeletal: No c/c/e  Neuro: cooperative, answer questions  Data Reviewed: Basic Metabolic Panel:  Recent Labs Lab 05/06/13 0557 05/07/13 0433 05/08/13 0530 05/09/13 0450 05/11/13 0448  NA 141 141 139 140 139  K 4.1 3.7 3.9 4.3 4.2  CL 103 99  102 102 100  CO2 23 17* 19 20 24   GLUCOSE 155* 229* 216* 143* 186*  BUN 13 12 18 17 18   CREATININE 1.18 1.29 1.50* 1.48* 1.35  CALCIUM 9.2 10.0 8.8 9.3 9.0  MG 1.6  --   --   --   --   PHOS 3.1  --   --   --   --    Liver Function Tests:  Recent Labs Lab 05/06/13 0557  AST 31  ALT 22  ALKPHOS 59  BILITOT 0.7  PROT 7.4  ALBUMIN 4.0   No results found for this basename: LIPASE, AMYLASE,  in the last 168 hours  Recent Labs Lab 05/06/13 0557  AMMONIA 13   CBC:  Recent Labs Lab 05/05/13 2058 05/06/13 0557 05/07/13 0433 05/08/13 0530 05/09/13 0450 05/11/13 0448  WBC 8.1 6.9 12.2* 12.4* 11.1* 9.1  NEUTROABS 5.3  --   --   --   --   --   HGB 14.1 13.3 15.2 13.9 14.3 13.9  HCT 40.8 38.5* 43.8 41.0 41.6 40.4  MCV 84.1 83.3 83.3 83.7 84.6 83.8  PLT 130* 123* 148* 145* 142* 161   Cardiac Enzymes: No results found for this basename: CKTOTAL, CKMB, CKMBINDEX, TROPONINI,  in the last 168 hours BNP (last 3 results) No results found for this basename: PROBNP,  in the last 8760 hours CBG:  Recent Labs Lab 05/11/13 0651 05/11/13 1149 05/11/13 1649 05/11/13 2200 05/12/13 0648  GLUCAP 169* 187* 249* 166* 172*    Recent Results (from the  past 240 hour(s))  CSF CULTURE     Status: None   Collection Time    05/08/13  2:00 PM      Result Value Range Status   Specimen Description CSF   Final   Special Requests Normal   Final   Gram Stain     Final   Value: CYTOSPIN WBC PRESENT, PREDOMINANTLY MONONUCLEAR     NO ORGANISMS SEEN     [facility]     [facility]   Culture     Final   Value: NO GROWTH 3 DAYS     Performed at [facility]   Report Status 05/11/2013 FINAL   Final  GRAM STAIN     Status: None   Collection Time    05/08/13  2:00 PM      Result Value Range Status   Specimen Description CSF   Final   Special Requests Normal   Final   Gram Stain     Final   Value: CYTOSPIN SLIDE:     WBC PRESENT,  PREDOMINANTLY MONONUCLEAR     NO ORGANISMS SEEN   Report Status 05/08/2013 FINAL   Final  FUNGUS CULTURE W SMEAR     Status: None   Collection Time    05/08/13  2:00 PM      Result Value Range Status   Specimen Description CSF   Final   Special Requests Normal   Final   Fungal Smear     Final   Value: NO YEAST OR FUNGAL ELEMENTS SEEN     Performed at [facility]   Culture     Final   Value: CULTURE IN PROGRESS FOR FOUR WEEKS     Performed at [facility]   Report Status PENDING   Incomplete     Studies: No results found.  Scheduled Meds: . aspirin EC  325 mg Oral Daily  . atorvastatin  40 mg Oral q1800  . carvedilol  12.5 mg Oral BID WC  . enoxaparin (LOVENOX) injection  40 mg Subcutaneous Q24H  . insulin aspart  0-15 Units Subcutaneous TID WC  . insulin aspart  0-5 Units Subcutaneous QHS  . insulin aspart  4 Units Subcutaneous TID WC  . insulin glargine  15 Units Subcutaneous Daily  . multivitamin with minerals  1 tablet Oral Daily  . polyethylene glycol  17 g Oral Daily  . risperiDONE  1 mg Oral Q2000  . sodium chloride  3 mL Intravenous Q12H  . thiamine  100 mg Oral Daily   Continuous Infusions:   Principal Problem:   Acute encephalopathy Active Problems:   HYPERTENSION, UNSPECIFIED   CAD, NATIVE VESSEL   Seizure   DM type 2 (diabetes mellitus, type 2)    Time spent: 35 mins    VANN, JESSICA DO [facility] Pager [phone]. If 7PM-7AM, please contact night-coverage at [email or web address], password [email or web address] 05/12/2013, 1:08 PM  LOS: 7 days

## 2013-05-13 LAB — CBC
HEMATOCRIT: 38.7 % — AB (ref 39.0–52.0)
Hemoglobin: 13.5 g/dL (ref 13.0–17.0)
MCH: 29.2 pg (ref 26.0–34.0)
MCHC: 34.9 g/dL (ref 30.0–36.0)
MCV: 83.6 fL (ref 78.0–100.0)
Platelets: 168 10*3/uL (ref 150–400)
RBC: 4.63 MIL/uL (ref 4.22–5.81)
RDW: 13.7 % (ref 11.5–15.5)
WBC: 7.5 10*3/uL (ref 4.0–10.5)

## 2013-05-13 LAB — COMPREHENSIVE METABOLIC PANEL
ALBUMIN: 3.8 g/dL (ref 3.5–5.2)
ALK PHOS: 74 U/L (ref 39–117)
ALT: 39 U/L (ref 0–53)
AST: 30 U/L (ref 0–37)
BUN: 19 mg/dL (ref 6–23)
CO2: 22 mEq/L (ref 19–32)
CREATININE: 1.25 mg/dL (ref 0.50–1.35)
Calcium: 9.1 mg/dL (ref 8.4–10.5)
Chloride: 102 mEq/L (ref 96–112)
GFR calc Af Amer: 63 mL/min — ABNORMAL LOW (ref >90)
GFR calc non Af Amer: 54 mL/min — ABNORMAL LOW (ref >90)
Glucose, Bld: 168 mg/dL — ABNORMAL HIGH (ref 70–99)
POTASSIUM: 3.9 meq/L (ref 3.7–5.3)
Sodium: 141 mEq/L (ref 137–147)
Total Bilirubin: 0.4 mg/dL (ref 0.3–1.2)
Total Protein: 7.3 g/dL (ref 6.0–8.3)

## 2013-05-13 LAB — GLUCOSE, CAPILLARY
Glucose-Capillary: 168 mg/dL — ABNORMAL HIGH (ref 70–99)
Glucose-Capillary: 218 mg/dL — ABNORMAL HIGH (ref 70–99)
Glucose-Capillary: 143 mg/dL — ABNORMAL HIGH (ref 70–99)

## 2013-05-13 MED ORDER — INSULIN ASPART 100 UNIT/ML ~~LOC~~ SOLN
6.0000 [IU] | Freq: Three times a day (TID) | SUBCUTANEOUS | Status: DC
  Administered 2013-05-13 – 2013-05-14 (×4): 6 [IU] via SUBCUTANEOUS

## 2013-05-13 MED ORDER — DIVALPROEX SODIUM 250 MG PO DR TAB
250.0000 mg | DELAYED_RELEASE_TABLET | Freq: Two times a day (BID) | ORAL | Status: DC
  Administered 2013-05-13 – 2013-05-14 (×2): 250 mg via ORAL
  Filled 2013-05-13 (×3): qty 1

## 2013-05-13 MED ORDER — INSULIN GLARGINE 100 UNIT/ML ~~LOC~~ SOLN
18.0000 [IU] | Freq: Every day | SUBCUTANEOUS | Status: DC
  Administered 2013-05-14: 18 [IU] via SUBCUTANEOUS
  Filled 2013-05-13: qty 0.18

## 2013-05-13 NOTE — Progress Notes (Addendum)
Subjective: Slept more overnight, possibly slightly more confused today.   Exam: Filed Vitals:   05/13/13 0913  BP: 145/81  Pulse: 107  Temp: 97.9 F (36.6 C)  Resp: 18   Gen: In bed, NAD MS: Awake, but not oriented. Recognizes wife but not daughter.  CN: PERRL, EOMI Motor: MAEW Sensory:responds to nox stim x 4.   Impression: 79 yo M with initially subacute and now acute worsening of confusion. His MRI is suggestive of PRES. He was hypertensive prior to being brought to the ER. He now has BP well controlled. PRES usually responds more quickly and his BP was never as high as I typically think of causing PRES, but he is improving and I feel that this is the most likely culprit.   Elevated CSF protein is unfortunately very non-specific in diabetic patients.   Recommendations: 1)  qhs Risperdal.  2) continue depakote 250mg  BID 3) repeat MRI brain w/wo contrast. If stable or improved, then would not pursue further workup and would treat for delirium.   Ritta SlotMcNeill Larence Thone, MD [facility] [phone]  If 7pm- 7am, please page neurology on call at [phone].

## 2013-05-13 NOTE — Progress Notes (Signed)
Physical Therapy Treatment Patient Details Name: [name] MRN: [id number] DOB: [birth year] Today's Date: 05/13/2013 Time: 7829-5621 PT Time Calculation (min): 27 min  PT Assessment / Plan / Recommendation  History of Present Illness  79 y.o. male admitted with confusion, weakness, unsteady gait and seizure activity. Pt with hx of HTN, DM2, CAD, back pain, incontinence, visual hallunications and s/p back surg. Pt with recent falls. family  taking the patient to see his neurosurgeon when he had a grand mal seizure activity enroute. Came to the emergency department at [facility]. And, when he was there he had an episode when he got combative and then stared straight ahead not responding to anybody. At that time there was no grand mal activity. Family also mentioned that patient has been very tremulous and these tremors have increased over the last 6 months. Decision was made for the patient to be transferred to [facility].    PT Comments   Patient demonstrates significant improvements in functional mobility and cognition today. Patient able to perform several high level balance activities in conjunction with higher level cognition activities and no balance deficits noted. Patient also able to recall information presented to him during yesterday's session correctly. Patient has improved significantly and this therapists feels patient will be safe for dc home with no PT follow up at this time. Recommending Supervision initially from family.    Follow Up Recommendations  No PT follow up;Supervision/Assistance - 24 hour           Equipment Recommendations  None recommended by PT       Frequency Min 3X/week   Progress towards PT Goals Progress towards PT goals: Progressing toward goals  Plan Discharge plan needs to be updated    Precautions / Restrictions Precautions Precautions: Fall Precaution Comments: monitor BP and HR   Pertinent Vitals/Pain No pain at this time, HR  stable    Mobility  Transfers Overall transfer level: Needs assistance Equipment used: None Sit to Stand: Supervision;Min guard Ambulation/Gait Ambulation/Gait assistance: Supervision Ambulation Distance (Feet): 1640 Feet Assistive device: None Gait Pattern/deviations: WFL(Within Functional Limits) Gait velocity: WFL Gait velocity interpretation: at or above normal speed for age/gender General Gait Details: Patient ambulating well, multi tasking without difficulty today. Patient able to ambulate spell first and last name backwards, navigate around the unit, perform counting tasks and multiplication tasks during mobility with no change in gait. Patient also able to perform multiple balance tests without balance deficits.      PT Goals (current goals can now be found in the care plan section) Acute Rehab PT Goals PT Goal Formulation: With patient Time For Goal Achievement: 05/20/13 Potential to Achieve Goals: Good  Visit Information  Last PT Received On: 05/13/13 Assistance Needed: +1 History of Present Illness:  79 y.o. male admitted with confusion, weakness, unsteady gait and seizure activity. Pt with hx of HTN, DM2, CAD, back pain, incontinence, visual hallunications and s/p back surg. Pt with recent falls. family  taking the patient to see his neurosurgeon when he had a grand mal seizure activity enroute. Came to the emergency department at [facility]. And, when he was there he had an episode when he got combative and then stared straight ahead not responding to anybody. At that time there was no grand mal activity. Family also mentioned that patient has been very tremulous and these tremors have increased over the last 6 months. Decision was made for the patient to be transferred to [facility].  Subjective Data  Subjective: "i feel the same way i felt when i first got my cateracts cleared, it's like i didnt realize how bad i was, but now i feel great"    Cognition  Cognition Arousal/Alertness: Awake/alert Behavior During Therapy: WFL for tasks assessed/performed Overall Cognitive Status: Within Functional Limits for tasks assessed General Comments: significant improvements in cognition, able to perform high level tasks without difficulty inculding spelling name backwards, counting by 3's while ambulating, able to recall names and phrases from yesterday.    Balance  Balance High level balance activites: Side stepping;Braiding;Backward walking;Direction changes;Turns;Sudden stops;Head turns High Level Balance Comments: supervision Standardized Balance Assessment Standardized Balance Assessment : Berg Balance Test;Dynamic Gait Index Berg Balance Test Sit to Stand: Able to stand without using hands and stabilize independently Standing Unsupported: Able to stand safely 2 minutes Sitting with Back Unsupported but Feet Supported on Floor or Stool: Able to sit safely and securely 2 minutes Stand to Sit: Sits safely with minimal use of hands Transfers: Able to transfer safely, minor use of hands Standing Unsupported with Eyes Closed: Able to stand 10 seconds safely Standing Ubsupported with Feet Together: Able to place feet together independently and stand 1 minute safely Turn 360 Degrees: Able to turn 360 degrees safely in 4 seconds or less Dynamic Gait Index Level Surface: Normal Change in Gait Speed: Normal Gait with Horizontal Head Turns: Normal Gait with Vertical Head Turns: Normal Gait and Pivot Turn: Normal Step Over Obstacle: Normal Step Around Obstacles: Normal Steps: Mild Impairment Total Score: 23  End of Session PT - End of Session Equipment Utilized During Treatment: Gait belt Activity Tolerance: Patient tolerated treatment well Patient left: in chair;with call bell/phone within reach Nurse Communication: Mobility status (discussed ok with no chair alarm while family present)   GP     Fabio AsaWerner, Miner Koral J 05/13/2013, 4:15  PM Charlotte Crumbevon Caldwell Kronenberger, PT DPT  [phone]

## 2013-05-13 NOTE — Progress Notes (Signed)
[facility] PROGRESS NOTE  [name] WUJ:[id number] DOB: March 28, 1935 DOA: 05/05/2013 PCP: Donzetta Sprung, MD  [name] is a 79 y.o. male has a past medical history of hypertension, diabetes mellitus, type II, coronary artery disease, carotid artery disease, back pain, status post back surgery, who is unable to provide much history due to his confusion. Most of the history was provided by his wife and 2 daughters and, hence history is limited. It appears that for the past few weeks, but it could be longer, patient has been having staggering gait/unsteady gait. He's had a few falls. And then since Friday it appears the patient had not been feeling well. He has been confused. He had difficulty doing simple activities such as wearing his pants, which he was able to do before. He's had a few episodes of incontinence of urine. His wife had struggled to get him to walk. There's been no fever. No chills. No sick contacts. No nausea, vomiting. He did have a headache earlier today but none currently. Apparently he's had some hallucinations as well, which has been visual. The only medication change that was made recently was change from atenolol to Norvasc last week. Otherwise, the family denies any changes to his other medications. No changes to his Clonazepam dosing. He's not on narcotics on a chronic basis and has not stopped taking any medications. Currently no history of any alcohol use. Thery spoke to the patient's primary care physician, and they were taking the patient to see his neurosurgeon when he had a grand mal seizure activity enroute. Came to the emergency department at [facility]. And, when he was there he had an episode when he got combative and then stared straight ahead not responding to anybody. At that time there was no grand mal activity. Family also mentioned that patient has been very tremulous and these tremors have increased over the last 6 months. Decision was made for  the patient to be transferred to [facility]  Assessment/Plan:  new onset seizures Much more oriented- sitter d/c'd EEG is negative.  MRI shows PRES syndrome- plan to repeat MRI on 1/27 Continue seizure precautions.  B12, sedimentation rate, RPR normal. TSH is normal.  Neurology is following and appreciate input and recommendations- d/c acyclovir- as HSV negative CIR  acute encephalopathy Likely secondary to problem #1. B12, RPR, TSH within normal limits. Neurology following and appreciate input and recommendations. -increase Risperdal at night but still not sleeping Neuro added depakote  type 2 diabetes Sliding scale insulin -increase lantus -add novolog with meals  hypertension -d/c ACE to renal insuff -add BB BP comtrolled  AKI -d/c lisinopril -monitor with daily BMP  CAD/Carotid artery disease Status post stent to the RCA in 2007. Status post carotid endarterectomy in 2012. Continue aspirin and statin.  Code Status: Full Family Communication: updated patient and family. Disposition Plan: CIR soon   Consultants: Neurology  Procedures:  LP   Antibiotics:  acyclovir d/c'd 1/24  HPI/Subjective: Not sleeping well at night still Not hallucinating as much per family but confabulating  Objective: Filed Vitals:   05/13/13 0913  BP: 145/81  Pulse: 107  Temp: 97.9 F (36.6 C)  Resp: 18    Intake/Output Summary (Last 24 hours) at 05/13/13 1235 Last data filed at 05/12/13 2336  Gross per 24 hour  Intake    123 ml  Output      0 ml  Net    123 ml   Filed Weights   05/05/13 2300  Weight:  80.2 kg (176 lb 12.9 oz)    Exam:   General:  NAD  Cardiovascular: RRR  Respiratory: CTAB  Abdomen: SOFT/nt/nd/+bs  Musculoskeletal: No c/c/e  Neuro: cooperative, answer questions  Data Reviewed: Basic Metabolic Panel:  Recent Labs Lab 05/07/13 0433 05/08/13 0530 05/09/13 0450 05/11/13 0448 05/13/13 0641  NA 141 139 140 139 141  K 3.7  3.9 4.3 4.2 3.9  CL 99 102 102 100 102  CO2 17* 19 20 24 22   GLUCOSE 229* 216* 143* 186* 168*  BUN 12 18 17 18 19   CREATININE 1.29 1.50* 1.48* 1.35 1.25  CALCIUM 10.0 8.8 9.3 9.0 9.1   Liver Function Tests:  Recent Labs Lab 05/13/13 0641  AST 30  ALT 39  ALKPHOS 74  BILITOT 0.4  PROT 7.3  ALBUMIN 3.8   No results found for this basename: LIPASE, AMYLASE,  in the last 168 hours No results found for this basename: AMMONIA,  in the last 168 hours CBC:  Recent Labs Lab 05/07/13 0433 05/08/13 0530 05/09/13 0450 05/11/13 0448 05/13/13 0641  WBC 12.2* 12.4* 11.1* 9.1 7.5  HGB 15.2 13.9 14.3 13.9 13.5  HCT 43.8 41.0 41.6 40.4 38.7*  MCV 83.3 83.7 84.6 83.8 83.6  PLT 148* 145* 142* 161 168   Cardiac Enzymes: No results found for this basename: CKTOTAL, CKMB, CKMBINDEX, TROPONINI,  in the last 168 hours BNP (last 3 results) No results found for this basename: PROBNP,  in the last 8760 hours CBG:  Recent Labs Lab 05/12/13 1216 05/12/13 1753 05/12/13 2109 05/13/13 0656 05/13/13 1107  GLUCAP 183* 200* 170* 168* 218*    Recent Results (from the past 240 hour(s))  CSF CULTURE     Status: None   Collection Time    05/08/13  2:00 PM      Result Value Range Status   Specimen Description CSF   Final   Special Requests Normal   Final   Gram Stain     Final   Value: CYTOSPIN WBC PRESENT, PREDOMINANTLY MONONUCLEAR     NO ORGANISMS SEEN     Performed at [facility]     Performed at [facility]   Culture     Final   Value: NO GROWTH 3 DAYS     Performed at [facility]   Report Status 05/11/2013 FINAL   Final  GRAM STAIN     Status: None   Collection Time    05/08/13  2:00 PM      Result Value Range Status   Specimen Description CSF   Final   Special Requests Normal   Final   Gram Stain     Final   Value: CYTOSPIN SLIDE:     WBC PRESENT, PREDOMINANTLY MONONUCLEAR     NO ORGANISMS SEEN   Report Status 05/08/2013 FINAL   Final  FUNGUS  CULTURE W SMEAR     Status: None   Collection Time    05/08/13  2:00 PM      Result Value Range Status   Specimen Description CSF   Final   Special Requests Normal   Final   Fungal Smear     Final   Value: NO YEAST OR FUNGAL ELEMENTS SEEN     Performed at [facility]   Culture     Final   Value: CULTURE IN PROGRESS FOR FOUR WEEKS     Performed at [facility]   Report Status PENDING   Incomplete  Studies: No results found.  Scheduled Meds: . aspirin EC  325 mg Oral Daily  . atorvastatin  40 mg Oral q1800  . carvedilol  12.5 mg Oral BID WC  . enoxaparin (LOVENOX) injection  40 mg Subcutaneous Q24H  . insulin aspart  0-15 Units Subcutaneous TID WC  . insulin aspart  0-5 Units Subcutaneous QHS  . insulin aspart  6 Units Subcutaneous TID WC  . [START ON 05/14/2013] insulin glargine  18 Units Subcutaneous Daily  . multivitamin with minerals  1 tablet Oral Daily  . polyethylene glycol  17 g Oral Daily  . risperiDONE  1 mg Oral Q2000  . sodium chloride  3 mL Intravenous Q12H  . thiamine  100 mg Oral Daily  . valproate sodium  250 mg Intravenous Q12H   Continuous Infusions:   Principal Problem:   Acute encephalopathy Active Problems:   HYPERTENSION, UNSPECIFIED   CAD, NATIVE VESSEL   Seizure   DM type 2 (diabetes mellitus, type 2)    Time spent: 35 mins    Fatiha Guzy DO [facility] Pager [phone]. If 7PM-7AM, please contact night-coverage at [email or web address], password [facility] 05/13/2013, 12:35 PM  LOS: 8 days

## 2013-05-13 NOTE — Progress Notes (Signed)
Rehab admissions - Patient making good progress.  Amb 640 ft with min assist.  Needed minguard assist for toilet transfers.  I spoke with [employer] yesterday, but no decision yet regarding possible inpatient rehab admission.  Call me for questions.  #[phone]

## 2013-05-13 NOTE — Progress Notes (Signed)
Occupational Therapy Treatment Patient Details Name: Era BumpersCleveland Coatney MRN: [id number] DOB: 03/20/1935 Today's Date: 05/13/2013 Time: [id number] OT Time Calculation (min): 13 min  OT Assessment / Plan / Recommendation  History of present illness  79 y.o. male admitted with confusion, weakness, unsteady gait and seizure activity. Pt with hx of HTN, DM2, CAD, back pain, incontinence, visual hallunications and s/p back surg. Pt with recent falls. family  taking the patient to see his neurosurgeon when he had a grand mal seizure activity enroute. [facility]. And, when he was there he had an episode when he got combative and then stared straight ahead not responding to anybody. At that time there was no grand mal activity. Family also mentioned that patient has been very tremulous and these tremors have increased over the last 6 months. Decision was made for the patient to be [facility].    OT comments  Up in chair upon arrival.  Agreeable to completion of self care tasks.  S/min guard a for amb. To/from b.room and all aspects of care while in b.room.  Doing great.  Follow Up Recommendations  CIR;Supervision/Assistance - 24 hour                  Recommendations for Other Services Rehab consult  Frequency Min 2X/week   Progress towards OT Goals Progress towards OT goals: Progressing toward goals  Plan Discharge plan remains appropriate    Precautions / Restrictions Precautions Precautions: Fall Precaution Comments: monitor BP and HR   Pertinent Vitals/Pain 0/10 per pt.    ADL  Grooming: Performed;Wash/dry hands;Supervision/safety Where Assessed - Grooming: Unsupported standing Lower Body Dressing: Performed;Set up Where Assessed - Lower Body Dressing: Supported sitting Toilet Transfer: Performed;Min guard StatisticianToilet Transfer Method: Surveyor, mineralstand pivot Toilet Transfer Equipment: Grab bars;Comfort height toilet Toileting - Designer, fashion/clothingClothing  Manipulation and Hygiene: Supervision/safety;Min guard Where Assessed - Engineer, miningToileting Clothing Manipulation and Hygiene: Standing Transfers/Ambulation Related to ADLs: pt. amb. in room with intermittent furniture walking, no lob noted ADL Comments: stood for toileting and clothing managment with s/min guard a, all grooming also in standing     OT Goals(current goals can now be found in the care plan section)    Visit Information  Last OT Received On: 05/13/13 History of Present Illness:  79 y.o. male admitted with confusion, weakness, unsteady gait and seizure activity. Pt with hx of HTN, DM2, CAD, back pain, incontinence, visual hallunications and s/p back surg. Pt with recent falls. family  taking the patient to see his neurosurgeon when he had a grand mal seizure activity enroute. [facility]. And, when he was there he had an episode when he got combative and then stared straight ahead not responding to anybody. At that time there was no grand mal activity. Family also mentioned that patient has been very tremulous and these tremors have increased over the last 6 months. Decision was made for the [facility] to be [facility].                  Cognition  Cognition Arousal/Alertness: Awake/alert Behavior During Therapy: [facility] for tasks assessed/performed Overall Cognitive Status: Impaired/Different from baseline General Comments: great improvement from previous doc., answering questions appropriately, no delay noted with inst. cues and completion of tasks    Mobility  Transfers Overall transfer level: Needs assistance Equipment used: None Sit to Stand: Supervision;Min guard  End of Session OT - End of Session Activity Tolerance: Patient tolerated treatment well Patient left: in chair;with call bell/phone within reach;with chair alarm set       Robet Leu, COTA/L 05/13/2013, 1:55 PM

## 2013-05-14 ENCOUNTER — Inpatient Hospital Stay (HOSPITAL_COMMUNITY): Payer: Medicare Other

## 2013-05-14 ENCOUNTER — Encounter (HOSPITAL_COMMUNITY): Payer: Self-pay | Admitting: *Deleted

## 2013-05-14 LAB — GLUCOSE, CAPILLARY
GLUCOSE-CAPILLARY: 153 mg/dL — AB (ref 70–99)
Glucose-Capillary: 165 mg/dL — ABNORMAL HIGH (ref 70–99)
Glucose-Capillary: 147 mg/dL — ABNORMAL HIGH (ref 70–99)

## 2013-05-14 MED ORDER — INSULIN GLARGINE 100 UNIT/ML SOLOSTAR PEN: 18.0000 [IU] | PEN_INJECTOR | Freq: Every day | SUBCUTANEOUS | Status: DC

## 2013-05-14 MED ORDER — ASPIRIN 325 MG PO TBEC: 325.0000 mg | DELAYED_RELEASE_TABLET | Freq: Every day | ORAL | Status: DC

## 2013-05-14 MED ORDER — GADOBENATE DIMEGLUMINE 529 MG/ML IV SOLN
15.0000 mL | Freq: Once | INTRAVENOUS | Status: AC
  Administered 2013-05-14: 15 mL via INTRAVENOUS

## 2013-05-14 MED ORDER — CARVEDILOL 12.5 MG PO TABS: 12.5000 mg | ORAL_TABLET | Freq: Two times a day (BID) | ORAL | Status: DC

## 2013-05-14 MED ORDER — INSULIN ASPART 100 UNIT/ML FLEXPEN: 5.0000 [IU] | PEN_INJECTOR | Freq: Three times a day (TID) | SUBCUTANEOUS | Status: DC

## 2013-05-14 MED ORDER — DIVALPROEX SODIUM 250 MG PO DR TAB: 250.0000 mg | DELAYED_RELEASE_TABLET | Freq: Two times a day (BID) | ORAL | Status: DC

## 2013-05-14 NOTE — Discharge Summary (Addendum)
Physician Discharge Summary  [facility] ZOX:[id number] DOB: [birth year] DOA: 05/05/2013  PCP: Donzetta Sprung, MD  Admit date: 05/05/2013 Discharge date: 05/14/2013  Time spent: 35 minutes  Recommendations for Outpatient Follow-up:  1. CMP 2 weeks 2. Monitor Blood sugar at home 3. Wean off mood stabilizing meds as patient tolerates 4. Follow up with neuro 2-3 weeks  Discharge Diagnoses:  Principal Problem:   Acute encephalopathy Active Problems:   HYPERTENSION, UNSPECIFIED   CAD, NATIVE VESSEL   Seizure   DM type 2 (diabetes mellitus, type 2)   Discharge Condition: improved  Diet recommendation: diabetic  Filed Weights   05/05/13 2300  Weight: 80.2 kg (176 lb 12.9 oz)    History of present illness:  *[name] is a 79 y.o. male has a past medical history of hypertension, diabetes mellitus, type II, coronary artery disease, carotid artery disease, back pain, status post back surgery, who is unable to provide much history due to his confusion. Most of the history was provided by his wife and 2 daughters and, hence history is limited. It appears that for the past few weeks, but it could be longer, patient has been having staggering gait/unsteady gait. He's had a few falls. And then since Friday it appears the patient had not been feeling well. He has been confused. He had difficulty doing simple activities such as wearing his pants, which he was able to do before. He's had a few episodes of incontinence of urine. His wife had struggled to get him to walk. There's been no fever. No chills. No sick contacts. No nausea, vomiting. He did have a headache earlier today but none currently. Apparently he's had some hallucinations as well, which has been visual. The only medication change that was made recently was change from atenolol to Norvasc last week. Otherwise, the family denies any changes to his other medications. No changes to his Clonazepam dosing. He's not on narcotics on a  chronic basis and has not stopped taking any medications. Currently no history of any alcohol use. Thery spoke to the patient's primary care physician, and they were taking the patient to see his neurosurgeon when he had a grand mal seizure activity enroute. Came to the emergency department at [facility]. And, when he was there he had an episode when he got combative and then stared straight ahead not responding to anybody. At that time there was no grand mal activity. Family also mentioned that patient has been very tremulous and these tremors have increased over the last 6 months. Decision was made for the patient to be transferred to [facility] Course:  new onset seizures  Much more oriented- sitter d/c'd  EEG is negative.  MRI shows PRES syndrome- repeat MRI shows resolution Continue seizure precautions.  B12, sedimentation rate, RPR normal. TSH is normal.  Neurology is following and appreciate input and recommendations- d/c acyclovir- as HSV negative  No further PT needs  acute encephalopathy  Likely secondary to problem #1. B12, RPR, TSH within normal limits. Neurology following and appreciate input and recommendations.  Neuro added depakote- will need monitoring outpatient   type 2 diabetes  -increase lantus  -family given lessons on how to give meds -add novolog with meals   hypertension  -d/c ACE to renal insuff  -add BB  BP controlled   AKI  -d/c lisinopril  -monitor with daily BMP   CAD/Carotid artery disease  Status post stent to the RCA in 2007. Status post carotid endarterectomy  in 2012. Continue aspirin and statin   Procedures:    Consultations:  neuro  Discharge Exam: Filed Vitals:   05/14/13 1400  BP: 132/50  Pulse: 78  Temp: 97.5 F (36.4 C)  Resp: 20    General: pleasant- much more oriented walking around unit Cardiovascular: rrr Respiratory: clear anterior  Discharge Instructions      Discharge Orders    Future Appointments Provider Department Dept Phone   08/29/2013 10:00 AM [facility]  [facility] [address] [phone]   08/29/2013 11:00 AM Carma Lair Nickel, NP [facility] [phone]   Future Orders Complete By Expires   Diet - low sodium heart healthy  As directed    Diet Carb Modified  As directed    Discharge instructions  As directed    Comments:     CMP, cbc 2 weeks   Increase activity slowly  As directed        Medication List    STOP taking these medications       amLODipine 10 MG tablet  Commonly known as:  NORVASC     aspirin 81 MG tablet  Replaced by:  aspirin 325 MG EC tablet     clonazePAM 1 MG tablet  Commonly known as:  KLONOPIN     gabapentin 300 MG capsule  Commonly known as:  NEURONTIN     GLIPIZIDE XL 5 MG 24 hr tablet  Generic drug:  glipiZIDE     lisinopril-hydrochlorothiazide 20-12.5 MG per tablet  Commonly known as:  PRINZIDE,ZESTORETIC     metFORMIN 500 MG (MOD) 24 hr tablet  Commonly known as:  GLUMETZA     omeprazole 20 MG capsule  Commonly known as:  PRILOSEC      TAKE these medications       aspirin 325 MG EC tablet  Take 1 tablet (325 mg total) by mouth daily.     carvedilol 12.5 MG tablet  Commonly known as:  COREG  Take 1 tablet (12.5 mg total) by mouth 2 (two) times daily with a meal.     divalproex 250 MG DR tablet  Commonly known as:  DEPAKOTE  Take 1 tablet (250 mg total) by mouth every 12 (twelve) hours.     insulin aspart 100 UNIT/ML FlexPen  Commonly known as:  NOVOLOG FLEXPEN  Inject 5 Units into the skin 3 (three) times daily with meals.     Insulin Glargine 100 UNIT/ML Solostar Pen  Commonly known as:  LANTUS  Inject 18 Units into the skin daily at 10 pm.     simvastatin 80 MG tablet  Commonly known as:  ZOCOR  Take 80 mg by mouth at bedtime.     traZODone 50 MG tablet  Commonly known as:  DESYREL  Take 50 mg by mouth at bedtime.       Allergies   Allergen Reactions  . Omnipaque [Iohexol]   . Codeine Nausea And Vomiting   Follow-up Information   Follow up with Donzetta Sprung, MD In 1 week.   Specialty:  Family Medicine   Contact information:   [address] [place] [address] [phone]        The results of significant diagnostics from this hospitalization (including imaging, microbiology, ancillary and laboratory) are listed below for reference.    Significant Diagnostic Studies: Mr Brain Wo Contrast  05/09/2013   CLINICAL DATA:  New onset seizures, tremors, increased in severity over last few days.  EXAM: MRI HEAD WITHOUT  CONTRAST  TECHNIQUE: Multiplanar, multiecho pulse sequences of the brain and surrounding structures were obtained without intravenous contrast.  COMPARISON:  Prior CT from 05/05/2013 as well as previous MRI from 01/05/2012.  FINDINGS: Study is somewhat degraded by motion artifact.  Diffuse prominence of the CSF containing spaces is compatible with generalized age-related atrophy. Scattered and confluent T2/FLAIR hyperintensity within the periventricular and deep white matter is most compatible with chronic small vessel ischemic changes.  There are curvilinear foci of hyperintense T2/FLAIR signal involving the cortical gray matter and subcortical white matter of the posterior or medial parietal lobes bilaterally (series 7, image 18). Findings are new as compared to the previous examination, and are suggestive of possible posterior reversible encephalopathy syndrome. No restricted diffusion seen within this region.  No foci of restricted diffusion are seen to suggest acute intracranial infarct. Normal flow voids seen within the intracranial vasculature. No intracranial hemorrhage seen.  No mass lesion or midline shift. Ventricles are stable in size without evidence of hydrocephalus. No extra-axial fluid collection.  The craniocervical junction is within normal limits. Pituitary gland is grossly normal. Globes and  optic nerves demonstrate a normal appearance with normal signal intensity.  Moderate T2 hyperintensity is seen within the bilateral maxillary sinuses. Probable retention cyst present within the right frontal sinus, also stable. There is no mastoid effusion.  Calvarium demonstrates a normal appearance with normal signal intensity. Visualized upper cervical spine is within normal limits.  IMPRESSION: 1. 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 2. No acute intracranial infarct or hemorrhage identified. 3. Advanced global atrophy with moderate chronic microvascular ischemic disease. 4. Paranasal sinus disease involving the right frontal and bilateral maxillary sinuses, similar to prior.   Electronically Signed   By: Rise MuBenjamin  McClintock M.D.   On: 05/09/2013 06:54   Dg Chest Port 1 View  05/05/2013   CLINICAL DATA:  Seizure.  EXAM: PORTABLE CHEST - 1 VIEW  COMPARISON:  Plain film of the chest 05/05/2013 at 11:55 a.m. and PA and lateral chest 01/03/2011.  FINDINGS: The lungs are clear. Heart size is normal. No pneumothorax or pleural effusion.  IMPRESSION: No acute disease.   Electronically Signed   By: Drusilla Kannerhomas  Dalessio M.D.   On: 05/05/2013 20:13   Dg Fluoro Guide Lumbar Puncture  05/08/2013   CLINICAL DATA:  Acute encephalopathy with seizure. Unsuccessful bedside lumbar puncture.  EXAM: DIAGNOSTIC LUMBAR PUNCTURE UNDER FLUOROSCOPIC GUIDANCE  FLUOROSCOPY TIME:  9 seconds.  PROCEDURE: Informed consent was obtained from the patient and his wife prior to the procedure, including potential complications of headache, allergy, and pain. With the patient prone, the lower back was prepped with Betadine. 1% Lidocaine was used for local anesthesia. Lumbar puncture was performed at the left L2-3 level using a 20 gauge needle with return of clear CSF with an opening pressure of 15 cm water measured prone through the  needle. Approximately 8ml of CSF were obtained for laboratory studies. The patient tolerated the procedure well and there were no apparent complications.  IMPRESSION: Diagnostic lumbar puncture performed without immediate complications.   Electronically Signed   By: Roxy HorsemanBill  Veazey M.D.   On: 05/08/2013 14:24    Microbiology: Recent Results (from the past 240 hour(s))  CSF CULTURE     Status: None   Collection Time    05/08/13  2:00 PM      Result Value Range Status   Specimen Description CSF   Final   Special Requests  Normal   Final   Gram Stain     Final   Value: CYTOSPIN WBC PRESENT, PREDOMINANTLY MONONUCLEAR     NO ORGANISMS SEEN     Performed at [facility]     Performed at [facility]   Culture     Final   Value: NO GROWTH 3 DAYS     Performed at [facility]   Report Status 05/11/2013 FINAL   Final  GRAM STAIN     Status: None   Collection Time    05/08/13  2:00 PM      Result Value Range Status   Specimen Description CSF   Final   Special Requests Normal   Final   Gram Stain     Final   Value: CYTOSPIN SLIDE:     WBC PRESENT, PREDOMINANTLY MONONUCLEAR     NO ORGANISMS SEEN   Report Status 05/08/2013 FINAL   Final  FUNGUS CULTURE W SMEAR     Status: None   Collection Time    05/08/13  2:00 PM      Result Value Range Status   Specimen Description CSF   Final   Special Requests Normal   Final   Fungal Smear     Final   Value: NO YEAST OR FUNGAL ELEMENTS SEEN     Performed at [facility]   Culture     Final   Value: CULTURE IN PROGRESS FOR FOUR WEEKS     Performed at [facility]   Report Status PENDING   Incomplete     Labs: Basic Metabolic Panel:  Recent Labs Lab 05/08/13 0530 05/09/13 0450 05/11/13 0448 05/13/13 0641  NA 139 140 139 141  K 3.9 4.3 4.2 3.9  CL 102 102 100 102  CO2 19 20 24 22   GLUCOSE 216* 143* 186* 168*  BUN 18 17 18 19   CREATININE 1.50* 1.48* 1.35 1.25  CALCIUM 8.8 9.3 9.0 9.1   Liver  Function Tests:  Recent Labs Lab 05/13/13 0641  AST 30  ALT 39  ALKPHOS 74  BILITOT 0.4  PROT 7.3  ALBUMIN 3.8   No results found for this basename: LIPASE, AMYLASE,  in the last 168 hours No results found for this basename: AMMONIA,  in the last 168 hours CBC:  Recent Labs Lab 05/08/13 0530 05/09/13 0450 05/11/13 0448 05/13/13 0641  WBC 12.4* 11.1* 9.1 7.5  HGB 13.9 14.3 13.9 13.5  HCT 41.0 41.6 40.4 38.7*  MCV 83.7 84.6 83.8 83.6  PLT 145* 142* 161 168   Cardiac Enzymes: No results found for this basename: CKTOTAL, CKMB, CKMBINDEX, TROPONINI,  in the last 168 hours BNP: BNP (last 3 results) No results found for this basename: PROBNP,  in the last 8760 hours CBG:  Recent Labs Lab 05/13/13 1107 05/13/13 1614 05/13/13 2124 05/14/13 0651 05/14/13 1146  GLUCAP 218* 153* 143* 165* 147*       Signed:  Elynore Dolinski  [facility] 05/14/2013, 6:25 PM

## 2013-05-14 NOTE — Progress Notes (Signed)
Pt discharge home with all pt belongings accompanied by daughter Octavio MannsDiane Dilello and grand daughter. D/c follow up  instruction and self insulin administration teaching re-enforced and both patient and daughter verbalized good understanding.

## 2013-05-14 NOTE — Progress Notes (Signed)
Subjective:  Alert. Fairly oriented this morning; although, the patient is somewhat unsure of himself.  Exam: Filed Vitals:   05/14/13 0654  BP: 146/78  Pulse: 86  Temp: 98 F (36.7 C)  Resp: 20   Physical exam General - pleasant 79 year old male drinking coffee in no acute distress Heart - Regular rate and rhythm - no murmer noted Lungs - Clear to auscultation Skin - Warm and dry  NEUROLOGIC:  MENTAL STATUS: awake, alert, oriented when given time to respond, language fluent,  follows all commands.  CRANIAL NERVES: pupils equal and reactive to light,extraocular muscles intact, facial sensation and strength symmetric, uvula midlinec, tongue midline MOTOR: normal bulk and tone, Strength - 5/5 throughout SENSORY: normal and symmetric to light touch  COORDINATION: finger-nose-finger with mild tremor bilaterally.  EEG - The one sharp transient is noted during this tracing. This is within the range of what can be considered as normal. Clinical correlation is recommended.  Lumbar puncture - CSF culture no growth x3 days. Fundus culture pending. Glucose 112 (H)  Total protein 68 (H)   Impression: [age] yo M with initially subacute and now acute worsening of confusion. His MRI is suggestive of PRES. He was hypertensive prior to being brought to the ER. He now has BP well controlled. PRES usually responds more quickly and his BP was never as high as I typically think of causing PRES, but he is improving and I feel that this is the most likely culprit. Elevated CSF protein is unfortunately very non-specific in diabetic patients.   Recommendations: 1) Continue qhs Risperdal.  2) Continue depakote 250mg  BID 3) Repeat MRI brain w/wo contrast is currently pending. 4) The patient's confusion is improving.  5) Blood pressure is better. 6)  Physical therapist recommends no followup PT. Occupational therapist felt inpatient rehabilitation should be considered. Rehabilitation          M.D. consult  obtained. Patient felt to be a candidate for inpatient rehabilitation.  Delton Seeavid Rinehuls PA-C [facility] Pager [phone] 05/14/2013, 8:26 AM  Patient seen and examined together with physician assistant and I concur with the assessment and plan.  Wyatt Portelasvaldo Angeni Chaudhuri, MD

## 2013-05-14 NOTE — Progress Notes (Signed)
Pt came back form MRI result read to MD and Pt discharge home with family. Condition at discharge is able

## 2013-05-16 LAB — GLUCOSE, CAPILLARY: Glucose-Capillary: 120 mg/dL — ABNORMAL HIGH (ref 70–99)

## 2013-06-05 LAB — FUNGUS CULTURE W SMEAR
FUNGAL SMEAR: NONE SEEN
SPECIAL REQUESTS: NORMAL

## 2013-08-29 ENCOUNTER — Other Ambulatory Visit (HOSPITAL_COMMUNITY): Payer: MEDICARE

## 2013-08-29 ENCOUNTER — Ambulatory Visit: Payer: MEDICARE | Admitting: Family

## 2015-03-30 ENCOUNTER — Telehealth: Payer: Self-pay | Admitting: Internal Medicine

## 2015-03-30 ENCOUNTER — Inpatient Hospital Stay (HOSPITAL_COMMUNITY)
Admission: AD | Admit: 2015-03-30 | Discharge: 2015-04-01 | DRG: 072 | Disposition: A | Discharge status: Home Health | Payer: Medicare Other | Source: Other Acute Inpatient Hospital | Attending: Internal Medicine | Admitting: Internal Medicine

## 2015-03-30 DIAGNOSIS — E119 Type 2 diabetes mellitus without complications: Secondary | ICD-10-CM | POA: Diagnosis present

## 2015-03-30 DIAGNOSIS — R32 Unspecified urinary incontinence: Secondary | ICD-10-CM | POA: Diagnosis present

## 2015-03-30 DIAGNOSIS — Z7982 Long term (current) use of aspirin: Secondary | ICD-10-CM | POA: Diagnosis not present

## 2015-03-30 DIAGNOSIS — I2510 Atherosclerotic heart disease of native coronary artery without angina pectoris: Secondary | ICD-10-CM | POA: Diagnosis present

## 2015-03-30 DIAGNOSIS — D72829 Elevated white blood cell count, unspecified: Secondary | ICD-10-CM | POA: Diagnosis present

## 2015-03-30 DIAGNOSIS — F419 Anxiety disorder, unspecified: Secondary | ICD-10-CM | POA: Diagnosis present

## 2015-03-30 DIAGNOSIS — E785 Hyperlipidemia, unspecified: Secondary | ICD-10-CM | POA: Diagnosis present

## 2015-03-30 DIAGNOSIS — K219 Gastro-esophageal reflux disease without esophagitis: Secondary | ICD-10-CM | POA: Diagnosis present

## 2015-03-30 DIAGNOSIS — R4182 Altered mental status, unspecified: Secondary | ICD-10-CM | POA: Diagnosis not present

## 2015-03-30 DIAGNOSIS — I129 Hypertensive chronic kidney disease with stage 1 through stage 4 chronic kidney disease, or unspecified chronic kidney disease: Secondary | ICD-10-CM | POA: Diagnosis present

## 2015-03-30 DIAGNOSIS — Z794 Long term (current) use of insulin: Secondary | ICD-10-CM | POA: Diagnosis not present

## 2015-03-30 DIAGNOSIS — G9340 Encephalopathy, unspecified: Secondary | ICD-10-CM | POA: Diagnosis present

## 2015-03-30 DIAGNOSIS — I6529 Occlusion and stenosis of unspecified carotid artery: Secondary | ICD-10-CM | POA: Diagnosis present

## 2015-03-30 DIAGNOSIS — I10 Essential (primary) hypertension: Secondary | ICD-10-CM | POA: Diagnosis present

## 2015-03-30 DIAGNOSIS — Z955 Presence of coronary angioplasty implant and graft: Secondary | ICD-10-CM

## 2015-03-30 DIAGNOSIS — Z79899 Other long term (current) drug therapy: Secondary | ICD-10-CM

## 2015-03-30 DIAGNOSIS — R569 Unspecified convulsions: Secondary | ICD-10-CM | POA: Diagnosis present

## 2015-03-30 DIAGNOSIS — Z87891 Personal history of nicotine dependence: Secondary | ICD-10-CM

## 2015-03-30 DIAGNOSIS — N183 Chronic kidney disease, stage 3 unspecified: Secondary | ICD-10-CM | POA: Diagnosis present

## 2015-03-30 HISTORY — DX: Anxiety disorder, unspecified: F41.9

## 2015-03-30 HISTORY — DX: Chronic kidney disease, stage 3 unspecified: N18.30

## 2015-03-30 HISTORY — DX: Chronic kidney disease, stage 3 (moderate): N18.3

## 2015-03-30 LAB — GLUCOSE, CAPILLARY: Glucose-Capillary: 163 mg/dL — ABNORMAL HIGH (ref 65–99)

## 2015-03-30 NOTE — Telephone Encounter (Signed)
[facility] Togiakhristy (attending Dr. Wendi SnipesGuda)  80 year old man with past medical history of hypertension, hyperlipidemia, diabetes mellitus, CAD, S/P stent placement, carotid artery stenosis, who presents with altered mental status and bilateral leg weakness. Patient started having confusion and bilateral leg weakness this afternoon. Left leg is worse than the right. Patient does not have fever, chills, chest pain, shortness of breath, abdominal pain, diarrhea, symptoms of UTI. Patient had similar episode 2 years ago with negative workup per EDP.   In ED, patient was found to have negative CT head and negative MRI of brain for acute abnormalities. WBC 13.9, hemoglobin 12.2, platelet 180, negative urinalysis, creatinine 1.38 which is close to baseline, blood pressure 132/59, heart rate 87, respiration rate 20, oxygen saturation 95 on room air, temperature normal. Patient's accepted to tele bed. Neurology was consulted, Dr. Roseanne RenoStewart will see patient.  Lorretta HarpXilin Lennox Leikam, MD  [facility] Pager [phone]  If 7PM-7AM, please contact night-coverage [email or web address] Password [email or web address] 03/30/2015, 8:05 PM

## 2015-03-31 ENCOUNTER — Inpatient Hospital Stay (HOSPITAL_COMMUNITY): Payer: Medicare Other

## 2015-03-31 ENCOUNTER — Encounter (HOSPITAL_COMMUNITY): Payer: Self-pay | Admitting: Internal Medicine

## 2015-03-31 DIAGNOSIS — N183 Chronic kidney disease, stage 3 unspecified: Secondary | ICD-10-CM | POA: Diagnosis present

## 2015-03-31 DIAGNOSIS — G9340 Encephalopathy, unspecified: Principal | ICD-10-CM

## 2015-03-31 DIAGNOSIS — R4182 Altered mental status, unspecified: Secondary | ICD-10-CM

## 2015-03-31 DIAGNOSIS — R569 Unspecified convulsions: Secondary | ICD-10-CM

## 2015-03-31 DIAGNOSIS — E785 Hyperlipidemia, unspecified: Secondary | ICD-10-CM

## 2015-03-31 DIAGNOSIS — E119 Type 2 diabetes mellitus without complications: Secondary | ICD-10-CM | POA: Diagnosis present

## 2015-03-31 DIAGNOSIS — I6529 Occlusion and stenosis of unspecified carotid artery: Secondary | ICD-10-CM | POA: Diagnosis present

## 2015-03-31 DIAGNOSIS — I2510 Atherosclerotic heart disease of native coronary artery without angina pectoris: Secondary | ICD-10-CM | POA: Diagnosis present

## 2015-03-31 DIAGNOSIS — I10 Essential (primary) hypertension: Secondary | ICD-10-CM

## 2015-03-31 DIAGNOSIS — F419 Anxiety disorder, unspecified: Secondary | ICD-10-CM | POA: Diagnosis present

## 2015-03-31 DIAGNOSIS — D72829 Elevated white blood cell count, unspecified: Secondary | ICD-10-CM | POA: Diagnosis present

## 2015-03-31 LAB — BASIC METABOLIC PANEL
Anion gap: 7 (ref 5–15)
BUN: 12 mg/dL (ref 6–20)
CHLORIDE: 102 mmol/L (ref 101–111)
CO2: 28 mmol/L (ref 22–32)
CREATININE: 1.48 mg/dL — AB (ref 0.61–1.24)
Calcium: 9.1 mg/dL (ref 8.9–10.3)
GFR, EST AFRICAN AMERICAN: 50 mL/min — AB (ref >60)
GFR, EST NON AFRICAN AMERICAN: 44 mL/min — AB (ref >60)
Glucose, Bld: 212 mg/dL — ABNORMAL HIGH (ref 65–99)
POTASSIUM: 3.9 mmol/L (ref 3.5–5.1)
SODIUM: 137 mmol/L (ref 135–145)

## 2015-03-31 LAB — CBC
HCT: 37.5 % — ABNORMAL LOW (ref 39.0–52.0)
Hemoglobin: 11.8 g/dL — ABNORMAL LOW (ref 13.0–17.0)
MCH: 26.4 pg (ref 26.0–34.0)
MCHC: 31.5 g/dL (ref 30.0–36.0)
MCV: 83.9 fL (ref 78.0–100.0)
PLATELETS: 147 10*3/uL — AB (ref 150–400)
RBC: 4.47 MIL/uL (ref 4.22–5.81)
RDW: 14.5 % (ref 11.5–15.5)
WBC: 8.2 10*3/uL (ref 4.0–10.5)

## 2015-03-31 LAB — GLUCOSE, CAPILLARY
GLUCOSE-CAPILLARY: 195 mg/dL — AB (ref 65–99)
GLUCOSE-CAPILLARY: 169 mg/dL — AB (ref 65–99)
GLUCOSE-CAPILLARY: 188 mg/dL — AB (ref 65–99)
GLUCOSE-CAPILLARY: 257 mg/dL — AB (ref 65–99)
Glucose-Capillary: 248 mg/dL — ABNORMAL HIGH (ref 65–99)

## 2015-03-31 LAB — LACTIC ACID, PLASMA: Lactic Acid, Venous: 1.6 mmol/L (ref 0.5–2.0)

## 2015-03-31 MED ORDER — ONDANSETRON HCL 4 MG/2ML IJ SOLN: 4.0000 mg | Freq: Four times a day (QID) | INTRAMUSCULAR | Status: DC | PRN

## 2015-03-31 MED ORDER — INSULIN GLARGINE 100 UNIT/ML ~~LOC~~ SOLN
5.0000 [IU] | Freq: Every day | SUBCUTANEOUS | Status: DC
  Administered 2015-03-31: 5 [IU] via SUBCUTANEOUS
  Filled 2015-03-31 (×2): qty 0.05

## 2015-03-31 MED ORDER — ALUM & MAG HYDROXIDE-SIMETH 200-200-20 MG/5ML PO SUSP: 30.0000 mL | Freq: Four times a day (QID) | ORAL | Status: DC | PRN

## 2015-03-31 MED ORDER — GABAPENTIN 300 MG PO CAPS
300.0000 mg | ORAL_CAPSULE | Freq: Three times a day (TID) | ORAL | Status: DC
  Administered 2015-03-31 – 2015-04-01 (×4): 300 mg via ORAL
  Filled 2015-03-31 (×4): qty 1

## 2015-03-31 MED ORDER — CLONAZEPAM 1 MG PO TABS
2.0000 mg | ORAL_TABLET | Freq: Every day | ORAL | Status: DC
Start: 2015-03-31 — End: 2015-03-31

## 2015-03-31 MED ORDER — LORAZEPAM 2 MG/ML IJ SOLN: 1.0000 mg | INTRAMUSCULAR | Status: DC | PRN

## 2015-03-31 MED ORDER — ONDANSETRON HCL 4 MG PO TABS: 4.0000 mg | ORAL_TABLET | Freq: Four times a day (QID) | ORAL | Status: DC | PRN

## 2015-03-31 MED ORDER — CLONAZEPAM 1 MG PO TABS
2.0000 mg | ORAL_TABLET | Freq: Every day | ORAL | Status: DC
  Administered 2015-03-31: 2 mg via ORAL
  Filled 2015-03-31: qty 2

## 2015-03-31 MED ORDER — SODIUM CHLORIDE 0.9 % IV SOLN
INTRAVENOUS | Status: DC
  Administered 2015-03-31 – 2015-04-01 (×3): via INTRAVENOUS

## 2015-03-31 MED ORDER — SODIUM CHLORIDE 0.9 % IJ SOLN: 3.0000 mL | Freq: Two times a day (BID) | INTRAMUSCULAR | Status: DC

## 2015-03-31 MED ORDER — INSULIN GLARGINE 100 UNIT/ML SOLOSTAR PEN: 5.0000 [IU] | PEN_INJECTOR | Freq: Every day | SUBCUTANEOUS | Status: DC

## 2015-03-31 MED ORDER — ACETAMINOPHEN 325 MG PO TABS: 650.0000 mg | ORAL_TABLET | Freq: Four times a day (QID) | ORAL | Status: DC | PRN

## 2015-03-31 MED ORDER — INSULIN ASPART 100 UNIT/ML ~~LOC~~ SOLN
0.0000 [IU] | Freq: Three times a day (TID) | SUBCUTANEOUS | Status: DC
  Administered 2015-03-31: 2 [IU] via SUBCUTANEOUS
  Administered 2015-04-01 (×2): 3 [IU] via SUBCUTANEOUS

## 2015-03-31 MED ORDER — INSULIN ASPART 100 UNIT/ML ~~LOC~~ SOLN
0.0000 [IU] | Freq: Three times a day (TID) | SUBCUTANEOUS | Status: DC
  Administered 2015-03-31: 3 [IU] via SUBCUTANEOUS

## 2015-03-31 MED ORDER — CARVEDILOL 12.5 MG PO TABS
12.5000 mg | ORAL_TABLET | Freq: Two times a day (BID) | ORAL | Status: DC
  Administered 2015-03-31 – 2015-04-01 (×3): 12.5 mg via ORAL
  Filled 2015-03-31 (×3): qty 1

## 2015-03-31 MED ORDER — ATORVASTATIN CALCIUM 20 MG PO TABS
20.0000 mg | ORAL_TABLET | Freq: Every day | ORAL | Status: DC
  Administered 2015-03-31: 20 mg via ORAL
  Filled 2015-03-31: qty 1

## 2015-03-31 MED ORDER — ACETAMINOPHEN 650 MG RE SUPP: 650.0000 mg | Freq: Four times a day (QID) | RECTAL | Status: DC | PRN

## 2015-03-31 MED ORDER — ASPIRIN EC 325 MG PO TBEC
325.0000 mg | DELAYED_RELEASE_TABLET | Freq: Every day | ORAL | Status: DC
  Administered 2015-03-31 – 2015-04-01 (×2): 325 mg via ORAL
  Filled 2015-03-31 (×2): qty 1

## 2015-03-31 MED ORDER — TRAZODONE HCL 50 MG PO TABS
50.0000 mg | ORAL_TABLET | Freq: Every day | ORAL | Status: DC
  Administered 2015-03-31: 50 mg via ORAL
  Filled 2015-03-31: qty 1

## 2015-03-31 MED ORDER — HEPARIN SODIUM (PORCINE) 5000 UNIT/ML IJ SOLN
5000.0000 [IU] | Freq: Three times a day (TID) | INTRAMUSCULAR | Status: DC
  Administered 2015-03-31 – 2015-04-01 (×4): 5000 [IU] via SUBCUTANEOUS
  Filled 2015-03-31 (×4): qty 1

## 2015-03-31 NOTE — Progress Notes (Signed)
Patient arrived on unit via carelink. Patient alert and oriented x4. Patient oriented to room, unit and staff. Patient placed on telemetry monitor, CCMD notified. Patient's IV clean, dry and intact. Skin assessment completed with two RN's, no skin issues noted. Patient rates pain 0/10. Safety Fall Prevention Plan was given, discussed and signed by patient. Call light has been placed within reach and bed alarm has been activated. RN will continue to monitor the patient.   Rivka BarbaraZenab Valentine Kuechle BSN, RN  Phone Number: [phone]

## 2015-03-31 NOTE — Procedures (Addendum)
History: 80 yo M with altered mental status  Sedation: On clonazepam at baseline  Technique: This is a 21 channel routine scalp EEG performed at the bedside with bipolar and monopolar montages arranged in accordance to the international 10/20 system of electrode placement. One channel was dedicated to EKG recording.    Background: The background consists of intermixed alpha and beta activities. There is in fact excessive frontally predominant beta activity. There is a well defined posterior dominant rhythm of 8 Hz that attenuates with eye opening. Sleep is recorded with normal appearing structures. There is a single concerning discharge in the left temporal region, however, I am not certain that this disrupts the background and is in the setting of an underlying temporal rhythm at the time therefore I think a wicket(normal variant) is more likely than epileptiform discharge.   Photic stimulation: Physiologic driving is not performed  EEG Abnormalities: None  Clinical Interpretation: This normal EEG is recorded in the waking and sleep state. There was no seizure or seizure predisposition recorded on this study. Please note that a normal EEG does not preclude the possibility of epilepsy.   Ritta SlotMcNeill Kirkpatrick, MD [facility] [phone]  If 7pm- 7am, please page neurology on call as listed in AMION.

## 2015-03-31 NOTE — H&P (Signed)
[facility] History and Physical  [name] UVO:[id number] DOB: 1934/11/07 DOA: 03/30/2015  Referring physician: ED physician PCP: Donzetta Sprung, MD  Specialists:   Chief Complaint: Altered mental status and weakness  HPI: [name] is a 80 y.o. male with PMH of seizure, hypertension, hyperlipidemia, diabetes mellitus, GERD, see daily-3, CAD, S/P stent placement, carotid artery stenosis, who presents with altered mental status and weakness.  Patient is transferred from [facility] per AP, [place] (attending Dr. Wendi Snipes)  Per patient's daughter, he became confused before. First, and had home body trembling activity. He had weakness in his legs, could not walk normally. He had a urinary incontinence during the episode. Patient did not have a chest pain, cough, shortness of breath, fever, chills, abdominal pain, diarrhea, symptoms of UTI, facial droop, slurred speech. His mental status has gradually improved. When I saw patient on the floor, patient's mental status has come back to baseline. He looks normal per his daughter. No weakness, numbness or tingling sensations in his extremities.   In ED, patient was found to have WBC is 13.9, lactate 2,2, PTT 22.7, temperature normal, no tachycardia, renal function stable, negative urinalysis. Chest x-ray showed mild pulmonary vascular congestion. CT head is negative for acute issues. MRI and MRA of brain has no acute issues, but showed remote lacunar right caudate infarction. Patient's admitted to inpatient for further eval and treatment. Neurology, Dr. Roseanne Reno was consulted by ED.  Where does patient live?   At home    Can patient participate in ADLs?  Yes    Review of Systems:   General: no fevers, chills, no changes in body weight, has poor appetite, has fatigue HEENT: no blurry vision, hearing changes or sore throat Pulm: no dyspnea, coughing, wheezing CV: no chest pain, palpitations Abd: no nausea, vomiting, abdominal  pain, diarrhea, constipation GU: no dysuria, burning on urination, increased urinary frequency, hematuria  Ext: no leg edema Neuro: no unilateral weakness, numbness, or tingling, no vision change or hearing loss. Has confusion and leg weakness. Skin: no rash MSK: No muscle spasm, no deformity, no limitation of range of movement in spin Heme: No easy bruising.  Travel history: No recent long distant travel.  Allergy:  Allergies  Allergen Reactions  . Omnipaque [Iohexol]   . Codeine Nausea And Vomiting    Past Medical History  Diagnosis Date  . Diabetes mellitus   . Hypertension   . Hyperlipidemia   . CAD (coronary artery disease)   . Leg pain     with walking  . Carotid artery occlusion   . CKD (chronic kidney disease), stage III   . Anxiety     Past Surgical History  Procedure Laterality Date  . Back surgery  07/10/11  . Carotid endarterectomy  01/10/11    Left Carotid  . Coronary stent placement  08/09/2005    Right  . Spine surgery  07/10/11    Social History:  reports that he quit smoking about 33 years ago. His smoking use included Cigarettes. He does not have any smokeless tobacco history on file. He reports that he does not drink alcohol or use illicit drugs.  Family History:  Family History  Problem Relation Age of Onset  . Hypertension Mother   . Deep vein thrombosis Mother   . Diabetes Mother   . Heart disease Mother   . COPD Father   . Heart disease Sister   . Heart attack Sister     Bypass  . Heart disease Brother  Heart Disease before age 78  . Heart disease Brother   . Hypertension Son      Prior to Admission medications   Medication Sig Start Date End Date Taking? Authorizing Provider  aspirin EC 325 MG EC tablet Take 1 tablet (325 mg total) by mouth daily. 05/14/13   Hoyt KochJessica Upchurch Vann, DO  carvedilol (COREG) 12.5 MG tablet Take 1 tablet (12.5 mg total) by mouth 2 (two) times daily with a meal. 05/14/13   Hoyt KochJessica Upchurch Vann, DO   divalproex (DEPAKOTE) 250 MG DR tablet Take 1 tablet (250 mg total) by mouth every 12 (twelve) hours. 05/14/13   Hoyt KochJessica Upchurch Vann, DO  insulin aspart (NOVOLOG FLEXPEN) 100 UNIT/ML FlexPen Inject 5 Units into the skin 3 (three) times daily with meals. 05/14/13   Hoyt KochJessica Upchurch Vann, DO  Insulin Glargine (LANTUS) 100 UNIT/ML Solostar Pen Inject 18 Units into the skin daily at 10 pm. 05/14/13   Hoyt KochJessica Upchurch Vann, DO  simvastatin (ZOCOR) 80 MG tablet Take 80 mg by mouth at bedtime.      Historical Provider, MD  traZODone (DESYREL) 50 MG tablet Take 50 mg by mouth at bedtime.  08/11/11   Historical Provider, MD    Physical Exam: Filed Vitals:   03/30/15 2314  BP: 150/91  Pulse: 91  Temp: 98.4 F (36.9 C)  TempSrc: Oral  Resp: 16  Height: 5\' 7"  (1.702 m)  Weight: 84.823 kg (187 lb)  SpO2: 97%   General: Not in acute distress HEENT:       Eyes: PERRL, EOMI, no scleral icterus.       ENT: No discharge from the ears and nose, no pharynx injection, no tonsillar enlargement.        Neck: No JVD, no bruit, no mass felt. Heme: No neck lymph node enlargement. Cardiac: S1/S2, RRR, No murmurs, No gallops or rubs. Pulm:   No rales, wheezing, rhonchi or rubs. Abd: Soft, nondistended, nontender, no rebound pain, no organomegaly, BS present. Ext: No pitting leg edema bilaterally. 2+DP/PT pulse bilaterally. Musculoskeletal: No joint deformities, No joint redness or warmth, no limitation of ROM in spin. Skin: No rashes.  Neuro: Alert, oriented X3, cranial nerves II-XII grossly intact, muscle strength 5/5 in all extremities, sensation to light touch intact. Brachial reflex 2+ bilaterally. Knee reflex 1+ bilaterally. Negative Babinski's sign. Normal finger to nose test. Psych: Patient is not psychotic, no suicidal or hemocidal ideation.  Labs on Admission:  Basic Metabolic Panel: No results for input(s): NA, K, CL, CO2, GLUCOSE, BUN, CREATININE, CALCIUM, MG, PHOS in the last 168 hours. Liver  Function Tests: No results for input(s): AST, ALT, ALKPHOS, BILITOT, PROT, ALBUMIN in the last 168 hours. No results for input(s): LIPASE, AMYLASE in the last 168 hours. No results for input(s): AMMONIA in the last 168 hours. CBC: No results for input(s): WBC, NEUTROABS, HGB, HCT, MCV, PLT in the last 168 hours. Cardiac Enzymes: No results for input(s): CKTOTAL, CKMB, CKMBINDEX, TROPONINI in the last 168 hours.  BNP (last 3 results) No results for input(s): BNP in the last 8760 hours.  ProBNP (last 3 results) No results for input(s): PROBNP in the last 8760 hours.  CBG:  Recent Labs Lab 03/30/15 2328  GLUCAP 163*    Radiological Exams on Admission: No results found.  EKG: Independently reviewed.  QTC 409, low voltage in limb leads   Assessment/Plan Principal Problem:   Acute encephalopathy Active Problems:   HLD (hyperlipidemia)   Essential hypertension   CAD, NATIVE VESSEL  Seizure (HCC)   DM type 2 (diabetes mellitus, type 2) (HCC)   CAD (coronary artery disease)   Carotid artery occlusion   CKD (chronic kidney disease), stage III   Diabetes mellitus without complication (HCC)   Anxiety   Leukocytosis  Acute encephalopathy: Etiology is not clear. CT-head and MRI/MRA of brain did not show new issues. According to his daughter's prescription of his symptoms, it seems to me that he had seizure activity (whole body trembling). Elevated lactate level without signs of infection is consistence with this possibility. Neurology was consulted, Dr. Roseanne Reno will see patient. Currently patient does not have seizure activity, his mental status is normal.  -will admit to tele bed -seizure precaution -f/u neurology's recommendation  Seizurre: Patient used to be on Depakote, which was switched to gabapentin 300 mg 3 times a day. -EEG -seizure precaution -Continue gabapentin -Ativan prn    HLD: Last LDL was not on record -Lipitor -Check FLP  HTN: -Coreg  CAD: s/p of  stent. No CP. -Aspirin, Coreg, Lipitor  DM-II: Last A1c 7.8 on 05/05/13, not well controled. Patient is taking metformin, glipizide at home. Blood sugar is 262 on admission -Start lantus 5 units daily -SSI  CKD (chronic kidney disease), stage III: stable. Baseline creatinine 1.35. His creatinine is 1.38 on admission. -Follow-up renal function by BMP  Anxiety: -Continue home Klonopin  Elevated lactate: 2.2 on admission. Likely due to seizure.  -IVF: NS 100cc/h -repeat lactate in AM  Leukocytosis: WBC=13.9. No signs of infection. Likely due to stress induced to de-margination. -follow up by CBC -will get blood culture if develops fever.   DVT ppx: SQ Heparin    Code Status: Full code Family Communication:   Yes, patient's  Wife and daughter at bed side Disposition Plan: Admit to inpatient   Date of Service 03/31/2015    Lorretta Harp [facility] Pager [phone]  If 7PM-7AM, please contact night-coverage [email or web address] Password [email or web address] 03/31/2015, 4:40 AM

## 2015-03-31 NOTE — Progress Notes (Signed)
I have seen and assessed the patient and agree with Dr.Niu's assessment and plan. Patient currently at baseline feels well. EEG has been obtained with no epileptiform activity noted. Patient on his home regimen of gabapentin. Neurology following.

## 2015-03-31 NOTE — Progress Notes (Signed)
EEG completed; results pending.    

## 2015-03-31 NOTE — Consult Note (Signed)
Admission H&P    Chief Complaint: Altered mental status and lower extremity weakness.  HPI: [name] is an 80 y.o. male history diabetes mellitus, hypertension, hyperlipidemia, coronary artery disease and carotid occlusion, transferred from [facility] hospital for further evaluation of altered mental status and complaint of weakness of lower extremities. Patient reportedly came confused and had shaking of his extremities but unclear if he lost consciousness. He was incontinent of urine. Patient indicates he remembers the episode and had difficulty with each output but could not understand what was going on around him. He has similar presentation in January 2015 and was hospitalized here. Workup was unremarkable, including MRI and EEG. MRI of his brain at [facility] yesterday showed no acute abnormality. Laboratory studies were unremarkable except for glucose high 200s. He was afebrile. Mental status subsequently improved, as did lower extremity weakness.  Past Medical History  Diagnosis Date  . Diabetes mellitus   . Hypertension   . Hyperlipidemia   . CAD (coronary artery disease)   . Leg pain     with walking  . Carotid artery occlusion   . CKD (chronic kidney disease), stage III   . Anxiety     Past Surgical History  Procedure Laterality Date  . Back surgery  07/10/11  . Carotid endarterectomy  01/10/11    Left Carotid  . Coronary stent placement  08/09/2005    Right  . Spine surgery  07/10/11    Family History  Problem Relation Age of Onset  . Hypertension Mother   . Deep vein thrombosis Mother   . Diabetes Mother   . Heart disease Mother   . COPD Father   . Heart disease Sister   . Heart attack Sister     Bypass  . Heart disease Brother     Heart Disease before age 72  . Heart disease Brother   . Hypertension Son    Social History:  reports that he quit smoking about 33 years ago. His smoking use included Cigarettes. He does not have any smokeless tobacco  history on file. He reports that he does not drink alcohol or use illicit drugs.  Allergies:  Allergies  Allergen Reactions  . Omnipaque [Iohexol]   . Codeine Nausea And Vomiting    Medications Prior to Admission  Medication Sig Dispense Refill  . aspirin EC 325 MG EC tablet Take 1 tablet (325 mg total) by mouth daily. 30 tablet 0  . carvedilol (COREG) 12.5 MG tablet Take 1 tablet (12.5 mg total) by mouth 2 (two) times daily with a meal. 60 tablet 0  . divalproex (DEPAKOTE) 250 MG DR tablet Take 1 tablet (250 mg total) by mouth every 12 (twelve) hours. 60 tablet 0  . insulin aspart (NOVOLOG FLEXPEN) 100 UNIT/ML FlexPen Inject 5 Units into the skin 3 (three) times daily with meals. 15 mL 11  . Insulin Glargine (LANTUS) 100 UNIT/ML Solostar Pen Inject 18 Units into the skin daily at 10 pm. 15 mL 11  . simvastatin (ZOCOR) 80 MG tablet Take 80 mg by mouth at bedtime.      . traZODone (DESYREL) 50 MG tablet Take 50 mg by mouth at bedtime.       ROS: History obtained from the patient  General ROS: negative for - chills, fatigue, fever, night sweats, weight gain or weight loss Psychological ROS: negative for - behavioral disorder, hallucinations, memory difficulties, mood swings or suicidal ideation Ophthalmic ROS: negative for - blurry vision, double vision, eye pain or loss  of vision ENT ROS: negative for - epistaxis, nasal discharge, oral lesions, sore throat, tinnitus or vertigo Allergy and Immunology ROS: negative for - hives or itchy/watery eyes Hematological and Lymphatic ROS: negative for - bleeding problems, bruising or swollen lymph nodes Endocrine ROS: negative for - galactorrhea, hair pattern changes, polydipsia/polyuria or temperature intolerance Respiratory ROS: negative for - cough, hemoptysis, shortness of breath or wheezing Cardiovascular ROS: negative for - chest pain, dyspnea on exertion, edema or irregular heartbeat Gastrointestinal ROS: negative for - abdominal pain,  diarrhea, hematemesis, nausea/vomiting or stool incontinence Genito-Urinary ROS: negative for - dysuria, hematuria, incontinence or urinary frequency/urgency Musculoskeletal ROS: negative for - joint swelling or muscular weakness Neurological ROS: as noted in HPI Dermatological ROS: negative for rash and skin lesion changes  Physical Examination: Blood pressure 101/49, pulse 69, temperature 98.4 F (36.9 C), temperature source Oral, resp. rate 16, height _0  (1.702 m), weight 84.823 kg (187 lb), SpO2 98 %.  HEENT-  Normocephalic, no lesions, without obvious abnormality.  Normal external eye and conjunctiva.  Normal TM's bilaterally.  Normal auditory canals and external ears. Normal external nose, mucus membranes and septum.  Normal pharynx. Neck supple with no masses, nodes, nodules or enlargement. Cardiovascular - regular rate and rhythm, S1, S2 normal, no murmur, click, rub or gallop Lungs - chest clear, no wheezing, rales, normal symmetric air entry Abdomen - soft, non-tender; bowel sounds normal; no masses,  no organomegaly Extremities - no joint deformities, effusion, or inflammation and no edema  Neurologic Examination: Mental Status: Alert, oriented, thought content appropriate.  Speech fluent without evidence of aphasia. Able to follow commands without difficulty. Cranial Nerves: II-Visual fields were normal. III/IV/VI-Pupils were equal and reacted normally to light. Extraocular movements were full and conjugate.    V/VII-no facial numbness and no facial weakness. VIII-normal. X-normal speech. XI: trapezius strength/neck flexion strength normal bilaterally XII-midline tongue extension with normal strength. Motor: 5/5 bilaterally with normal tone and bulk Sensory: Normal throughout. Deep Tendon Reflexes: Trace to 1+ and symmetric. Plantars: Mute bilaterally Cerebellar: Normal finger-to-nose testing. Carotid auscultation: Normal  Results for orders placed or performed  during the hospital encounter of 03/30/15 (from the past 48 hour(s))  Glucose, capillary     Status: Abnormal   Collection Time: 03/30/15 11:28 PM  Result Value Ref Range   Glucose-Capillary 163 (H) 65 - 99 mg/dL   Comment 1 Notify RN    Comment 2 Document in Chart   Basic metabolic panel     Status: Abnormal   Collection Time: 03/31/15  5:15 AM  Result Value Ref Range   Sodium 137 135 - 145 mmol/L   Potassium 3.9 3.5 - 5.1 mmol/L   Chloride 102 101 - 111 mmol/L   CO2 28 22 - 32 mmol/L   Glucose, Bld 212 (H) 65 - 99 mg/dL   BUN 12 6 - 20 mg/dL   Creatinine, Ser 1.48 (H) 0.61 - 1.24 mg/dL   Calcium 9.1 8.9 - 10.3 mg/dL   GFR calc non Af Amer 44 (L) >60 mL/min   GFR calc Af Amer 50 (L) >60 mL/min    Comment: (NOTE) The eGFR has been calculated using the CKD EPI equation. This calculation has not been validated in all clinical situations. eGFR's persistently <60 mL/min signify possible Chronic Kidney Disease.    Anion gap 7 5 - 15  CBC     Status: Abnormal   Collection Time: 03/31/15  5:15 AM  Result Value Ref Range   WBC 8.2 4.0 -  10.5 K/uL   RBC 4.47 4.22 - 5.81 MIL/uL   Hemoglobin 11.8 (L) 13.0 - 17.0 g/dL   HCT 37.5 (L) 39.0 - 52.0 %   MCV 83.9 78.0 - 100.0 fL   MCH 26.4 26.0 - 34.0 pg   MCHC 31.5 30.0 - 36.0 g/dL   RDW 14.5 11.5 - 15.5 %   Platelets 147 (L) 150 - 400 K/uL  Lactic acid, plasma     Status: None   Collection Time: 03/31/15  5:15 AM  Result Value Ref Range   Lactic Acid, Venous 1.6 0.5 - 2.0 mmol/L  Glucose, capillary     Status: Abnormal   Collection Time: 03/31/15  5:30 AM  Result Value Ref Range   Glucose-Capillary 195 (H) 65 - 99 mg/dL   No results found.  Assessment/Plan 80 year old man presenting with a recurrent episode of altered mental status, shaking of extremities and apparent weakness of lower extremities transiently. Etiology is unclear. Partial seizure is a consideration, given the recurrent pattern of symptomatology. He does not have  prolonged status changes, as seen in January 2015. He was not started on anticonvulsant medication at that time. Mental status changes have resolved, as is weakness of his lower extremities.  Recommendations: 1. Repeat EEG study 2. PT consult for evaluation of patient's gait 3. Will defer on term anticonvulsant medication, for now. 4. Continue cardiac telemetry  C.R. Nicole Kindred, [place] [facility] [phone]  03/31/2015, 6:13 AM

## 2015-03-31 NOTE — Evaluation (Signed)
Physical Therapy Evaluation Patient Details Name: [name] Skillman MRN: [id number] DOB: 03/03/1935 Today's Date: 03/31/2015   History of Present Illness  [name] is a 80 y.o. male with PMH of seizure, hypertension, hyperlipidemia, diabetes mellitus, GERD, see daily-3, CAD, S/P stent placement, carotid artery stenosis, who presents with altered mental status and weakness.  Clinical Impression  Pt functioning at supervision/min guard level. Pt does demo increased falls risk as indicated by score of 19 on DGI. Pt demo'd sound cognition during PT eval however RN tech reports pt to have intermittent confusion. Pt safe to d/c home with 24/7 supervision, if this is not available pt will need ST-SNF to achieve safe mod I level of function.    Follow Up Recommendations Home health PT;Supervision/Assistance - 24 hour    Equipment Recommendations  None recommended by PT    Recommendations for Other Services       Precautions / Restrictions Precautions Precautions: Fall Precaution Comments: intermittent confusion Restrictions Weight Bearing Restrictions: No      Mobility  Bed Mobility Overal bed mobility: Needs Assistance Bed Mobility: Supine to Sit     Supine to sit: Supervision     General bed mobility comments: good technique, no physical assist required  Transfers Overall transfer level: Needs assistance Equipment used: None Transfers: Sit to/from Stand Sit to Stand: Min guard         General transfer comment: pt mildly unsteady but suspect due to first time getting up  Ambulation/Gait Ambulation/Gait assistance: Min guard Ambulation Distance (Feet): 200 Feet Assistive device: None Gait Pattern/deviations: Step-through pattern;Drifts right/left Gait velocity: wfl   General Gait Details: pt mildly unsteady but no overt episodes of LOB.  Stairs Stairs: Yes Stairs assistance: Min guard Stair Management: One rail Left;Alternating pattern Number of Stairs:  3 General stair comments: limited by IV pole  Wheelchair Mobility    Modified Rankin (Stroke Patients Only)       Balance                                 Standardized Balance Assessment Standardized Balance Assessment : Dynamic Gait Index   Dynamic Gait Index Level Surface: Mild Impairment Change in Gait Speed: Mild Impairment Gait with Horizontal Head Turns: Mild Impairment Gait with Vertical Head Turns: Mild Impairment Gait and Pivot Turn: Mild Impairment Step Over Obstacle: Mild Impairment Step Around Obstacles: Mild Impairment Steps: Mild Impairment Total Score: 16       Pertinent Vitals/Pain Pain Assessment: No/denies pain    Home Living Family/patient expects to be discharged to:: Private residence Living Arrangements: Spouse/significant other Available Help at Discharge: Family;Available 24 hours/day Type of Home: House Home Access: Stairs to enter Entrance Stairs-Rails: None Entrance Stairs-Number of Steps: 1 Home Layout: One level Home Equipment: Environmental consultantWalker - 2 wheels      Prior Function Level of Independence: Independent               Hand Dominance   Dominant Hand: Right    Extremity/Trunk Assessment   Upper Extremity Assessment: Overall WFL for tasks assessed           Lower Extremity Assessment: Overall WFL for tasks assessed      Cervical / Trunk Assessment: Normal  Communication   Communication: HOH  Cognition Arousal/Alertness: Awake/alert Behavior During Therapy: WFL for tasks assessed/performed Overall Cognitive Status: Within Functional Limits for tasks assessed (RN tech with report of intermittent confusion )  General Comments      Exercises        Assessment/Plan    PT Assessment Patient needs continued PT services  PT Diagnosis Difficulty walking   PT Problem List Decreased strength;Decreased range of motion;Decreased activity tolerance;Decreased balance;Decreased  mobility  PT Treatment Interventions DME instruction;Gait training;Stair training;Functional mobility training;Therapeutic activities;Therapeutic exercise   PT Goals (Current goals can be found in the Care Plan section) Acute Rehab PT Goals Patient Stated Goal: home PT Goal Formulation: With patient Time For Goal Achievement: 04/07/15 Potential to Achieve Goals: Good Additional Goals Additional Goal #1: Pt to score >21 on DGI to indicate minimal falls risk.    Frequency Min 3X/week   Barriers to discharge        Co-evaluation               End of Session Equipment Utilized During Treatment: Gait belt Activity Tolerance: Patient tolerated treatment well Patient left: in chair;with call bell/phone within reach Nurse Communication: Mobility status         Time: 0454-0981 PT Time Calculation (min) (ACUTE ONLY): 19 min   Charges:   PT Evaluation $Initial PT Evaluation Tier I: 1 Procedure     PT G CodesMarcene Brawn 03/31/2015, 2:23 PM   Lewis Shock, PT, DPT Pager #: [phone] Office #: [phone]

## 2015-03-31 NOTE — Progress Notes (Signed)
Per MD order, RN administered patient's home medications due to epic downtime.   Following medications administered: Metformin 500mg  , 2 tablets Gabapentin 300mg , 2 tablets Glipizide 5mg , 1 tablet Simvastatin 40mg , 1 tablet Carvedilol 12.5 mg, 1 tablet Clonazepam 1mg , 2 tablets Trazodone 50mg , 1 tablet  Keylor Rands I, RN

## 2015-04-01 DIAGNOSIS — E119 Type 2 diabetes mellitus without complications: Secondary | ICD-10-CM

## 2015-04-01 LAB — BASIC METABOLIC PANEL
ANION GAP: 8 (ref 5–15)
BUN: 12 mg/dL (ref 6–20)
CALCIUM: 8.7 mg/dL — AB (ref 8.9–10.3)
CO2: 25 mmol/L (ref 22–32)
Chloride: 105 mmol/L (ref 101–111)
Creatinine, Ser: 1.4 mg/dL — ABNORMAL HIGH (ref 0.61–1.24)
GFR, EST AFRICAN AMERICAN: 54 mL/min — AB (ref >60)
GFR, EST NON AFRICAN AMERICAN: 47 mL/min — AB (ref >60)
Glucose, Bld: 215 mg/dL — ABNORMAL HIGH (ref 65–99)
POTASSIUM: 4.3 mmol/L (ref 3.5–5.1)
SODIUM: 138 mmol/L (ref 135–145)

## 2015-04-01 LAB — GLUCOSE, CAPILLARY
GLUCOSE-CAPILLARY: 218 mg/dL — AB (ref 65–99)
Glucose-Capillary: 235 mg/dL — ABNORMAL HIGH (ref 65–99)

## 2015-04-01 NOTE — Discharge Summary (Signed)
Physician Discharge Summary  [facility] ZOX:[id number] DOB: [birth year] DOA: 03/30/2015  PCP: Donzetta Sprung, MD  Admit date: 03/30/2015 Discharge date: 04/01/2015  Time spent: 65 minutes  Recommendations for Outpatient Follow-up:  1. Follow-up with Donzetta Sprung, MD in 2 weeks. On follow-up patient will need a basic metabolic profile done to follow-up on his electrolytes and renal function. 2. Follow-up with neurology in 3-4 weeks for further evaluation of patient's hospitalization.   Discharge Diagnoses:  Principal Problem:   Acute encephalopathy Active Problems:   HLD (hyperlipidemia)   Essential hypertension   CAD, NATIVE VESSEL   Seizure (HCC)   DM type 2 (diabetes mellitus, type 2) (HCC)   CAD (coronary artery disease)   Carotid artery occlusion   CKD (chronic kidney disease), stage III   Diabetes mellitus without complication (HCC)   Anxiety   Leukocytosis   Discharge Condition: Stable and improved  Diet recommendation: Heart healthy  Filed Weights   03/30/15 2314 03/31/15 2015  Weight: 84.823 kg (187 lb) 85.1 kg (187 lb 9.8 oz)    History of present illness:  Per [employer] is a 80 y.o. male with PMH of seizure, hypertension, hyperlipidemia, diabetes mellitus, GERD, see daily-3, CAD, S/P stent placement, carotid artery stenosis, who presented with altered mental status and weakness.  Patient was transferred from [facility] per AP, [place] (attending Dr. Wendi Snipes)  Per patient's daughter, he became confused before. First, and had whole body trembling activity. He had weakness in his legs, could not walk normally. He had a urinary incontinence during the episode. Patient did not have a chest pain, cough, shortness of breath, fever, chills, abdominal pain, diarrhea, symptoms of UTI, facial droop, slurred speech. His mental status had gradually improved. When admitting M.D, saw patient on the floor, patient's mental status had come back to baseline.  He looked normal per his daughter. No weakness, numbness or tingling sensations in his extremities.  In ED, patient was found to have WBC is 13.9, lactate 2,2, PTT 22.7, temperature normal, no tachycardia, renal function stable, negative urinalysis. Chest x-ray showed mild pulmonary vascular congestion. CT head was negative for acute issues. MRI and MRA of brain has no acute issues, but showed remote lacunar right caudate infarction. Patient's admitted to inpatient for further eval and treatment. Neurology, Dr. Roseanne Reno was consulted by ED.  Hospital Course:  #1 Recurrent acute encephalopathy with shaking of extremities and lower extremity weakness transiently Patient was admitted with recurrent acute encephalopathy with shaking of extremities or lower extremity weakness transiently with concerns for seizure-like activity. Patient was also noted to have urinary incontinence. On presentation to the ED patient's symptoms had improved. Neurology was consulted and patient was seen in consultation by Dr. Roseanne Reno. MRI of the brain done at [facility] was reviewed with no acute abnormalities per neurology. Patient's lab work was unremarkable as well and patient was afebrile with no signs of infection. Patient underwent a EEG on 03/31/2015 which was a normal EEG with no seizure or seizure predisposition recorded on the study. Patient was maintained on home regimen of gabapentin. Patient did not have any further episodes and patient be discharged home in stable and improved condition. Patient is to follow-up with neurology as outpatient.  #2 hyperlipidemia Patient was maintained on home regimen of Lipitor.  #3 hypertension  Patient was maintained on home regimen of Coreg.  #4 coronary artery disease status post stent Remained stable. Patient was maintained on his cardiac medications: Aspirin, Coreg, Lipitor.  #5 leukocytosis  On  admission patient was noted to have a leukocytosis with a white count of  13.9. Felt to be likely stress-induced. Patient remained afebrile. Patient with no signs or symptoms of infection. Leukocytosis had resolved by day of discharge.  The rest of patient's chronic medical issues Mr. with throughout the hospitalization and patient be discharged in stable and improved condition.  Procedures:  EEG 03/31/2015  Consultations:  Neurology: Dr Roseanne RenoStewart 03/31/2015  Discharge Exam: Filed Vitals:   04/01/15 0411 04/01/15 0756  BP: 121/59 129/62  Pulse: 78 81  Temp: 98.5 F (36.9 C) 97.9 F (36.6 C)  Resp: 19 18    General: NAD Cardiovascular: RRR Respiratory: CTAB  Discharge Instructions   Discharge Instructions    Diet general    Complete by:  As directed      Discharge instructions    Complete by:  As directed   Follow up with neurology in 3-4 weeks. Follow up with Donzetta SprungANIEL, TERRY, MD in 2 weeks.     Increase activity slowly    Complete by:  As directed           Current Discharge Medication List    CONTINUE these medications which have NOT CHANGED   Details  aspirin EC 325 MG EC tablet Take 1 tablet (325 mg total) by mouth daily. Qty: 30 tablet, Refills: 0    carvedilol (COREG) 12.5 MG tablet Take 1 tablet (12.5 mg total) by mouth 2 (two) times daily with a meal. Qty: 60 tablet, Refills: 0    clonazePAM (KLONOPIN) 1 MG tablet Take 2 mg by mouth at bedtime.  Refills: 1    gabapentin (NEURONTIN) 300 MG capsule Take 600 mg by mouth 2 (two) times daily. Refills: 1    glipiZIDE (GLUCOTROL) 5 MG tablet Take 5 mg by mouth 2 (two) times daily. Refills: 0    metFORMIN (GLUCOPHAGE-XR) 500 MG 24 hr tablet Take 1,000 mg by mouth 2 (two) times daily. Refills: 0    omeprazole (PRILOSEC) 20 MG capsule Take 20 mg by mouth daily. Refills: 3    simvastatin (ZOCOR) 80 MG tablet Take 40 mg by mouth at bedtime.     traZODone (DESYREL) 50 MG tablet Take 50 mg by mouth at bedtime.        Allergies  Allergen Reactions  . Omnipaque [Iohexol]   .  Paxil [Paroxetine Hcl]     Unknown reaction, on file with [facility]  . Topamax [Topiramate]     Unknown reaction, on file with [facility]  . Codeine Nausea And Vomiting   Follow-up Information    Follow up with Donzetta SprungANIEL, TERRY, MD. Schedule an appointment as soon as possible for a visit in 2 weeks.   Specialty:  Family Medicine   Contact information:   [address] [place] [address] [phone]       Follow up with [facility]. Schedule an appointment as soon as possible for a visit in 3 weeks.   Why:  f/u in 3-4 weeks   Contact information:   [address] [place] [id number] [phone]       The results of significant diagnostics from this hospitalization (including imaging, microbiology, ancillary and laboratory) are listed below for reference.    Significant Diagnostic Studies: No results found.  Microbiology: No results found for this or any previous visit (from the past 240 hour(s)).   Labs: Basic Metabolic Panel:  Recent Labs Lab 03/31/15 0515 04/01/15 0753  NA 137 138  K 3.9  4.3  CL 102 105  CO2 28 25  GLUCOSE 212* 215*  BUN 12 12  CREATININE 1.48* 1.40*  CALCIUM 9.1 8.7*   Liver Function Tests: No results for input(s): AST, ALT, ALKPHOS, BILITOT, PROT, ALBUMIN in the last 168 hours. No results for input(s): LIPASE, AMYLASE in the last 168 hours. No results for input(s): AMMONIA in the last 168 hours. CBC:  Recent Labs Lab 03/31/15 0515  WBC 8.2  HGB 11.8*  HCT 37.5*  MCV 83.9  PLT 147*   Cardiac Enzymes: No results for input(s): CKTOTAL, CKMB, CKMBINDEX, TROPONINI in the last 168 hours. BNP: BNP (last 3 results) No results for input(s): BNP in the last 8760 hours.  ProBNP (last 3 results) No results for input(s): PROBNP in the last 8760 hours.  CBG:  Recent Labs Lab 03/31/15 1131 03/31/15 1623 03/31/15 2014 04/01/15 0755 04/01/15 1137  GLUCAP 248* 188* 257* 218* 235*        Signed:  Demetress Tift MD [facility] 04/01/2015, 12:27 PM

## 2015-04-01 NOTE — Care Management Note (Signed)
Case Management Note  Patient Details  Name: [name] MRN: [id number] Date of Birth: 07-Aug-1934  Subjective/Objective:         CM following for progression and d/c planning.           Action/Plan: 04/01/2015 Met with pt and family, pt wife states that they have used [facility] in the past and would prefer that agency again. Pt has a borrowed walker which needs to be repl12/15/2016  Expected Discharge Date:       04/01/2015           Expected Discharge Plan:  [place]  In-House Referral:  NA  Discharge planning Services  CM Consult  Post Acute Care Choice:  Durable Medical Equipment, Home Health Choice offered to:  Spouse  DME Arranged:  [facility]ford Rile rolling DME Agency:  [place] Arranged:  PT [facility] Agency:  [place]  Status of Service:  Completed, signed off  Medicare Important Message Given:    Date Medicare IM Given:    Medicare IM give by:    Date Additional Medicare IM Given:    Additional Medicare Important Message give by:     If discussed at [place] of Stay Meetings, dates discussed:    Additional Comments:  Adron Bene, RN 04/01/2015, 10:30 AM

## 2015-04-01 NOTE — Progress Notes (Signed)
OT Cancellation Note  Patient Details Name: Era BumpersCleveland Grim MRN: [id number] DOB: 08/06/1934   Cancelled Treatment:    Reason Eval/Treat Not Completed: Other (comment) Pt apparently back to baseline. Acute OT signing off. Any needs [facility]  [facility]  Kennadie Brenner, OTR/L  [phone] 04/01/2015 04/01/2015, 12:57 PM

## 2015-04-01 NOTE — Progress Notes (Signed)
NEURO HOSPITALIST PROGRESS NOTE   SUBJECTIVE:                                                                                                                        Uneventful night. In good spirits. Stated that he feels back to baseline, his legs getting stronger day by day. EEG normal. [facility] was unremarkable. On gabapentin. OBJECTIVE:                                                                                                                           Vital signs in last 24 hours: Temp:  [97.9 F (36.6 C)-98.6 F (37 C)] 97.9 F (36.6 C) (12/15 0756) Pulse Rate:  [73-81] 81 (12/15 0756) Resp:  [16-19] 18 (12/15 0756) BP: (117-139)/(59-77) 129/62 mmHg (12/15 0756) SpO2:  [93 %-99 %] 94 % (12/15 0756) Weight:  [85.1 kg (187 lb 9.8 oz)] 85.1 kg (187 lb 9.8 oz) (12/14 2015)  Intake/Output from previous day: 12/14 0701 - 12/15 0700 In: 2454.2 [P.O.:720; I.V.:1734.2] Out: 0  Intake/Output this shift:   Nutritional status: Diet heart healthy/carb modified Room service appropriate?: Yes; Fluid consistency:: Thin  Past Medical History  Diagnosis Date  . Diabetes mellitus   . Hypertension   . Hyperlipidemia   . CAD (coronary artery disease)   . Leg pain     with walking  . Carotid artery occlusion   . CKD (chronic kidney disease), stage III   . Anxiety    Physical Examination: HEENT- Normocephalic, no lesions, without obvious abnormality. Normal external eye and conjunctiva. Normal TM's bilaterally. Normal auditory canals and external ears. Normal external nose, mucus membranes and septum. Normal pharynx. Neck supple with no masses, nodes, nodules or enlargement. Cardiovascular - regular rate and rhythm, S1, S2 normal, no murmur, click, rub or gallop Lungs - chest clear, no wheezing, rales, normal symmetric air entry Abdomen - soft, non-tender; bowel sounds normal; no masses, no  organomegaly Extremities - no joint deformities, effusion, or inflammation and no edema  Neurologic Exam:  Mental Status: Alert, oriented, thought content appropriate. Speech fluent without evidence of aphasia. Able to follow commands without difficulty. Cranial Nerves: II-Visual fields were normal. III/IV/VI-Pupils were equal and  reacted normally to light. Extraocular movements were full and conjugate.  V/VII-no facial numbness and no facial weakness. VIII-normal. X-normal speech. XI: trapezius strength/neck flexion strength normal bilaterally XII-midline tongue extension with normal strength. Motor: 5/5 bilaterally with normal tone and bulk Sensory: Normal throughout. Deep Tendon Reflexes: Trace to 1+ and symmetric. Plantars: Mute bilaterally Cerebellar: Normal finger-to-nose testing. Gait: no tested due to multiple leads  Lab Results: No results found for: CHOL Lipid Panel No results for input(s): CHOL, TRIG, HDL, CHOLHDL, VLDL, LDLCALC in the last 72 hours.  Studies/Results: No results found.  MEDICATIONS                                                                                                                        Scheduled: . aspirin EC  325 mg Oral Daily  . atorvastatin  20 mg Oral q1800  . carvedilol  12.5 mg Oral BID WC  . clonazePAM  2 mg Oral QHS  . gabapentin  300 mg Oral TID  . heparin  5,000 Units Subcutaneous 3 times per day  . insulin aspart  0-9 Units Subcutaneous TID WC  . insulin glargine  5 Units Subcutaneous QHS  . sodium chloride  3 mL Intravenous Q12H  . traZODone  50 mg Oral QHS    ASSESSMENT/PLAN:                                                                                                            80 year old man presenting with a recurrent episode of altered mental status, shaking of extremities and apparent weakness of lower extremities transiently. Etiology is unclear. MRI brain and EEG unremarkable. Patient is back to  baseline. Will continue current dose gabapentin. Outpatient neurology follow up in 3-4 weeks. Neuro will sign off.  [name] Portela, MD [facility] [phone]  04/01/2015, 8:02 AM

## 2015-04-18 DIAGNOSIS — R410 Disorientation, unspecified: Secondary | ICD-10-CM | POA: Diagnosis not present

## 2015-04-18 DIAGNOSIS — E119 Type 2 diabetes mellitus without complications: Secondary | ICD-10-CM | POA: Diagnosis not present

## 2015-04-18 DIAGNOSIS — I2510 Atherosclerotic heart disease of native coronary artery without angina pectoris: Secondary | ICD-10-CM | POA: Diagnosis not present

## 2015-04-18 DIAGNOSIS — E785 Hyperlipidemia, unspecified: Secondary | ICD-10-CM | POA: Diagnosis not present

## 2015-04-18 DIAGNOSIS — K219 Gastro-esophageal reflux disease without esophagitis: Secondary | ICD-10-CM | POA: Diagnosis not present

## 2015-04-18 DIAGNOSIS — I10 Essential (primary) hypertension: Secondary | ICD-10-CM | POA: Diagnosis not present

## 2015-04-18 DIAGNOSIS — R269 Unspecified abnormalities of gait and mobility: Secondary | ICD-10-CM | POA: Diagnosis not present

## 2015-04-18 DIAGNOSIS — R509 Fever, unspecified: Secondary | ICD-10-CM | POA: Diagnosis not present

## 2015-04-18 DIAGNOSIS — Z7982 Long term (current) use of aspirin: Secondary | ICD-10-CM | POA: Diagnosis not present

## 2015-04-18 DIAGNOSIS — R531 Weakness: Secondary | ICD-10-CM | POA: Diagnosis not present

## 2015-04-18 DIAGNOSIS — Z79899 Other long term (current) drug therapy: Secondary | ICD-10-CM | POA: Diagnosis not present

## 2015-04-18 DIAGNOSIS — R4781 Slurred speech: Secondary | ICD-10-CM | POA: Diagnosis not present

## 2015-04-18 DIAGNOSIS — I6789 Other cerebrovascular disease: Secondary | ICD-10-CM | POA: Diagnosis not present

## 2015-04-18 DIAGNOSIS — R569 Unspecified convulsions: Secondary | ICD-10-CM | POA: Diagnosis not present

## 2015-04-18 DIAGNOSIS — Z5321 Procedure and treatment not carried out due to patient leaving prior to being seen by health care provider: Secondary | ICD-10-CM | POA: Diagnosis not present

## 2015-05-01 DIAGNOSIS — G40909 Epilepsy, unspecified, not intractable, without status epilepticus: Secondary | ICD-10-CM | POA: Diagnosis not present

## 2015-05-04 ENCOUNTER — Encounter: Payer: Self-pay | Admitting: Vascular Surgery

## 2015-05-11 ENCOUNTER — Ambulatory Visit (INDEPENDENT_AMBULATORY_CARE_PROVIDER_SITE_OTHER): Payer: Medicare Other | Admitting: Neurology

## 2015-05-11 ENCOUNTER — Encounter: Payer: Self-pay | Admitting: Neurology

## 2015-05-11 ENCOUNTER — Other Ambulatory Visit: Payer: Self-pay | Admitting: *Deleted

## 2015-05-11 VITALS — BP 120/70 | HR 74 | Ht 67.0 in | Wt 192.4 lb

## 2015-05-11 DIAGNOSIS — IMO0001 Reserved for inherently not codable concepts without codable children: Secondary | ICD-10-CM

## 2015-05-11 DIAGNOSIS — R410 Disorientation, unspecified: Secondary | ICD-10-CM | POA: Diagnosis not present

## 2015-05-11 DIAGNOSIS — I6523 Occlusion and stenosis of bilateral carotid arteries: Secondary | ICD-10-CM

## 2015-05-11 DIAGNOSIS — R251 Tremor, unspecified: Secondary | ICD-10-CM

## 2015-05-11 DIAGNOSIS — Z48812 Encounter for surgical aftercare following surgery on the circulatory system: Secondary | ICD-10-CM

## 2015-05-11 NOTE — Patient Instructions (Signed)
1. Schedule 48-hour EEG 2. Please have carotid ultrasound results faxed to our office as well 3. Continue all your medications 4. As per [place] driving laws, for any episode of loss of awareness, one should not drive until 6 months event-free 5. Follow-up after EEG

## 2015-05-11 NOTE — Progress Notes (Signed)
NEUROLOGY CONSULTATION NOTE  [name] MRN: [id number] DOB: 06/15/1934  Referring provider: Dr. Donzetta Sprung Primary care provider: Dr. Donzetta Sprung  Reason for consult:  Mental status change  Dear Dr Reuel Boom:  Thank you for your kind referral of [name] for consultation of the above symptoms. Although his history is well known to you, please allow me to reiterate it for the purpose of our medical record. The patient was accompanied to the clinic by his daughter who also provides collateral information. Records and images were personally reviewed where available.  HISTORY OF PRESENT ILLNESS: This is a pleasant 81 year old right-handed man with a history of hypertension, hyperlipidemia, diabetes, presenting with recurrent episodes of shaking followed by confusion. He was in his usual state of health until January 2015 when his family started noticing that he was having memory issues and staggering gait for several weeks. On 05/05/13, he was in the car on the way to his Neurosurgeon's office when he had a generalized tonic-clonic seizure and was brought to [facility]. He was initially combative then was noticed to stare straight and become unresponsive. He was transferred to [facility] where he was evaluated by Neurology and underwent extensive workup with MRI brain, EEG, and lumbar puncture. 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 There was advanced global atrophy with moderate chronic microvascular ischemic disease. Repeat MRI brain with and without contrast done 05/14/13 showed resolution of abnormal signal, compatible with PRES. EEG was normal, there was note of one sharp transient with phase reversal at C4, within  normal range. CSF studies were unremarkable except for protein of 68 and glucose of 112, which was felt to be due to diabetes. At that time, family also reported he had been tremulous for 6 months. He had been discharged home on Depakote  BID and Risperdal. He returned to baseline and was doing well until November 2016 when he started having tremors at a restaurant. He was able to talk and was completely coherent, walking to the car still with body shaking, laughing per family, this lasted more than an hour. However, once they go home, he had to be carried into the house, he was confused with urinary incontinence, and later on had no recollection of events after the restaurant. He had another episode on 03/30/15, his daughter brings a phone video of the episode, he is at the table with tremulousness of his arms and legs, talking coherently, laughing. He was holding both hands together trying to make the shaking stop. His daughter noticed the right side of his face looked droopy. This again lasted for over an hour, then he was very confused, could not answer questions, with gibberish speech for several hours. He was again incontinent of urine. His daughter reports she checked his blood glucose during these episodes and they were unremarkable. His family brought him to [facility] where he had an MRI and MRA brain which did not show any acute changes. He was transferred to [facility], where mental status gradually improved. Bloodwork showed a WBC of 13.9, otherwise unremarkable. Repeat EEG was normal. Etiology of symptoms was unclear, and he was discharged home back to baseline with no change in medications.   His daughter reports that he had a similar episode of "violent shaking" while in the recovery room from  left carotid endarterectomy in 2013, but he did not become confused after. He is now back to baseline, family does not notice any memory changes, it is "fair" per son, he cannot remember what he is trying to say.  His wife puts his medications in a pillbox and he remembers to take it. His wife is in charge of bill payments. He has not been driving since December 4540. He denies any alcohol use. Family denies any other staring/unresponsive episodes, he denies any olfactory/gustatory hallucinations, deja vu, rising epigastric sensation, focal numbness/tingling/weakness, myoclonic jerks. He reports being fully awake and aware of the shaking episodes, but is amnestic of the post-event confusion. His family continues to notice balance problems since 2015. He denies any headaches, dizziness, diplopia, dysarthria, dysphagia, neck pain, bowel/bladder dysfunction. He has chronic back pain. His daughter has a history of epilepsy, otherwise he had a normal birth and early development.  There is no history of febrile convulsions, CNS infections such as meningitis/encephalitis, significant traumatic brain injury, neurosurgical procedures.  PAST MEDICAL HISTORY: Past Medical History  Diagnosis Date  . Diabetes mellitus   . Hypertension   . Hyperlipidemia   . CAD (coronary artery disease)   . Leg pain     with walking  . Carotid artery occlusion   . CKD (chronic kidney disease), stage III   . Anxiety     PAST SURGICAL HISTORY: Past Surgical History  Procedure Laterality Date  . Back surgery  07/10/11  . Carotid endarterectomy  01/10/11    Left Carotid  . Coronary stent placement  08/09/2005    Right  . Spine surgery  07/10/11    MEDICATIONS: Current Outpatient Prescriptions on File Prior to Visit  Medication Sig Dispense Refill  . aspirin EC 325 MG EC tablet Take 1 tablet (325 mg total) by mouth daily. 30 tablet 0  . carvedilol (COREG) 12.5 MG tablet Take 1 tablet (12.5 mg total) by mouth 2 (two) times daily with a meal. 60 tablet 0  . clonazePAM (KLONOPIN) 1 MG tablet Take 2 mg by mouth at bedtime.   1  . gabapentin (NEURONTIN) 300 MG capsule Take 600 mg by mouth 2 (two) times daily.  1  . glipiZIDE  (GLUCOTROL) 5 MG tablet Take 5 mg by mouth 2 (two) times daily.  0  . metFORMIN (GLUCOPHAGE-XR) 500 MG 24 hr tablet Take 1,000 mg by mouth 2 (two) times daily.  0  . omeprazole (PRILOSEC) 20 MG capsule Take 20 mg by mouth daily.  3  . simvastatin (ZOCOR) 80 MG tablet Take 40 mg by mouth at bedtime.     . traZODone (DESYREL) 50 MG tablet Take 50 mg by mouth at bedtime.      No current facility-administered medications on file prior to visit.    ALLERGIES: Allergies  Allergen Reactions  . Omnipaque [Iohexol]   . Paxil [Paroxetine Hcl]     Unknown reaction, on file with [facility]  . Topamax [Topiramate]     Unknown reaction, on file with [facility]  . Codeine Nausea And Vomiting    FAMILY HISTORY: Family History  Problem Relation Age of Onset  . Hypertension Mother   . Deep vein thrombosis Mother   . Diabetes Mother   . Heart disease Mother   . COPD Father   . Heart disease Sister   . Heart attack Sister     Bypass  . Heart disease Brother     Heart Disease before age 76  . Heart disease Brother   .  Hypertension Son     SOCIAL HISTORY: Social History   Social History  . Marital Status: Married    Spouse Name: N/A  . Number of Children: N/A  . Years of Education: N/A   Occupational History  . Not on file.   Social History Main Topics  . Smoking status: Former Smoker    Types: Cigarettes    Quit date: 04/17/1981  . Smokeless tobacco: Never Used  . Alcohol Use: No  . Drug Use: No  . Sexual Activity: Not on file   Other Topics Concern  . Not on file   Social History Narrative   Lives with wife in a one story home.  Has 4 children.  Retired from [employer].  Education: high school.    REVIEW OF SYSTEMS: Constitutional: No fevers, chills, or sweats, no generalized fatigue, change in appetite Eyes: No visual changes, double vision, eye pain Ear, nose and throat: No hearing loss, ear pain, nasal congestion, sore throat Cardiovascular: No chest pain,  palpitations Respiratory:  No shortness of breath at rest or with exertion, wheezes GastrointestinaI: No nausea, vomiting, diarrhea, abdominal pain, fecal incontinence Genitourinary:  No dysuria, urinary retention or frequency Musculoskeletal:  No neck pain, +back pain Integumentary: No rash, pruritus, skin lesions Neurological: as above Psychiatric: No depression, insomnia, anxiety Endocrine: No palpitations, fatigue, diaphoresis, mood swings, change in appetite, change in weight, increased thirst Hematologic/Lymphatic:  No anemia, purpura, petechiae. Allergic/Immunologic: no itchy/runny eyes, nasal congestion, recent allergic reactions, rashes  PHYSICAL EXAM: Filed Vitals:   05/11/15 0852  BP: 120/70  Pulse: 74   General: No acute distress Head:  Normocephalic/atraumatic Eyes: Fundoscopic exam shows bilateral sharp discs, no vessel changes, exudates, or hemorrhages Neck: supple, no paraspinal tenderness, full range of motion Back: No paraspinal tenderness Heart: regular rate and rhythm Lungs: Clear to auscultation bilaterally. Vascular: No carotid bruits. Skin/Extremities: No rash, no edema Neurological Exam: Mental status: alert and oriented to person, place, and time, no dysarthria or aphasia, Fund of knowledge is appropriate.  Recent and remote memory are intact.  Attention and concentration are normal.    Able to name objects and repeat phrases. CDT 4/5 MMSE - Mini Mental State Exam 05/11/2015  Orientation to time 3  Orientation to Place 5  Registration 3  Attention/ Calculation 5  Recall 3  Language- name 2 objects 2  Language- repeat 1  Language- follow 3 step command 3  Language- read & follow direction 1  Write a sentence 1  Copy design 1  Total score 28   Cranial nerves: CN I: not tested CN II: pupils equal, round and reactive to light, visual fields intact, fundi unremarkable. CN III, IV, VI:  full range of motion, no nystagmus, no ptosis CN V: facial  sensation intact CN VII: upper and lower face symmetric CN VIII: hearing intact to finger rub CN IX, X: gag intact, uvula midline CN XI: sternocleidomastoid and trapezius muscles intact CN XII: tongue midline Bulk & Tone: normal, no fasciculations. Motor: 5/5 throughout with no pronator drift. Sensation: decreased vibration to right ankle, otherwise intact to light touch, cold, pin, and joint position sense.  No extinction to double simultaneous stimulation.  Romberg test negative Deep Tendon Reflexes: +2 on left UE, otherwise +1 throughout, no ankle clonus Plantar responses: downgoing bilaterally Cerebellar: no incoordination on finger to nose, heel to shin. No dysdiadochokinesia Gait: narrow-based and steady, able to tandem walk adequately. Tremor: none in office today  IMPRESSION: This is a pleasant 80 year  old right-handed man with a history of hypertension, hyperlipidemia, diabetes, left CEA, presenting for recurrent prolonged shaking episodes without impaired awareness, however followed by confusion and gibberish speech that he is amnestic of after. He had a GTC in December 2015 in the setting of PRES, his daughter reports the tremulousness recently is different. Last episode was last month. MRI brain and routine EEG normal. The etiology of his symptoms is unclear, the prolonged duration and intact cognition during shaking spells is atypical for seizures, however the prolonged confusion after is concerning. Limb-shaking TIAs can be seen with carotid occlusive disease, however the shaking spells he had are not unilateral. He is seeing his vascular surgeon tomorrow to evaluate carotids. A 48-hour EEG will be ordered to further classify his symptoms. No medication changes for now. He is taking gabapentin 600mg  BID for back pain. [place] driving laws were discussed with the patient, and he knows to stop driving after an episode of loss of awareness, until 6 months event-free. He will follow-up after the  EEG.   Thank you for allowing me to participate in the care of this patient. Please do not hesitate to call for any questions or concerns.   Patrcia Dolly, M.D.  CC: Dr. Reuel Boom

## 2015-05-12 ENCOUNTER — Ambulatory Visit (HOSPITAL_COMMUNITY)
Admission: RE | Admit: 2015-05-12 | Discharge: 2015-05-12 | Disposition: A | Discharge status: Home / Self Care | Payer: Medicare Other | Source: Ambulatory Visit | Attending: Vascular Surgery | Admitting: Vascular Surgery

## 2015-05-12 ENCOUNTER — Encounter: Payer: Self-pay | Admitting: Vascular Surgery

## 2015-05-12 ENCOUNTER — Ambulatory Visit (INDEPENDENT_AMBULATORY_CARE_PROVIDER_SITE_OTHER): Payer: Medicare Other | Admitting: Vascular Surgery

## 2015-05-12 VITALS — BP 133/78 | HR 68 | Ht 67.0 in | Wt 191.0 lb

## 2015-05-12 DIAGNOSIS — E1122 Type 2 diabetes mellitus with diabetic chronic kidney disease: Secondary | ICD-10-CM | POA: Diagnosis not present

## 2015-05-12 DIAGNOSIS — Z48812 Encounter for surgical aftercare following surgery on the circulatory system: Secondary | ICD-10-CM | POA: Diagnosis not present

## 2015-05-12 DIAGNOSIS — I6523 Occlusion and stenosis of bilateral carotid arteries: Secondary | ICD-10-CM | POA: Diagnosis not present

## 2015-05-12 DIAGNOSIS — I129 Hypertensive chronic kidney disease with stage 1 through stage 4 chronic kidney disease, or unspecified chronic kidney disease: Secondary | ICD-10-CM | POA: Diagnosis not present

## 2015-05-12 DIAGNOSIS — N183 Chronic kidney disease, stage 3 (moderate): Secondary | ICD-10-CM | POA: Insufficient documentation

## 2015-05-12 DIAGNOSIS — E785 Hyperlipidemia, unspecified: Secondary | ICD-10-CM | POA: Diagnosis not present

## 2015-05-12 DIAGNOSIS — I6521 Occlusion and stenosis of right carotid artery: Secondary | ICD-10-CM | POA: Insufficient documentation

## 2015-05-12 NOTE — Progress Notes (Signed)
[facility]  Patient name: [name] MRN: [id number] DOB: 1935-04-02 Sex: male  REASON FOR VISIT: Follow up of carotid disease  HPI: English [name] is a 81 y.o. male who underwent a left carotid endarterectomy in September 2012. He comes in for routine follow up visit. He denies any history of stroke, TIAs, expressive or receptive aphasia, or amaurosis fugax. He has had 2 episodes where he was shaking but had no focal weakness or paresthesias. He has been evaluated by neurology and it was not felt that these were seizures. His wife actually showed me a video of the most recent episode which occurred in December.  He is on aspirin and is on a statin.  Past Medical History  Diagnosis Date  . Diabetes mellitus   . Hypertension   . Hyperlipidemia   . CAD (coronary artery disease)   . Leg pain     with walking  . Carotid artery occlusion   . CKD (chronic kidney disease), stage III   . Anxiety     Family History  Problem Relation Age of Onset  . Hypertension Mother   . Deep vein thrombosis Mother   . Diabetes Mother   . Heart disease Mother   . COPD Father   . Heart disease Sister   . Heart attack Sister     Bypass  . Heart disease Brother     Heart Disease before age 62  . Heart disease Brother   . Hypertension Son     SOCIAL HISTORY: Social History  Substance Use Topics  . Smoking status: Former Smoker    Types: Cigarettes    Quit date: 04/17/1981  . Smokeless tobacco: Never Used  . Alcohol Use: No    Allergies  Allergen Reactions  . Omnipaque [Iohexol]   . Paxil [Paroxetine Hcl]     Unknown reaction, on file with [facility]  . Topamax [Topiramate]     Unknown reaction, on file with [facility]  . Codeine Nausea And Vomiting    Current Outpatient Prescriptions  Medication Sig Dispense Refill  . aspirin EC 325 MG EC tablet Take 1 tablet (325 mg total) by mouth daily. 30 tablet 0  . carvedilol (COREG) 12.5 MG tablet Take 1  tablet (12.5 mg total) by mouth 2 (two) times daily with a meal. 60 tablet 0  . clonazePAM (KLONOPIN) 1 MG tablet Take 2 mg by mouth at bedtime.   1  . gabapentin (NEURONTIN) 300 MG capsule Take 600 mg by mouth 2 (two) times daily.  1  . glipiZIDE (GLUCOTROL) 5 MG tablet Take 5 mg by mouth 2 (two) times daily.  0  . metFORMIN (GLUCOPHAGE-XR) 500 MG 24 hr tablet Take 1,000 mg by mouth 2 (two) times daily.  0  . omeprazole (PRILOSEC) 20 MG capsule Take 20 mg by mouth daily.  3  . simvastatin (ZOCOR) 80 MG tablet Take 40 mg by mouth at bedtime.     . traZODone (DESYREL) 50 MG tablet Take 50 mg by mouth at bedtime.      No current facility-administered medications for this visit.    REVIEW OF SYSTEMS:   denotes positive finding,  denotes negative finding Cardiac  Comments:  Chest pain or chest pressure:    Shortness of breath upon exertion:    Short of breath when lying flat:    Irregular heart rhythm:        Vascular    Pain in calf, thigh, or hip brought  on by ambulation:    Pain in feet at night that wakes you up from your sleep:     Blood clot in your veins:    Leg swelling:         Pulmonary    Oxygen at home:    Productive cough:     Wheezing:         Neurologic    Sudden weakness in arms or legs:     Sudden numbness in arms or legs:     Sudden onset of difficulty speaking or slurred speech:    Temporary loss of vision in one eye:     Problems with dizziness:         Gastrointestinal    Blood in stool:     Vomited blood:         Genitourinary    Burning when urinating:     Blood in urine:        Psychiatric    Major depression:         Hematologic    Bleeding problems:    Problems with blood clotting too easily:        Skin    Rashes or ulcers:        Constitutional    Fever or chills:      PHYSICAL EXAM: Filed Vitals:   05/12/15 1357 05/12/15 1359  BP: 132/74 133/78  Pulse: 68   Height:  (1.702 m)   Weight: 191 lb (86.637 kg)   SpO2:  94%     GENERAL: The patient is a well-nourished male, in no acute distress. The vital signs are documented above. CARDIAC: There is a regular rate and rhythm.  VASCULAR: do not detect carotid bruits. PULMONARY: There is good air exchange bilaterally without wheezing or rales. ABDOMEN: Soft and non-tender with normal pitched bowel sounds.  MUSCULOSKELETAL: There are no major deformities or cyanosis. NEUROLOGIC: No focal weakness or paresthesias are detected. SKIN: There are no ulcers or rashes noted. PSYCHIATRIC: The patient has a normal affect.  DATA:   CAROTID DUPLEX: I have independently interpreted his carotid duplex scan. On the left side the left carotid endarterectomy site is widely patent without evidence of restenosis. There is a less than 40% right carotid stenosis. The right vertebral artery is occluded. The left vertebral artery is patent with antegrade flow.  MEDICAL ISSUES:  CAROTID DISEASE:  The patient has no evidence of recurrent carotid stenosis on the left and has no significant right carotid stenosis. He is on aspirin. He is on a statin. He is not a smoker. I have encouraged him to stay as active as possible. I have ordered a follow up carotid duplex scan in 2 years and I'll see him back at that time. He knows to call sooner if he has problems.   Waverly Ferrari [facility] [place] Beeper: [phone]

## 2015-05-17 DIAGNOSIS — D500 Iron deficiency anemia secondary to blood loss (chronic): Secondary | ICD-10-CM | POA: Diagnosis not present

## 2015-05-17 DIAGNOSIS — E782 Mixed hyperlipidemia: Secondary | ICD-10-CM | POA: Diagnosis not present

## 2015-05-17 DIAGNOSIS — E1122 Type 2 diabetes mellitus with diabetic chronic kidney disease: Secondary | ICD-10-CM | POA: Diagnosis not present

## 2015-05-17 DIAGNOSIS — R410 Disorientation, unspecified: Secondary | ICD-10-CM | POA: Insufficient documentation

## 2015-05-17 DIAGNOSIS — IMO0001 Reserved for inherently not codable concepts without codable children: Secondary | ICD-10-CM | POA: Insufficient documentation

## 2015-05-19 ENCOUNTER — Encounter: Payer: Self-pay | Admitting: Neurology

## 2015-05-24 ENCOUNTER — Ambulatory Visit (INDEPENDENT_AMBULATORY_CARE_PROVIDER_SITE_OTHER): Payer: Medicare Other | Admitting: Neurology

## 2015-05-24 DIAGNOSIS — R251 Tremor, unspecified: Secondary | ICD-10-CM

## 2015-05-24 DIAGNOSIS — IMO0001 Reserved for inherently not codable concepts without codable children: Secondary | ICD-10-CM

## 2015-05-24 DIAGNOSIS — R410 Disorientation, unspecified: Secondary | ICD-10-CM

## 2015-06-02 ENCOUNTER — Encounter: Payer: Self-pay | Admitting: Neurology

## 2015-06-02 ENCOUNTER — Ambulatory Visit (INDEPENDENT_AMBULATORY_CARE_PROVIDER_SITE_OTHER): Payer: Medicare Other | Admitting: Neurology

## 2015-06-02 VITALS — BP 124/70 | HR 90 | Ht 67.0 in | Wt 187.0 lb

## 2015-06-02 DIAGNOSIS — R410 Disorientation, unspecified: Secondary | ICD-10-CM | POA: Diagnosis not present

## 2015-06-02 DIAGNOSIS — IMO0001 Reserved for inherently not codable concepts without codable children: Secondary | ICD-10-CM

## 2015-06-02 DIAGNOSIS — R251 Tremor, unspecified: Secondary | ICD-10-CM | POA: Diagnosis not present

## 2015-06-02 MED ORDER — GABAPENTIN 300 MG PO CAPS: ORAL_CAPSULE | ORAL | Status: AC

## 2015-06-02 NOTE — Progress Notes (Signed)
NEUROLOGY FOLLOW UP OFFICE NOTE  [name] [id number]  HISTORY OF PRESENT ILLNESS: I had the pleasure of seeing [name] in follow-up in the neurology clinic on 06/02/2015. The patient was last seen 3 weeks ago for recurrent episodes of shaking followed by confusion. He is again accompanied by [name] who helps supplement the history today. Records and images were personally reviewed where available. [name] 48-hour EEG did not show any epileptiform discharges, there was occasional focal slowing over the left temporal region, at times sharply contoured without clear epileptogenic potential. Typical events were not captured. Recent carotid dopplers per Dr. Edilia Bo showed left carotid endarterectomy site is widely patent, less than 40% right carotid stenosis, right vertebral artery is occluded, left vertebral artery is patent. He and [name] deny any similar shaking episodes since December 2016. He reports feeling well, no headaches, dizziness, diplopia, focal numbness/tingling/weakness. No falls.   HPI: This is a pleasant 81 yo RH man with a history of hypertension, hyperlipidemia, diabetes, with recurrent episodes of shaking followed by confusion. He was in [name] usual state of health until January 2015 when [name] family started noticing that he was having memory issues and staggering gait for several weeks. On 05/05/13, he was in the car on the way to [name] Neurosurgeon's office when he had a generalized tonic-clonic seizure and was brought to [name]. He was initially combative then was noticed to stare straight and become unresponsive. He was transferred to [name] where he was evaluated by Neurology and underwent extensive workup with MRI brain, EEG, and lumbar puncture. I personally reviewed MRI brain without contrast on 05/09/13 which showed curvilinear foci of hyperintense FLAIR signal involving the cortical gray matter and subcortical white matter of the posterior or medial  parietal lobes bilaterally, suggestive of posterior reversible encephalopathy syndrome (PRES). There was advanced global atrophy with moderate chronic microvascular ischemic disease. Repeat MRI brain with and without contrast done 05/14/13 showed resolution of abnormal signal, compatible with PRES. EEG was normal, there was note of one sharp transient with phase reversal at C4, within normal range. CSF studies were unremarkable except for protein of 68 and glucose of 112, which was felt to be due to diabetes. At that time, family also reported he had been tremulous for 6 months. He had been discharged home on Depakote  BID and Risperdal. He returned to baseline and was doing well until November 2016 when he started having tremors at a restaurant. He was able to talk and was completely coherent, walking to the car still with body shaking, laughing per family, this lasted more than an hour. However, once they go home, he had to be carried into the house, he was confused with urinary incontinence, and later on had no recollection of events after the restaurant. He had another episode on 03/30/15, [name] brings a phone video of the episode, he is at the table with tremulousness of [name] arms and legs, talking coherently, laughing. He was holding both hands together trying to make the shaking stop. [name] noticed the right side of [name] face looked droopy. This again lasted for over an hour, then he was very confused, could not answer questions, with gibberish speech for several hours. He was again incontinent of urine. [name] reports she checked [name] blood glucose during these episodes and they were unremarkable. [name] family brought him to [name] where he had an MRI and MRA brain which did not show any acute changes. He was transferred to [facility],  where mental status gradually improved. Bloodwork showed a WBC of 13.9, otherwise unremarkable. Repeat EEG was normal. Etiology of symptoms was unclear, and he  was discharged home back to baseline with no change in medications.   [name] reports that he had a similar episode of "violent shaking" while in the recovery room from left carotid endarterectomy in 2013, but he did not become confused after. He is now back to baseline, family does not notice any memory changes, it is "fair" per [name], he cannot remember what he is trying to say. [name] puts [name] medications in a pillbox and he remembers to take it. [name] is in charge of bill payments. He has not been driving since December 6295. He denies any alcohol use. Family denies any other staring/unresponsive episodes, he denies any olfactory/gustatory hallucinations, deja vu, rising epigastric sensation, focal numbness/tingling/weakness, myoclonic jerks. He reports being fully awake and aware of the shaking episodes, but is amnestic of the post-event confusion. [name] family continues to notice balance problems since 2015. [name] has a history of epilepsy, otherwise he had a normal birth and early development. There is no history of febrile convulsions, CNS infections such as meningitis/encephalitis, significant traumatic brain injury, neurosurgical procedures.  PAST MEDICAL HISTORY: Past Medical History  Diagnosis Date  . Diabetes mellitus   . Hypertension   . Hyperlipidemia   . CAD (coronary artery disease)   . Leg pain     with walking  . Carotid artery occlusion   . CKD (chronic kidney disease), stage III   . Anxiety     MEDICATIONS: Current Outpatient Prescriptions on File Prior to Visit  Medication Sig Dispense Refill  . aspirin EC 325 MG EC tablet Take 1 tablet (325 mg total) by mouth daily. 30 tablet 0  . carvedilol (COREG) 12.5 MG tablet Take 1 tablet (12.5 mg total) by mouth 2 (two) times daily with a meal. 60 tablet 0  . clonazePAM (KLONOPIN) 1 MG tablet Take 2 mg by mouth at bedtime.   1  . gabapentin (NEURONTIN) 300 MG capsule Take 600 mg by mouth 2 (two) times daily.  1  .  glipiZIDE (GLUCOTROL) 5 MG tablet Take 5 mg by mouth 2 (two) times daily.  0  . metFORMIN (GLUCOPHAGE-XR) 500 MG 24 hr tablet Take 1,000 mg by mouth 2 (two) times daily.  0  . omeprazole (PRILOSEC) 20 MG capsule Take 20 mg by mouth daily.  3  . simvastatin (ZOCOR) 80 MG tablet Take 40 mg by mouth at bedtime.     . traZODone (DESYREL) 50 MG tablet Take 50 mg by mouth at bedtime.      No current facility-administered medications on file prior to visit.    ALLERGIES: Allergies  Allergen Reactions  . Omnipaque [Iohexol]   . Paxil [Paroxetine Hcl]     Unknown reaction, on file with [facility]  . Topamax [Topiramate]     Unknown reaction, on file with [facility]  . Codeine Nausea And Vomiting    FAMILY HISTORY: Family History  Problem Relation Age of Onset  . Hypertension Mother   . Deep vein thrombosis Mother   . Diabetes Mother   . Heart disease Mother   . COPD Father   . Heart disease Sister   . Heart attack Sister     Bypass  . Heart disease Brother     Heart Disease before age 82  . Heart disease Brother   . Hypertension [name]     SOCIAL  HISTORY: Social History   Social History  . Marital Status: Married    Spouse Name: N/A  . Number of Children: N/A  . Years of Education: N/A   Occupational History  . Not on file.   Social History Main Topics  . Smoking status: Former Smoker    Types: Cigarettes    Quit date: 04/17/1981  . Smokeless tobacco: Never Used  . Alcohol Use: No  . Drug Use: No  . Sexual Activity: Not on file   Other Topics Concern  . Not on file   Social History Narrative   Lives with [name] in a one story home.  Has 4 children.  Retired from [employer].  Education: high school.    REVIEW OF SYSTEMS: Constitutional: No fevers, chills, or sweats, no generalized fatigue, change in appetite Eyes: No visual changes, double vision, eye pain Ear, nose and throat: No hearing loss, ear pain, nasal congestion, sore throat Cardiovascular: No chest  pain, palpitations Respiratory:  No shortness of breath at rest or with exertion, wheezes GastrointestinaI: No nausea, vomiting, diarrhea, abdominal pain, fecal incontinence Genitourinary:  No dysuria, urinary retention or frequency Musculoskeletal:  No neck pain,+ back pain Integumentary: No rash, pruritus, skin lesions Neurological: as above Psychiatric: No depression, insomnia, anxiety Endocrine: No palpitations, fatigue, diaphoresis, mood swings, change in appetite, change in weight, increased thirst Hematologic/Lymphatic:  No anemia, purpura, petechiae. Allergic/Immunologic: no itchy/runny eyes, nasal congestion, recent allergic reactions, rashes  PHYSICAL EXAM: Filed Vitals:   06/02/15 1539  BP: 124/70  Pulse: 90   General: No acute distress Head:  Normocephalic/atraumatic Neck: supple, no paraspinal tenderness, full range of motion Heart:  Regular rate and rhythm Lungs:  Clear to auscultation bilaterally Back: No paraspinal tenderness Skin/Extremities: No rash, no edema Neurological Exam: alert and oriented to person, place, and time. No aphasia or dysarthria. Fund of knowledge is appropriate.  Recent and remote memory are intact.  Attention and concentration are normal.    Able to name objects and repeat phrases. Cranial nerves: Pupils equal, round, reactive to light. Extraocular movements intact with no nystagmus. Visual fields full. Facial sensation intact. No facial asymmetry. Tongue, uvula, palate midline.  Motor: Bulk and tone normal, muscle strength 5/5 throughout with no pronator drift.  Sensation to light touch intact.  No extinction to double simultaneous stimulation.  Deep tendon reflexes +1 throughout, toes downgoing.  Finger to nose testing intact.  Gait narrow-based and steady, able to tandem walk adequately.  Romberg negative.  IMPRESSION: This is a pleasant 81 yo RH man with a history of hypertension, hyperlipidemia, diabetes, left CEA, with recurrent prolonged  shaking episodes without impaired awareness, however followed by confusion and gibberish speech that he is amnestic of after. He had a GTC in December 2015 in the setting of PRES, [name] reports the tremulousness recently is different. Last shaking spell was in December 2016. MRI brain normal, [name] 48-hour EEG did not show any epileptiform discharges, there was occasional focal slowing over the left temporal region. Typical events were not captured. We again discussed that the etiology of [name] symptoms is unclear, the prolonged duration and intact cognition during shaking spells is atypical for seizures, however the prolonged confusion after is concerning. He is taking gabapentin for back pain. We discussed that this is also an anti-epileptic medication, he will increase dose to  BID and will continue to monitor if symptoms recur on higher dose. He may take an additional dose of clonazepam during the spells. We again discussed  [place] driving laws to stop driving after an episode of loss of awareness, until 6 months event-free. He will follow-up in 2 months and knows to call for any changes.   Thank you for allowing me to participate in [name] care.  Please do not hesitate to call for any questions or concerns.  The duration of this appointment visit was 25 minutes of face-to-face time with the patient.  Greater than 50% of this time was spent in counseling, explanation of diagnosis, planning of further management, and coordination of care.   Patrcia Dolly, M.D.   CC: Dr. Reuel Boom, Dr. Edilia Bo

## 2015-06-02 NOTE — Patient Instructions (Signed)
1. Increase Gabapentin : Take 2 caps in AM, 3 caps in PM for 1 week ,then increase to 3 caps twice a day 2. May take additional tablet of clonazepam when you start having the shaking spells 3. As per [place] driving laws, no driving until 6 months event-free 4. Follow-up in 2 months, call for any changes

## 2015-06-28 NOTE — Procedures (Signed)
ELECTROENCEPHALOGRAM REPORT  Dates of Recording: 05/24/2015 to 05/26/2015  Patient's Name: [name] MRN: [id number] Date of Birth: [birth year]  Referring Provider: Dr. Patrcia DollyKaren Aquino  Procedure: 48-hour ambulatory EEG  History: This is a 81 year old man with recurrent episodes of prolonged shaking without impaired awareness but followed by confusion and gibberish speech after the events. He had a GTC in December 2015 in the setting of PRES.  Medications: Neurontin, ASA,Coreg, Klonopin, Glucotrol, Glucophage, Prilosec, Zocor, Desyrel  Technical Summary: This is a 48-hour multichannel digital EEG recording measured by the international 10-20 system with electrodes applied with paste and impedances below 5000 ohms performed as portable with EKG monitoring.  The digital EEG was referentially recorded, reformatted, and digitally filtered in a variety of bipolar and referential montages for optimal display.    DESCRIPTION OF RECORDING: During maximal wakefulness, the background activity consisted of a symmetric 10-10.5 Hz posterior dominant rhythm which was reactive to eye opening. There is occasional focal 4-5 Hz theta slowing seen over the left temporal region, at times sharply contoured without clear epileptogenic potential. There were no epileptiform discharges seen in wakefulness.  During the recording, the patient progresses through wakefulness, drowsiness, and Stage 2 sleep. Similar occasional focal theta slowing is seen over the left temporal region.  Again, there were no epileptiform discharges seen.  Events: There were no push button events. Patient did not report typical symptoms.  There were no electrographic seizures seen.  EKG lead was unremarkable.  IMPRESSION: This 48-hour ambulatory EEG study is abnormal due to occasional focal slowing over the left temporal region.  CLINICAL CORRELATION of the above findings indicates focal cerebral dysfunction over the left temporal  region suggestive of underlying structural or physiologic abnormality in this region. The absence of epileptiform discharges does not exclude a clinical diagnosis of epilepsy. Typical shaking spells were not captured. If further clinical questions remain, inpatient video EEG monitoring may be helpful.   Patrcia DollyKaren Aquino, M.D.

## 2015-07-01 DIAGNOSIS — E1122 Type 2 diabetes mellitus with diabetic chronic kidney disease: Secondary | ICD-10-CM | POA: Diagnosis not present

## 2015-07-01 DIAGNOSIS — E1142 Type 2 diabetes mellitus with diabetic polyneuropathy: Secondary | ICD-10-CM | POA: Diagnosis not present

## 2015-07-01 DIAGNOSIS — E782 Mixed hyperlipidemia: Secondary | ICD-10-CM | POA: Diagnosis not present

## 2015-07-01 DIAGNOSIS — G6289 Other specified polyneuropathies: Secondary | ICD-10-CM | POA: Diagnosis not present

## 2015-08-02 ENCOUNTER — Ambulatory Visit (INDEPENDENT_AMBULATORY_CARE_PROVIDER_SITE_OTHER): Payer: Medicare Other | Admitting: Neurology

## 2015-08-02 ENCOUNTER — Encounter: Payer: Self-pay | Admitting: Neurology

## 2015-08-02 VITALS — BP 128/68 | HR 64 | Ht 67.0 in | Wt 191.0 lb

## 2015-08-02 DIAGNOSIS — R251 Tremor, unspecified: Secondary | ICD-10-CM | POA: Diagnosis not present

## 2015-08-02 DIAGNOSIS — R410 Disorientation, unspecified: Secondary | ICD-10-CM

## 2015-08-02 DIAGNOSIS — IMO0001 Reserved for inherently not codable concepts without codable children: Secondary | ICD-10-CM

## 2015-08-02 NOTE — Patient Instructions (Signed)
1. Continue Gabapentin 900mg  twice a day 2. Take additional clonazepam as needed if another shaking episode occurs 3. Keep a calendar of your symptoms 4. As per  driving laws, for any episode of loss of awareness/confusion, one should not drive until 6 months event-free 5. Follow-up in 5 months, call for any changes

## 2015-08-02 NOTE — Progress Notes (Signed)
NEUROLOGY FOLLOW UP [name] NOTE  [name] [id number]  HISTORY OF PRESENT ILLNESS: I had [name] pleasure of seeing [name] in follow-up in [name] neurology clinic on 08/02/2015. [name] patient was last seen 2 months ago for recurrent episodes of shaking followed by confusion. He is again accompanied by his daughter who helps supplement [name] history today. His 48-hour EEG did not show any epileptiform discharges, there was occasional focal slowing over [name] left temporal region, at times sharply contoured without clear epileptogenic potential. Typical events were not captured. Dose of gabapentin was increased to 900mg  BID. He denies any further similar shaking episodes since December 2016. He reports doing very well, no side effects on gabapentin. He denies any headaches, dizziness, diplopia, focal numbness/tingling/weakness. He feels a little unsteady with his balance but denies any falls.    HPI: This is a pleasant 81 yo RH man with a history of hypertension, hyperlipidemia, diabetes, with recurrent episodes of shaking followed by confusion. He was in his usual state of [name] until January 2015 when his family started noticing that he was having memory issues and staggering gait for several weeks. On 05/05/13, he was in [name] car on [name] way to his Neurosurgeon's [name] when he had a generalized tonic-clonic seizure and was brought to [name]. He was initially combative then was noticed to stare straight and become unresponsive. He was transferred to [name] where he was evaluated by Neurology and underwent extensive workup with MRI brain, EEG, and lumbar puncture. I personally reviewed MRI brain without contrast on 05/09/13 which showed curvilinear foci of hyperintense FLAIR signal involving [name] cortical gray matter and subcortical white matter of [name] posterior or medial parietal lobes bilaterally, suggestive of posterior reversible encephalopathy syndrome (PRES). There was advanced global  atrophy with moderate chronic microvascular ischemic disease. Repeat MRI brain with and without contrast done 05/14/13 showed resolution of abnormal signal, compatible with PRES. EEG was normal, there was note of one sharp transient with phase reversal at C4, within normal range. CSF studies were unremarkable except for protein of 68 and glucose of 112, which was felt to be due to diabetes. At that time, family also reported he had been tremulous for 6 months. He had been discharged home on Depakote 250mg  BID and Risperdal. He returned to baseline and was doing well until November 2016 when he started having tremors at a restaurant. He was able to talk and was completely coherent, walking to [name] car still with body shaking, laughing per family, this lasted more than an hour. However, once they go home, he had to be carried into [name] house, he was confused with urinary incontinence, and later on had no recollection of events after [name] restaurant. He had another episode on 03/30/15, his daughter brings a phone video of [name] episode, he is at [name] table with tremulousness of his arms and legs, talking coherently, laughing. He was holding both hands together trying to make [name] shaking stop. His daughter noticed [name] right side of his face looked droopy. This again lasted for over an hour, then he was very confused, could not answer questions, with gibberish speech for several hours. He was again incontinent of urine. His daughter reports she checked his blood glucose during these episodes and they were unremarkable. His family brought him to [name] where he had an MRI and MRA brain which did not show any acute changes. He was transferred to [name], where mental status gradually improved. Bloodwork showed a WBC of 13.9, otherwise unremarkable.  Repeat EEG was normal. Etiology of symptoms was unclear, and he was discharged home back to baseline with no change in medications.   His daughter reports that he had a similar episode  of "violent shaking" while in [name] recovery room from left carotid endarterectomy in 2013, but he did not become confused after. He is now back to baseline, family does not notice any memory changes, it is "fair" per son, he cannot remember what he is trying to say. His wife puts his medications in a pillbox and he remembers to take it. His wife is in charge of bill payments. He has not been driving since December [id number]. He denies any alcohol use. Family denies any other staring/unresponsive episodes, he denies any olfactory/gustatory hallucinations, deja vu, rising epigastric sensation, focal numbness/tingling/weakness, myoclonic jerks. He reports being fully awake and aware of [name] shaking episodes, but is amnestic of [name] post-event confusion. His family continues to notice balance problems since 2015. His daughter has a history of epilepsy, otherwise he had a normal birth and early development. There is no history of febrile convulsions, CNS infections such as meningitis/encephalitis, significant traumatic brain injury, neurosurgical procedures.  Diagnostic Data: MRI brain with and without contrast at [name] no acute changes. His 48-hour EEG did not show any epileptiform discharges, there was occasional focal slowing over [name] left temporal region, at times sharply contoured without clear epileptogenic potential. Typical events were not captured. Recent carotid dopplers per Dr. Edilia Boickson showed left carotid endarterectomy site is widely patent, less than 40% right carotid stenosis, right vertebral artery is occluded, left vertebral artery is patent.  PAST MEDICAL HISTORY: Past Medical History  Diagnosis Date  . Diabetes mellitus   . Hypertension   . Hyperlipidemia   . CAD (coronary artery disease)   . Leg pain     with walking  . Carotid artery occlusion   . CKD (chronic kidney disease), stage III   . Anxiety     MEDICATIONS: Current Outpatient Prescriptions on File Prior to Visit    Medication Sig Dispense Refill  . aspirin EC 325 MG EC tablet Take 1 tablet (325 mg total) by mouth daily. 30 tablet 0  . carvedilol (COREG) 12.5 MG tablet Take 1 tablet (12.5 mg total) by mouth 2 (two) times daily with a meal. 60 tablet 0  . clonazePAM (KLONOPIN) 1 MG tablet Take 2 mg by mouth at bedtime.   1  . gabapentin (NEURONTIN) 300 MG capsule Take 3 tablets twice a day 540 capsule 3  . glipiZIDE (GLUCOTROL) 5 MG tablet Take 5 mg by mouth 2 (two) times daily.  0  . metFORMIN (GLUCOPHAGE-XR) 500 MG 24 hr tablet Take 1,000 mg by mouth 2 (two) times daily.  0  . omeprazole (PRILOSEC) 20 MG capsule Take 20 mg by mouth daily.  3  . simvastatin (ZOCOR) 80 MG tablet Take 40 mg by mouth at bedtime.     . traZODone (DESYREL) 50 MG tablet Take 50 mg by mouth at bedtime.      No current facility-administered medications on file prior to visit.    ALLERGIES: Allergies  Allergen Reactions  . Omnipaque [Iohexol]   . Paxil [Paroxetine Hcl]     Unknown reaction, on file with [facility]  . Topamax [Topiramate]     Unknown reaction, on file with [facility]  . Codeine Nausea And Vomiting    FAMILY HISTORY: Family History  Problem Relation Age of Onset  . Hypertension Mother   . Deep vein thrombosis  Mother   . Diabetes Mother   . Heart disease Mother   . COPD Father   . Heart disease Sister   . Heart attack Sister     Bypass  . Heart disease Brother     Heart Disease before age 74  . Heart disease Brother   . Hypertension Son     SOCIAL HISTORY: Social History   Social History  . Marital Status: Married    Spouse Name: N/A  . Number of Children: N/A  . Years of Education: N/A   Occupational History  . Not on file.   Social History Main Topics  . Smoking status: Former Smoker    Types: Cigarettes    Quit date: 04/17/1981  . Smokeless tobacco: Never Used  . Alcohol Use: No  . Drug Use: No  . Sexual Activity: Not on file   Other Topics Concern  . Not on file   Social  History Narrative   Lives with wife in a one story home.  Has 4 children.  Retired from [employer].  Education: high school.    REVIEW OF SYSTEMS: Constitutional: No fevers, chills, or sweats, no generalized fatigue, change in appetite Eyes: No visual changes, double vision, eye pain Ear, nose and throat: No hearing loss, ear pain, nasal congestion, sore throat Cardiovascular: No chest pain, palpitations Respiratory:  No shortness of breath at rest or with exertion, wheezes GastrointestinaI: No nausea, vomiting, diarrhea, abdominal pain, fecal incontinence Genitourinary:  No dysuria, urinary retention or frequency Musculoskeletal:  No neck pain,+ back pain Integumentary: No rash, pruritus, skin lesions Neurological: as above Psychiatric: No depression, insomnia, anxiety Endocrine: No palpitations, fatigue, diaphoresis, mood swings, change in appetite, change in weight, increased thirst Hematologic/Lymphatic:  No anemia, purpura, petechiae. Allergic/Immunologic: no itchy/runny eyes, nasal congestion, recent allergic reactions, rashes  PHYSICAL EXAM: Filed Vitals:   08/02/15 0850  BP: 128/68  Pulse: 64   General: No acute distress Head:  Normocephalic/atraumatic Neck: supple, no paraspinal tenderness, full range of motion Heart:  Regular rate and rhythm Lungs:  Clear to auscultation bilaterally Back: No paraspinal tenderness Skin/Extremities: No rash, no edema Neurological Exam: alert and oriented to person, place, and year (did not know date/month). No aphasia or dysarthria. Fund of knowledge is appropriate.  Recent and remote memory are intact. 2/3 delayed recall. Attention and concentration are normal.    Able to name objects and repeat phrases. Cranial nerves: Pupils equal, round, reactive to light. Extraocular movements intact with no nystagmus. Visual fields full. Facial sensation intact. No facial asymmetry. Tongue, uvula, palate midline.  Motor: Bulk and tone normal,  muscle strength 5/5 throughout with no pronator drift.  Sensation to light touch intact.  No extinction to double simultaneous stimulation.  Deep tendon reflexes +1 throughout, toes downgoing.  Finger to nose testing intact.  Gait narrow-based and steady, difficulty with tandem walk. Romberg negative.  IMPRESSION: This is a pleasant 81 yo RH man with a history of hypertension, hyperlipidemia, diabetes, left CEA, with recurrent prolonged shaking episodes without impaired awareness, however followed by confusion and gibberish speech that he is amnestic of after. He had a GTC in December 2015 in [name] setting of PRES, his daughter reports [name] tremulousness recently is different. Last shaking spell was in December 2016. MRI brain normal, his 48-hour EEG did not show any epileptiform discharges, there was occasional focal slowing over [name] left temporal region. Typical events were not captured. [name] etiology of his symptoms is unclear, [name] prolonged duration and intact  cognition during shaking spells is atypical for seizures, however [name] prolonged confusion after is concerning. He is now on higher dose of Gabepentin  BID without side effects, no episodes since December 2016. He may take an additional dose of clonazepam during [name] spells. We again discussed [place] driving laws to stop driving after an episode of loss of awareness, until 6 months event-free. He will follow-up in 5 months and knows to call for any changes.   Thank you for allowing me to participate in his care.  Please do not hesitate to call for any questions or concerns.  [name] duration of this appointment visit was 15 minutes of face-to-face time with [name] patient.  Greater than 50% of this time was spent in counseling, explanation of diagnosis, planning of further management, and coordination of care.   Patrcia Dolly, M.D.   CC: Dr. Reuel Boom

## 2015-10-28 DIAGNOSIS — E1122 Type 2 diabetes mellitus with diabetic chronic kidney disease: Secondary | ICD-10-CM | POA: Diagnosis not present

## 2015-10-28 DIAGNOSIS — E1142 Type 2 diabetes mellitus with diabetic polyneuropathy: Secondary | ICD-10-CM | POA: Diagnosis not present

## 2015-10-28 DIAGNOSIS — E782 Mixed hyperlipidemia: Secondary | ICD-10-CM | POA: Diagnosis not present

## 2015-10-28 DIAGNOSIS — G6289 Other specified polyneuropathies: Secondary | ICD-10-CM | POA: Diagnosis not present

## 2016-01-05 ENCOUNTER — Ambulatory Visit: Payer: Medicare Other | Admitting: Neurology

## 2016-01-05 DIAGNOSIS — Z029 Encounter for administrative examinations, unspecified: Secondary | ICD-10-CM

## 2016-03-02 DIAGNOSIS — E1122 Type 2 diabetes mellitus with diabetic chronic kidney disease: Secondary | ICD-10-CM | POA: Diagnosis not present

## 2016-03-02 DIAGNOSIS — E782 Mixed hyperlipidemia: Secondary | ICD-10-CM | POA: Diagnosis not present

## 2016-03-02 DIAGNOSIS — E1142 Type 2 diabetes mellitus with diabetic polyneuropathy: Secondary | ICD-10-CM | POA: Diagnosis not present

## 2016-03-06 DIAGNOSIS — E782 Mixed hyperlipidemia: Secondary | ICD-10-CM | POA: Diagnosis not present

## 2016-03-06 DIAGNOSIS — Z23 Encounter for immunization: Secondary | ICD-10-CM | POA: Diagnosis not present

## 2016-03-06 DIAGNOSIS — Z0001 Encounter for general adult medical examination with abnormal findings: Secondary | ICD-10-CM | POA: Diagnosis not present

## 2016-03-06 DIAGNOSIS — E1142 Type 2 diabetes mellitus with diabetic polyneuropathy: Secondary | ICD-10-CM | POA: Diagnosis not present

## 2016-03-06 DIAGNOSIS — Z1212 Encounter for screening for malignant neoplasm of rectum: Secondary | ICD-10-CM | POA: Diagnosis not present

## 2016-03-06 DIAGNOSIS — E1122 Type 2 diabetes mellitus with diabetic chronic kidney disease: Secondary | ICD-10-CM | POA: Diagnosis not present

## 2016-06-28 DIAGNOSIS — D511 Vitamin B12 deficiency anemia due to selective vitamin B12 malabsorption with proteinuria: Secondary | ICD-10-CM | POA: Diagnosis not present

## 2016-06-28 DIAGNOSIS — E782 Mixed hyperlipidemia: Secondary | ICD-10-CM | POA: Diagnosis not present

## 2016-06-28 DIAGNOSIS — E1122 Type 2 diabetes mellitus with diabetic chronic kidney disease: Secondary | ICD-10-CM | POA: Diagnosis not present

## 2016-06-28 DIAGNOSIS — E1142 Type 2 diabetes mellitus with diabetic polyneuropathy: Secondary | ICD-10-CM | POA: Diagnosis not present

## 2016-07-03 DIAGNOSIS — E782 Mixed hyperlipidemia: Secondary | ICD-10-CM | POA: Diagnosis not present

## 2016-07-03 DIAGNOSIS — E1142 Type 2 diabetes mellitus with diabetic polyneuropathy: Secondary | ICD-10-CM | POA: Diagnosis not present

## 2016-07-03 DIAGNOSIS — Z23 Encounter for immunization: Secondary | ICD-10-CM | POA: Diagnosis not present

## 2016-07-03 DIAGNOSIS — E1122 Type 2 diabetes mellitus with diabetic chronic kidney disease: Secondary | ICD-10-CM | POA: Diagnosis not present

## 2016-07-03 DIAGNOSIS — G6289 Other specified polyneuropathies: Secondary | ICD-10-CM | POA: Diagnosis not present

## 2016-07-16 DIAGNOSIS — S299XXA Unspecified injury of thorax, initial encounter: Secondary | ICD-10-CM | POA: Diagnosis not present

## 2016-07-16 DIAGNOSIS — Z8249 Family history of ischemic heart disease and other diseases of the circulatory system: Secondary | ICD-10-CM | POA: Diagnosis not present

## 2016-07-16 DIAGNOSIS — Z7982 Long term (current) use of aspirin: Secondary | ICD-10-CM | POA: Diagnosis not present

## 2016-07-16 DIAGNOSIS — Z886 Allergy status to analgesic agent status: Secondary | ICD-10-CM | POA: Diagnosis not present

## 2016-07-16 DIAGNOSIS — I959 Hypotension, unspecified: Secondary | ICD-10-CM | POA: Diagnosis not present

## 2016-07-16 DIAGNOSIS — I2510 Atherosclerotic heart disease of native coronary artery without angina pectoris: Secondary | ICD-10-CM | POA: Diagnosis not present

## 2016-07-16 DIAGNOSIS — R51 Headache: Secondary | ICD-10-CM | POA: Diagnosis not present

## 2016-07-16 DIAGNOSIS — R262 Difficulty in walking, not elsewhere classified: Secondary | ICD-10-CM | POA: Diagnosis not present

## 2016-07-16 DIAGNOSIS — E785 Hyperlipidemia, unspecified: Secondary | ICD-10-CM | POA: Diagnosis not present

## 2016-07-16 DIAGNOSIS — I10 Essential (primary) hypertension: Secondary | ICD-10-CM | POA: Diagnosis not present

## 2016-07-16 DIAGNOSIS — Z823 Family history of stroke: Secondary | ICD-10-CM | POA: Diagnosis not present

## 2016-07-16 DIAGNOSIS — D72829 Elevated white blood cell count, unspecified: Secondary | ICD-10-CM | POA: Diagnosis not present

## 2016-07-16 DIAGNOSIS — G4700 Insomnia, unspecified: Secondary | ICD-10-CM | POA: Diagnosis not present

## 2016-07-16 DIAGNOSIS — Z836 Family history of other diseases of the respiratory system: Secondary | ICD-10-CM | POA: Diagnosis not present

## 2016-07-16 DIAGNOSIS — Z7984 Long term (current) use of oral hypoglycemic drugs: Secondary | ICD-10-CM | POA: Diagnosis not present

## 2016-07-16 DIAGNOSIS — K219 Gastro-esophageal reflux disease without esophagitis: Secondary | ICD-10-CM | POA: Diagnosis not present

## 2016-07-16 DIAGNOSIS — G2581 Restless legs syndrome: Secondary | ICD-10-CM | POA: Diagnosis not present

## 2016-07-16 DIAGNOSIS — Z888 Allergy status to other drugs, medicaments and biological substances status: Secondary | ICD-10-CM | POA: Diagnosis not present

## 2016-07-16 DIAGNOSIS — M545 Low back pain: Secondary | ICD-10-CM | POA: Diagnosis not present

## 2016-07-16 DIAGNOSIS — Z79899 Other long term (current) drug therapy: Secondary | ICD-10-CM | POA: Diagnosis not present

## 2016-07-16 DIAGNOSIS — E1165 Type 2 diabetes mellitus with hyperglycemia: Secondary | ICD-10-CM | POA: Diagnosis not present

## 2016-07-16 DIAGNOSIS — G8929 Other chronic pain: Secondary | ICD-10-CM | POA: Diagnosis not present

## 2016-07-16 DIAGNOSIS — R569 Unspecified convulsions: Secondary | ICD-10-CM | POA: Diagnosis not present

## 2016-07-16 DIAGNOSIS — E1142 Type 2 diabetes mellitus with diabetic polyneuropathy: Secondary | ICD-10-CM | POA: Diagnosis not present

## 2016-07-16 DIAGNOSIS — Z8489 Family history of other specified conditions: Secondary | ICD-10-CM | POA: Diagnosis not present

## 2016-11-07 DIAGNOSIS — E1122 Type 2 diabetes mellitus with diabetic chronic kidney disease: Secondary | ICD-10-CM | POA: Diagnosis not present

## 2016-11-07 DIAGNOSIS — E782 Mixed hyperlipidemia: Secondary | ICD-10-CM | POA: Diagnosis not present

## 2016-11-07 DIAGNOSIS — Z9189 Other specified personal risk factors, not elsewhere classified: Secondary | ICD-10-CM | POA: Diagnosis not present

## 2016-11-07 DIAGNOSIS — E1142 Type 2 diabetes mellitus with diabetic polyneuropathy: Secondary | ICD-10-CM | POA: Diagnosis not present

## 2016-11-09 DIAGNOSIS — I10 Essential (primary) hypertension: Secondary | ICD-10-CM | POA: Diagnosis not present

## 2016-11-09 DIAGNOSIS — G6289 Other specified polyneuropathies: Secondary | ICD-10-CM | POA: Diagnosis not present

## 2016-11-09 DIAGNOSIS — E1122 Type 2 diabetes mellitus with diabetic chronic kidney disease: Secondary | ICD-10-CM | POA: Diagnosis not present

## 2016-11-09 DIAGNOSIS — E782 Mixed hyperlipidemia: Secondary | ICD-10-CM | POA: Diagnosis not present

## 2016-11-09 DIAGNOSIS — Z23 Encounter for immunization: Secondary | ICD-10-CM | POA: Diagnosis not present

## 2017-03-22 DIAGNOSIS — D511 Vitamin B12 deficiency anemia due to selective vitamin B12 malabsorption with proteinuria: Secondary | ICD-10-CM | POA: Diagnosis not present

## 2017-03-22 DIAGNOSIS — E782 Mixed hyperlipidemia: Secondary | ICD-10-CM | POA: Diagnosis not present

## 2017-03-22 DIAGNOSIS — E1122 Type 2 diabetes mellitus with diabetic chronic kidney disease: Secondary | ICD-10-CM | POA: Diagnosis not present

## 2017-03-22 DIAGNOSIS — E1142 Type 2 diabetes mellitus with diabetic polyneuropathy: Secondary | ICD-10-CM | POA: Diagnosis not present

## 2017-04-05 ENCOUNTER — Other Ambulatory Visit: Payer: Self-pay

## 2017-04-05 DIAGNOSIS — I6529 Occlusion and stenosis of unspecified carotid artery: Secondary | ICD-10-CM

## 2017-04-16 DIAGNOSIS — Z23 Encounter for immunization: Secondary | ICD-10-CM | POA: Diagnosis not present

## 2017-04-16 DIAGNOSIS — Z0001 Encounter for general adult medical examination with abnormal findings: Secondary | ICD-10-CM | POA: Diagnosis not present

## 2017-04-16 DIAGNOSIS — Z1212 Encounter for screening for malignant neoplasm of rectum: Secondary | ICD-10-CM | POA: Diagnosis not present

## 2017-04-16 DIAGNOSIS — E1142 Type 2 diabetes mellitus with diabetic polyneuropathy: Secondary | ICD-10-CM | POA: Diagnosis not present

## 2017-04-16 DIAGNOSIS — E1122 Type 2 diabetes mellitus with diabetic chronic kidney disease: Secondary | ICD-10-CM | POA: Diagnosis not present

## 2017-04-16 DIAGNOSIS — E782 Mixed hyperlipidemia: Secondary | ICD-10-CM | POA: Diagnosis not present

## 2017-05-16 ENCOUNTER — Encounter: Payer: Self-pay | Admitting: Vascular Surgery

## 2017-05-16 ENCOUNTER — Ambulatory Visit (HOSPITAL_COMMUNITY)
Admission: RE | Admit: 2017-05-16 | Discharge: 2017-05-16 | Disposition: A | Discharge status: Home / Self Care | Payer: Medicare Other | Source: Ambulatory Visit | Attending: Vascular Surgery | Admitting: Vascular Surgery

## 2017-05-16 ENCOUNTER — Ambulatory Visit: Payer: Medicare Other | Admitting: Vascular Surgery

## 2017-05-16 VITALS — BP 146/76 | HR 72 | Temp 97.4°F | Resp 16 | Ht 67.0 in | Wt 181.5 lb

## 2017-05-16 DIAGNOSIS — I6522 Occlusion and stenosis of left carotid artery: Secondary | ICD-10-CM

## 2017-05-16 DIAGNOSIS — I6529 Occlusion and stenosis of unspecified carotid artery: Secondary | ICD-10-CM

## 2017-05-16 LAB — VAS US CAROTID
LCCADDIAS: 22 cm/s
LEFT ECA DIAS: -15 cm/s
LEFT VERTEBRAL DIAS: 12 cm/s
LICADDIAS: -28 cm/s
LICADSYS: -96 cm/s
LICAPSYS: -127 cm/s
Left CCA dist sys: 109 cm/s
Left CCA prox dias: 14 cm/s
Left CCA prox sys: 105 cm/s
Left ICA prox dias: -15 cm/s
RIGHT CCA MID DIAS: 16 cm/s
RIGHT ECA DIAS: -13 cm/s
Right CCA prox dias: 17 cm/s
Right CCA prox sys: 107 cm/s
Right cca dist sys: -104 cm/s

## 2017-05-16 NOTE — Progress Notes (Signed)
Patient name: [name] MRN: [id number] DOB: May 28, 1934 Sex: male  REASON FOR VISIT:   Follow-up of carotid disease.  HPI:   [name] is a pleasant 83 y.o. male who underwent a left carotid endarterectomy in September 2012.  He comes in for a yearly follow-up carotid duplex scan.  Since I saw him last, he denies any history of stroke, TIAs, expressive or receptive aphasia, or amaurosis fugax.  He is on aspirin and is on a statin.  He is not a smoker.  Past Medical History:  Diagnosis Date  . Anxiety   . CAD (coronary artery disease)   . Carotid artery occlusion   . CKD (chronic kidney disease), stage III (HCC)   . Diabetes mellitus   . Hyperlipidemia   . Hypertension   . Leg pain    with walking    Family History  Problem Relation Age of Onset  . Hypertension Mother   . Deep vein thrombosis Mother   . Diabetes Mother   . Heart disease Mother   . COPD Father   . Heart disease Sister   . Heart attack Sister        Bypass  . Heart disease Brother        Heart Disease before age 34  . Heart disease Brother   . Hypertension Son     SOCIAL HISTORY: Social History   Tobacco Use  . Smoking status: Former Smoker    Types: Cigarettes    Last attempt to quit: 04/17/1981    Years since quitting: 36.1  . Smokeless tobacco: Never Used  Substance Use Topics  . Alcohol use: No    Alcohol/week: 0.0 oz    Allergies  Allergen Reactions  . Omnipaque [Iohexol]   . Paxil [Paroxetine Hcl]     Unknown reaction, on file with [facility]  . Topamax [Topiramate]     Unknown reaction, on file with [facility]  . Codeine Nausea And Vomiting    Current Outpatient Medications  Medication Sig Dispense Refill  . aspirin EC 81 MG tablet Take 81 mg by mouth daily.    . carvedilol (COREG) 12.5 MG tablet Take 1 tablet (12.5 mg total) by mouth 2 (two) times daily with a meal. 60 tablet 0  . gabapentin (NEURONTIN) 300 MG capsule Take 3 tablets twice a day 540 capsule 3  .  glipiZIDE (GLUCOTROL) 5 MG tablet Take 5 mg by mouth 2 (two) times daily.  0  . levETIRAcetam (KEPPRA) 500 MG tablet Take 500 mg by mouth 2 (two) times daily.    Marland Kitchen lisinopril (PRINIVIL,ZESTRIL) 5 MG tablet Take 1 tablet by mouth daily.    . metFORMIN (GLUCOPHAGE-XR) 500 MG 24 hr tablet Take 1,000 mg by mouth 2 (two) times daily.  0  . omeprazole (PRILOSEC) 20 MG capsule Take 40 mg by mouth daily.   3  . simvastatin (ZOCOR) 80 MG tablet Take 40 mg by mouth at bedtime.     . traZODone (DESYREL) 50 MG tablet Take 50 mg by mouth at bedtime.      No current facility-administered medications for this visit.     REVIEW OF SYSTEMS:  [X]  denotes positive finding, [ ]  denotes negative finding Cardiac  Comments:  Chest pain or chest pressure: x   Shortness of breath upon exertion: x   Short of breath when lying flat: x   Irregular heart rhythm:        Vascular    Pain in calf, thigh, or hip  brought on by ambulation:    Pain in feet at night that wakes you up from your sleep:     Blood clot in your veins:    Leg swelling:         Pulmonary    Oxygen at home:    Productive cough:     Wheezing:         Neurologic    Sudden weakness in arms or legs:     Sudden numbness in arms or legs:     Sudden onset of difficulty speaking or slurred speech:    Temporary loss of vision in one eye:     Problems with dizziness:         Gastrointestinal    Blood in stool:     Vomited blood:         Genitourinary    Burning when urinating:     Blood in urine:        Psychiatric    Major depression:         Hematologic    Bleeding problems:    Problems with blood clotting too easily:        Skin    Rashes or ulcers:        Constitutional    Fever or chills:     PHYSICAL EXAM:   Vitals:   05/16/17 1401 05/16/17 1404  BP: (!) 142/77 (!) 146/76  Pulse: 72   Resp: 16   Temp: (!) 97.4 F (36.3 C)   TempSrc: Oral   SpO2: 98%   Weight: 181 lb 8 oz (82.3 kg)   Height: 5\' 7"  (1.702 m)      GENERAL: The patient is a well-nourished male, in no acute distress. The vital signs are documented above. CARDIAC: There is a regular rate and rhythm.  VASCULAR: I do not detect carotid bruits. He has no significant lower extremity swelling. PULMONARY: There is good air exchange bilaterally without wheezing or rales. ABDOMEN: Soft and non-tender with normal pitched bowel sounds.  MUSCULOSKELETAL: There are no major deformities or cyanosis. NEUROLOGIC: No focal weakness or paresthesias are detected. SKIN: There are no ulcers or rashes noted. PSYCHIATRIC: The patient has a normal affect.  DATA:    CAROTID DUPLEX: I have independently interpreted his carotid duplex scan today.  He has a less than 39% stenosis bilaterally.  Both vertebral arteries are patent with antegrade flow.  MEDICAL ISSUES:   STATUS POST LEFT CAROTID ENDARTERECTOMY: Patient is doing well status post left carotid endarterectomy.  He has no evidence of recurrent stenosis.  He has no significant stenosis on the right.  I have ordered a follow-up carotid duplex scan in 1 year and I will see him back at that time.  He knows to call sooner if he has problems.  He knows to stay on his aspirin and his statin.  Waverly Ferrarihristopher Arshdeep Bolger [facility] Bee[facility] [phone]

## 2017-08-14 DIAGNOSIS — E1142 Type 2 diabetes mellitus with diabetic polyneuropathy: Secondary | ICD-10-CM | POA: Diagnosis not present

## 2017-08-14 DIAGNOSIS — I10 Essential (primary) hypertension: Secondary | ICD-10-CM | POA: Diagnosis not present

## 2017-08-14 DIAGNOSIS — E1122 Type 2 diabetes mellitus with diabetic chronic kidney disease: Secondary | ICD-10-CM | POA: Diagnosis not present

## 2017-08-14 DIAGNOSIS — D511 Vitamin B12 deficiency anemia due to selective vitamin B12 malabsorption with proteinuria: Secondary | ICD-10-CM | POA: Diagnosis not present

## 2017-08-14 DIAGNOSIS — K210 Gastro-esophageal reflux disease with esophagitis: Secondary | ICD-10-CM | POA: Diagnosis not present

## 2017-08-20 DIAGNOSIS — I10 Essential (primary) hypertension: Secondary | ICD-10-CM | POA: Diagnosis not present

## 2017-08-20 DIAGNOSIS — E1142 Type 2 diabetes mellitus with diabetic polyneuropathy: Secondary | ICD-10-CM | POA: Diagnosis not present

## 2017-08-20 DIAGNOSIS — E782 Mixed hyperlipidemia: Secondary | ICD-10-CM | POA: Diagnosis not present

## 2017-08-20 DIAGNOSIS — E1122 Type 2 diabetes mellitus with diabetic chronic kidney disease: Secondary | ICD-10-CM | POA: Diagnosis not present

## 2017-12-04 DIAGNOSIS — M1611 Unilateral primary osteoarthritis, right hip: Secondary | ICD-10-CM | POA: Diagnosis not present

## 2017-12-04 DIAGNOSIS — M545 Low back pain: Secondary | ICD-10-CM | POA: Diagnosis not present

## 2017-12-11 DIAGNOSIS — E782 Mixed hyperlipidemia: Secondary | ICD-10-CM | POA: Diagnosis not present

## 2017-12-11 DIAGNOSIS — E1122 Type 2 diabetes mellitus with diabetic chronic kidney disease: Secondary | ICD-10-CM | POA: Diagnosis not present

## 2017-12-11 DIAGNOSIS — E1142 Type 2 diabetes mellitus with diabetic polyneuropathy: Secondary | ICD-10-CM | POA: Diagnosis not present

## 2017-12-11 DIAGNOSIS — I10 Essential (primary) hypertension: Secondary | ICD-10-CM | POA: Diagnosis not present

## 2017-12-14 DIAGNOSIS — M545 Low back pain: Secondary | ICD-10-CM | POA: Diagnosis not present

## 2017-12-14 DIAGNOSIS — M5127 Other intervertebral disc displacement, lumbosacral region: Secondary | ICD-10-CM | POA: Diagnosis not present

## 2017-12-14 DIAGNOSIS — M48061 Spinal stenosis, lumbar region without neurogenic claudication: Secondary | ICD-10-CM | POA: Diagnosis not present

## 2017-12-14 DIAGNOSIS — M47816 Spondylosis without myelopathy or radiculopathy, lumbar region: Secondary | ICD-10-CM | POA: Diagnosis not present

## 2017-12-14 DIAGNOSIS — Z981 Arthrodesis status: Secondary | ICD-10-CM | POA: Diagnosis not present

## 2017-12-25 ENCOUNTER — Other Ambulatory Visit: Payer: Self-pay

## 2017-12-25 DIAGNOSIS — M1611 Unilateral primary osteoarthritis, right hip: Secondary | ICD-10-CM | POA: Diagnosis not present

## 2017-12-25 DIAGNOSIS — M545 Low back pain: Secondary | ICD-10-CM | POA: Diagnosis not present

## 2017-12-25 NOTE — Patient Outreach (Signed)
[facility] Care Management  12/25/2017  Hance Freund 16-Feb-1935 [id number]   Medication Adherence call to Mr. Terrian Charlie left a message for patient to call back patient is due on all four of his medication Lisinopril 5 mg, Glipizide 5 mg Simvastatin 80 mg and Metformin er 500 mg. Mr. Siglin is showing past due under [employer].   Lillia Abed CPhT Pharmacy Technician [facility] Management Direct Dial [phone]  Fax [phone] [email or web address]

## 2017-12-27 ENCOUNTER — Other Ambulatory Visit: Payer: Self-pay | Admitting: Family Medicine

## 2017-12-27 DIAGNOSIS — M545 Low back pain: Principal | ICD-10-CM

## 2017-12-27 DIAGNOSIS — G8929 Other chronic pain: Secondary | ICD-10-CM

## 2018-01-10 ENCOUNTER — Ambulatory Visit
Admission: RE | Admit: 2018-01-10 | Discharge: 2018-01-10 | Disposition: A | Discharge status: Home / Self Care | Payer: Medicare Other | Source: Ambulatory Visit | Attending: Family Medicine | Admitting: Family Medicine

## 2018-01-10 DIAGNOSIS — M47817 Spondylosis without myelopathy or radiculopathy, lumbosacral region: Secondary | ICD-10-CM | POA: Diagnosis not present

## 2018-01-10 DIAGNOSIS — M545 Low back pain: Principal | ICD-10-CM

## 2018-01-10 DIAGNOSIS — G8929 Other chronic pain: Secondary | ICD-10-CM

## 2018-01-10 MED ORDER — METHYLPREDNISOLONE ACETATE 40 MG/ML INJ SUSP (RADIOLOG
120.0000 mg | Freq: Once | INTRAMUSCULAR | Status: AC
  Administered 2018-01-10: 120 mg via EPIDURAL

## 2018-01-10 MED ORDER — IOPAMIDOL (ISOVUE-M 200) INJECTION 41%
1.0000 mL | Freq: Once | INTRAMUSCULAR | Status: AC
  Administered 2018-01-10: 1 mL via EPIDURAL

## 2018-01-10 NOTE — Discharge Instructions (Signed)

## 2018-01-29 ENCOUNTER — Other Ambulatory Visit: Payer: Self-pay | Admitting: Family Medicine

## 2018-01-29 DIAGNOSIS — G8929 Other chronic pain: Secondary | ICD-10-CM

## 2018-01-29 DIAGNOSIS — M545 Low back pain, unspecified: Secondary | ICD-10-CM

## 2018-02-07 ENCOUNTER — Ambulatory Visit
Admission: RE | Admit: 2018-02-07 | Discharge: 2018-02-07 | Disposition: A | Discharge status: Home / Self Care | Payer: Medicare Other | Source: Ambulatory Visit | Attending: Family Medicine | Admitting: Family Medicine

## 2018-02-07 DIAGNOSIS — M545 Low back pain, unspecified: Secondary | ICD-10-CM

## 2018-02-07 DIAGNOSIS — G8929 Other chronic pain: Secondary | ICD-10-CM

## 2018-02-07 DIAGNOSIS — M47817 Spondylosis without myelopathy or radiculopathy, lumbosacral region: Secondary | ICD-10-CM | POA: Diagnosis not present

## 2018-02-07 MED ORDER — METHYLPREDNISOLONE ACETATE 40 MG/ML INJ SUSP (RADIOLOG
120.0000 mg | Freq: Once | INTRAMUSCULAR | Status: AC
  Administered 2018-02-07: 120 mg via EPIDURAL

## 2018-02-07 MED ORDER — IOPAMIDOL (ISOVUE-M 200) INJECTION 41%
1.0000 mL | Freq: Once | INTRAMUSCULAR | Status: AC
  Administered 2018-02-07: 1 mL via EPIDURAL

## 2018-02-21 ENCOUNTER — Other Ambulatory Visit: Payer: Self-pay | Admitting: Family Medicine

## 2018-02-21 DIAGNOSIS — M545 Low back pain: Principal | ICD-10-CM

## 2018-02-21 DIAGNOSIS — G8929 Other chronic pain: Secondary | ICD-10-CM

## 2018-02-28 ENCOUNTER — Ambulatory Visit
Admission: RE | Admit: 2018-02-28 | Discharge: 2018-02-28 | Disposition: A | Discharge status: Home / Self Care | Payer: Medicare Other | Source: Ambulatory Visit | Attending: Family Medicine | Admitting: Family Medicine

## 2018-02-28 ENCOUNTER — Other Ambulatory Visit: Payer: Self-pay | Admitting: Family Medicine

## 2018-02-28 DIAGNOSIS — M545 Low back pain, unspecified: Secondary | ICD-10-CM

## 2018-02-28 DIAGNOSIS — G8929 Other chronic pain: Secondary | ICD-10-CM

## 2018-02-28 MED ORDER — IOPAMIDOL (ISOVUE-M 200) INJECTION 41%: 1.0000 mL | Freq: Once | INTRAMUSCULAR | Status: DC

## 2018-02-28 MED ORDER — METHYLPREDNISOLONE ACETATE 40 MG/ML INJ SUSP (RADIOLOG: 120.0000 mg | Freq: Once | INTRAMUSCULAR | Status: DC

## 2018-04-18 DIAGNOSIS — Z9189 Other specified personal risk factors, not elsewhere classified: Secondary | ICD-10-CM | POA: Diagnosis not present

## 2018-04-18 DIAGNOSIS — K210 Gastro-esophageal reflux disease with esophagitis: Secondary | ICD-10-CM | POA: Diagnosis not present

## 2018-04-18 DIAGNOSIS — E782 Mixed hyperlipidemia: Secondary | ICD-10-CM | POA: Diagnosis not present

## 2018-04-18 DIAGNOSIS — E1122 Type 2 diabetes mellitus with diabetic chronic kidney disease: Secondary | ICD-10-CM | POA: Diagnosis not present

## 2018-04-18 DIAGNOSIS — E1142 Type 2 diabetes mellitus with diabetic polyneuropathy: Secondary | ICD-10-CM | POA: Diagnosis not present

## 2018-04-22 DIAGNOSIS — I10 Essential (primary) hypertension: Secondary | ICD-10-CM | POA: Diagnosis not present

## 2018-04-22 DIAGNOSIS — Z0001 Encounter for general adult medical examination with abnormal findings: Secondary | ICD-10-CM | POA: Diagnosis not present

## 2018-05-21 DIAGNOSIS — Z13820 Encounter for screening for osteoporosis: Secondary | ICD-10-CM | POA: Diagnosis not present

## 2018-05-21 DIAGNOSIS — M810 Age-related osteoporosis without current pathological fracture: Secondary | ICD-10-CM | POA: Diagnosis not present

## 2018-07-10 ENCOUNTER — Ambulatory Visit: Payer: Medicare Other | Admitting: Vascular Surgery

## 2018-07-10 ENCOUNTER — Encounter (HOSPITAL_COMMUNITY): Payer: Medicare Other

## 2018-07-22 ENCOUNTER — Encounter (INDEPENDENT_AMBULATORY_CARE_PROVIDER_SITE_OTHER): Payer: Self-pay

## 2018-08-13 DIAGNOSIS — R0602 Shortness of breath: Secondary | ICD-10-CM | POA: Diagnosis not present

## 2018-08-13 DIAGNOSIS — I10 Essential (primary) hypertension: Secondary | ICD-10-CM | POA: Diagnosis not present

## 2018-08-13 DIAGNOSIS — I2510 Atherosclerotic heart disease of native coronary artery without angina pectoris: Secondary | ICD-10-CM | POA: Diagnosis not present

## 2018-08-13 DIAGNOSIS — R079 Chest pain, unspecified: Secondary | ICD-10-CM | POA: Diagnosis not present

## 2018-08-15 DIAGNOSIS — E1142 Type 2 diabetes mellitus with diabetic polyneuropathy: Secondary | ICD-10-CM | POA: Diagnosis not present

## 2018-08-15 DIAGNOSIS — E782 Mixed hyperlipidemia: Secondary | ICD-10-CM | POA: Diagnosis not present

## 2018-08-15 DIAGNOSIS — I10 Essential (primary) hypertension: Secondary | ICD-10-CM | POA: Diagnosis not present

## 2018-08-15 DIAGNOSIS — E1122 Type 2 diabetes mellitus with diabetic chronic kidney disease: Secondary | ICD-10-CM | POA: Diagnosis not present

## 2018-08-20 DIAGNOSIS — E1122 Type 2 diabetes mellitus with diabetic chronic kidney disease: Secondary | ICD-10-CM | POA: Diagnosis not present

## 2018-08-20 DIAGNOSIS — E1142 Type 2 diabetes mellitus with diabetic polyneuropathy: Secondary | ICD-10-CM | POA: Diagnosis not present

## 2018-08-20 DIAGNOSIS — I2510 Atherosclerotic heart disease of native coronary artery without angina pectoris: Secondary | ICD-10-CM | POA: Diagnosis not present

## 2018-08-20 DIAGNOSIS — K219 Gastro-esophageal reflux disease without esophagitis: Secondary | ICD-10-CM | POA: Diagnosis not present

## 2018-08-20 DIAGNOSIS — I10 Essential (primary) hypertension: Secondary | ICD-10-CM | POA: Diagnosis not present

## 2019-01-02 DIAGNOSIS — Z23 Encounter for immunization: Secondary | ICD-10-CM | POA: Diagnosis not present

## 2019-01-02 DIAGNOSIS — I10 Essential (primary) hypertension: Secondary | ICD-10-CM | POA: Diagnosis not present

## 2019-01-02 DIAGNOSIS — E1142 Type 2 diabetes mellitus with diabetic polyneuropathy: Secondary | ICD-10-CM | POA: Diagnosis not present

## 2019-01-02 DIAGNOSIS — G6289 Other specified polyneuropathies: Secondary | ICD-10-CM | POA: Diagnosis not present

## 2019-01-02 DIAGNOSIS — E1122 Type 2 diabetes mellitus with diabetic chronic kidney disease: Secondary | ICD-10-CM | POA: Diagnosis not present

## 2019-01-02 DIAGNOSIS — E782 Mixed hyperlipidemia: Secondary | ICD-10-CM | POA: Diagnosis not present

## 2019-01-02 DIAGNOSIS — K429 Umbilical hernia without obstruction or gangrene: Secondary | ICD-10-CM | POA: Diagnosis not present

## 2019-01-02 DIAGNOSIS — I2510 Atherosclerotic heart disease of native coronary artery without angina pectoris: Secondary | ICD-10-CM | POA: Diagnosis not present

## 2019-02-14 DIAGNOSIS — I10 Essential (primary) hypertension: Secondary | ICD-10-CM | POA: Diagnosis not present

## 2019-02-14 DIAGNOSIS — E782 Mixed hyperlipidemia: Secondary | ICD-10-CM | POA: Diagnosis not present

## 2019-03-17 DIAGNOSIS — I10 Essential (primary) hypertension: Secondary | ICD-10-CM | POA: Diagnosis not present

## 2019-03-17 DIAGNOSIS — E782 Mixed hyperlipidemia: Secondary | ICD-10-CM | POA: Diagnosis not present

## 2019-04-17 DIAGNOSIS — I2510 Atherosclerotic heart disease of native coronary artery without angina pectoris: Secondary | ICD-10-CM | POA: Diagnosis not present

## 2019-04-17 DIAGNOSIS — E1122 Type 2 diabetes mellitus with diabetic chronic kidney disease: Secondary | ICD-10-CM | POA: Diagnosis not present

## 2019-04-30 DIAGNOSIS — Z0001 Encounter for general adult medical examination with abnormal findings: Secondary | ICD-10-CM | POA: Diagnosis not present

## 2019-05-06 DIAGNOSIS — I2510 Atherosclerotic heart disease of native coronary artery without angina pectoris: Secondary | ICD-10-CM | POA: Diagnosis not present

## 2019-05-06 DIAGNOSIS — I10 Essential (primary) hypertension: Secondary | ICD-10-CM | POA: Diagnosis not present

## 2019-05-06 DIAGNOSIS — E1142 Type 2 diabetes mellitus with diabetic polyneuropathy: Secondary | ICD-10-CM | POA: Diagnosis not present

## 2019-05-06 DIAGNOSIS — Z0001 Encounter for general adult medical examination with abnormal findings: Secondary | ICD-10-CM | POA: Diagnosis not present

## 2019-05-06 DIAGNOSIS — E1122 Type 2 diabetes mellitus with diabetic chronic kidney disease: Secondary | ICD-10-CM | POA: Diagnosis not present

## 2019-05-14 DIAGNOSIS — E1122 Type 2 diabetes mellitus with diabetic chronic kidney disease: Secondary | ICD-10-CM | POA: Diagnosis not present

## 2019-05-14 DIAGNOSIS — I10 Essential (primary) hypertension: Secondary | ICD-10-CM | POA: Diagnosis not present

## 2019-05-29 DIAGNOSIS — E1122 Type 2 diabetes mellitus with diabetic chronic kidney disease: Secondary | ICD-10-CM | POA: Diagnosis not present

## 2019-05-29 DIAGNOSIS — I10 Essential (primary) hypertension: Secondary | ICD-10-CM | POA: Diagnosis not present

## 2019-05-29 DIAGNOSIS — K2100 Gastro-esophageal reflux disease with esophagitis, without bleeding: Secondary | ICD-10-CM | POA: Diagnosis not present

## 2019-06-12 DIAGNOSIS — I10 Essential (primary) hypertension: Secondary | ICD-10-CM | POA: Diagnosis not present

## 2019-06-12 DIAGNOSIS — E1122 Type 2 diabetes mellitus with diabetic chronic kidney disease: Secondary | ICD-10-CM | POA: Diagnosis not present

## 2019-06-12 DIAGNOSIS — K2100 Gastro-esophageal reflux disease with esophagitis, without bleeding: Secondary | ICD-10-CM | POA: Diagnosis not present

## 2019-06-12 DIAGNOSIS — E782 Mixed hyperlipidemia: Secondary | ICD-10-CM | POA: Diagnosis not present

## 2019-07-29 DIAGNOSIS — E1122 Type 2 diabetes mellitus with diabetic chronic kidney disease: Secondary | ICD-10-CM | POA: Diagnosis not present

## 2019-07-29 DIAGNOSIS — K2100 Gastro-esophageal reflux disease with esophagitis, without bleeding: Secondary | ICD-10-CM | POA: Diagnosis not present

## 2019-07-29 DIAGNOSIS — N183 Chronic kidney disease, stage 3 unspecified: Secondary | ICD-10-CM | POA: Diagnosis not present

## 2019-07-29 DIAGNOSIS — I10 Essential (primary) hypertension: Secondary | ICD-10-CM | POA: Diagnosis not present

## 2019-07-29 DIAGNOSIS — E782 Mixed hyperlipidemia: Secondary | ICD-10-CM | POA: Diagnosis not present

## 2019-08-01 DIAGNOSIS — I10 Essential (primary) hypertension: Secondary | ICD-10-CM | POA: Diagnosis not present

## 2019-08-01 DIAGNOSIS — E1122 Type 2 diabetes mellitus with diabetic chronic kidney disease: Secondary | ICD-10-CM | POA: Diagnosis not present

## 2019-08-01 DIAGNOSIS — E1142 Type 2 diabetes mellitus with diabetic polyneuropathy: Secondary | ICD-10-CM | POA: Diagnosis not present

## 2019-08-01 DIAGNOSIS — I2510 Atherosclerotic heart disease of native coronary artery without angina pectoris: Secondary | ICD-10-CM | POA: Diagnosis not present

## 2019-08-01 DIAGNOSIS — Z0001 Encounter for general adult medical examination with abnormal findings: Secondary | ICD-10-CM | POA: Diagnosis not present

## 2019-10-15 DIAGNOSIS — N1831 Chronic kidney disease, stage 3a: Secondary | ICD-10-CM | POA: Diagnosis not present

## 2019-10-15 DIAGNOSIS — I129 Hypertensive chronic kidney disease with stage 1 through stage 4 chronic kidney disease, or unspecified chronic kidney disease: Secondary | ICD-10-CM | POA: Diagnosis not present

## 2019-10-15 DIAGNOSIS — E7849 Other hyperlipidemia: Secondary | ICD-10-CM | POA: Diagnosis not present

## 2019-10-15 DIAGNOSIS — E1122 Type 2 diabetes mellitus with diabetic chronic kidney disease: Secondary | ICD-10-CM | POA: Diagnosis not present

## 2019-12-03 DIAGNOSIS — E1142 Type 2 diabetes mellitus with diabetic polyneuropathy: Secondary | ICD-10-CM | POA: Diagnosis not present

## 2019-12-03 DIAGNOSIS — I10 Essential (primary) hypertension: Secondary | ICD-10-CM | POA: Diagnosis not present

## 2019-12-03 DIAGNOSIS — E782 Mixed hyperlipidemia: Secondary | ICD-10-CM | POA: Diagnosis not present

## 2019-12-03 DIAGNOSIS — K2100 Gastro-esophageal reflux disease with esophagitis, without bleeding: Secondary | ICD-10-CM | POA: Diagnosis not present

## 2019-12-03 DIAGNOSIS — E1122 Type 2 diabetes mellitus with diabetic chronic kidney disease: Secondary | ICD-10-CM | POA: Diagnosis not present

## 2019-12-05 DIAGNOSIS — E782 Mixed hyperlipidemia: Secondary | ICD-10-CM | POA: Diagnosis not present

## 2019-12-05 DIAGNOSIS — E1142 Type 2 diabetes mellitus with diabetic polyneuropathy: Secondary | ICD-10-CM | POA: Diagnosis not present

## 2019-12-05 DIAGNOSIS — I10 Essential (primary) hypertension: Secondary | ICD-10-CM | POA: Diagnosis not present

## 2019-12-05 DIAGNOSIS — E1122 Type 2 diabetes mellitus with diabetic chronic kidney disease: Secondary | ICD-10-CM | POA: Diagnosis not present

## 2019-12-16 DIAGNOSIS — N1831 Chronic kidney disease, stage 3a: Secondary | ICD-10-CM | POA: Diagnosis not present

## 2019-12-16 DIAGNOSIS — E1122 Type 2 diabetes mellitus with diabetic chronic kidney disease: Secondary | ICD-10-CM | POA: Diagnosis not present

## 2019-12-16 DIAGNOSIS — E7849 Other hyperlipidemia: Secondary | ICD-10-CM | POA: Diagnosis not present

## 2019-12-16 DIAGNOSIS — I129 Hypertensive chronic kidney disease with stage 1 through stage 4 chronic kidney disease, or unspecified chronic kidney disease: Secondary | ICD-10-CM | POA: Diagnosis not present

## 2020-01-15 DIAGNOSIS — E1122 Type 2 diabetes mellitus with diabetic chronic kidney disease: Secondary | ICD-10-CM | POA: Diagnosis not present

## 2020-01-15 DIAGNOSIS — I129 Hypertensive chronic kidney disease with stage 1 through stage 4 chronic kidney disease, or unspecified chronic kidney disease: Secondary | ICD-10-CM | POA: Diagnosis not present

## 2020-01-15 DIAGNOSIS — N1831 Chronic kidney disease, stage 3a: Secondary | ICD-10-CM | POA: Diagnosis not present

## 2020-01-26 DIAGNOSIS — L03119 Cellulitis of unspecified part of limb: Secondary | ICD-10-CM | POA: Diagnosis not present

## 2020-01-26 DIAGNOSIS — I5032 Chronic diastolic (congestive) heart failure: Secondary | ICD-10-CM | POA: Diagnosis not present

## 2020-02-14 DIAGNOSIS — N1831 Chronic kidney disease, stage 3a: Secondary | ICD-10-CM | POA: Diagnosis not present

## 2020-02-14 DIAGNOSIS — E7849 Other hyperlipidemia: Secondary | ICD-10-CM | POA: Diagnosis not present

## 2020-02-14 DIAGNOSIS — I129 Hypertensive chronic kidney disease with stage 1 through stage 4 chronic kidney disease, or unspecified chronic kidney disease: Secondary | ICD-10-CM | POA: Diagnosis not present

## 2020-02-14 DIAGNOSIS — E1122 Type 2 diabetes mellitus with diabetic chronic kidney disease: Secondary | ICD-10-CM | POA: Diagnosis not present

## 2020-03-16 DIAGNOSIS — I129 Hypertensive chronic kidney disease with stage 1 through stage 4 chronic kidney disease, or unspecified chronic kidney disease: Secondary | ICD-10-CM | POA: Diagnosis not present

## 2020-03-16 DIAGNOSIS — E1122 Type 2 diabetes mellitus with diabetic chronic kidney disease: Secondary | ICD-10-CM | POA: Diagnosis not present

## 2020-03-16 DIAGNOSIS — N1831 Chronic kidney disease, stage 3a: Secondary | ICD-10-CM | POA: Diagnosis not present

## 2020-04-12 DIAGNOSIS — D528 Other folate deficiency anemias: Secondary | ICD-10-CM | POA: Diagnosis not present

## 2020-04-12 DIAGNOSIS — E1122 Type 2 diabetes mellitus with diabetic chronic kidney disease: Secondary | ICD-10-CM | POA: Diagnosis not present

## 2020-04-12 DIAGNOSIS — D518 Other vitamin B12 deficiency anemias: Secondary | ICD-10-CM | POA: Diagnosis not present

## 2020-04-12 DIAGNOSIS — E1142 Type 2 diabetes mellitus with diabetic polyneuropathy: Secondary | ICD-10-CM | POA: Diagnosis not present

## 2020-04-12 DIAGNOSIS — D649 Anemia, unspecified: Secondary | ICD-10-CM | POA: Diagnosis not present

## 2020-04-12 DIAGNOSIS — E782 Mixed hyperlipidemia: Secondary | ICD-10-CM | POA: Diagnosis not present

## 2020-04-15 DIAGNOSIS — I10 Essential (primary) hypertension: Secondary | ICD-10-CM | POA: Diagnosis not present

## 2020-04-15 DIAGNOSIS — E1122 Type 2 diabetes mellitus with diabetic chronic kidney disease: Secondary | ICD-10-CM | POA: Diagnosis not present

## 2020-04-15 DIAGNOSIS — I2510 Atherosclerotic heart disease of native coronary artery without angina pectoris: Secondary | ICD-10-CM | POA: Diagnosis not present

## 2020-04-15 DIAGNOSIS — Z0001 Encounter for general adult medical examination with abnormal findings: Secondary | ICD-10-CM | POA: Diagnosis not present

## 2020-04-15 DIAGNOSIS — Z23 Encounter for immunization: Secondary | ICD-10-CM | POA: Diagnosis not present

## 2020-04-15 DIAGNOSIS — Z1212 Encounter for screening for malignant neoplasm of rectum: Secondary | ICD-10-CM | POA: Diagnosis not present

## 2020-04-15 DIAGNOSIS — E1142 Type 2 diabetes mellitus with diabetic polyneuropathy: Secondary | ICD-10-CM | POA: Diagnosis not present

## 2020-05-15 DIAGNOSIS — N1831 Chronic kidney disease, stage 3a: Secondary | ICD-10-CM | POA: Diagnosis not present

## 2020-05-15 DIAGNOSIS — E7849 Other hyperlipidemia: Secondary | ICD-10-CM | POA: Diagnosis not present

## 2020-05-15 DIAGNOSIS — I129 Hypertensive chronic kidney disease with stage 1 through stage 4 chronic kidney disease, or unspecified chronic kidney disease: Secondary | ICD-10-CM | POA: Diagnosis not present

## 2020-05-15 DIAGNOSIS — E1122 Type 2 diabetes mellitus with diabetic chronic kidney disease: Secondary | ICD-10-CM | POA: Diagnosis not present

## 2020-06-14 DIAGNOSIS — E7849 Other hyperlipidemia: Secondary | ICD-10-CM | POA: Diagnosis not present

## 2020-06-14 DIAGNOSIS — N1831 Chronic kidney disease, stage 3a: Secondary | ICD-10-CM | POA: Diagnosis not present

## 2020-06-14 DIAGNOSIS — E1122 Type 2 diabetes mellitus with diabetic chronic kidney disease: Secondary | ICD-10-CM | POA: Diagnosis not present

## 2020-06-14 DIAGNOSIS — I129 Hypertensive chronic kidney disease with stage 1 through stage 4 chronic kidney disease, or unspecified chronic kidney disease: Secondary | ICD-10-CM | POA: Diagnosis not present

## 2020-07-09 DIAGNOSIS — N183 Chronic kidney disease, stage 3 unspecified: Secondary | ICD-10-CM | POA: Diagnosis not present

## 2020-07-09 DIAGNOSIS — D509 Iron deficiency anemia, unspecified: Secondary | ICD-10-CM | POA: Diagnosis not present

## 2020-07-09 DIAGNOSIS — D529 Folate deficiency anemia, unspecified: Secondary | ICD-10-CM | POA: Diagnosis not present

## 2020-07-09 DIAGNOSIS — E1122 Type 2 diabetes mellitus with diabetic chronic kidney disease: Secondary | ICD-10-CM | POA: Diagnosis not present

## 2020-07-09 DIAGNOSIS — E7849 Other hyperlipidemia: Secondary | ICD-10-CM | POA: Diagnosis not present

## 2020-07-09 DIAGNOSIS — E782 Mixed hyperlipidemia: Secondary | ICD-10-CM | POA: Diagnosis not present

## 2020-07-09 DIAGNOSIS — I10 Essential (primary) hypertension: Secondary | ICD-10-CM | POA: Diagnosis not present

## 2020-07-09 DIAGNOSIS — D519 Vitamin B12 deficiency anemia, unspecified: Secondary | ICD-10-CM | POA: Diagnosis not present

## 2020-07-14 DIAGNOSIS — N1831 Chronic kidney disease, stage 3a: Secondary | ICD-10-CM | POA: Diagnosis not present

## 2020-07-14 DIAGNOSIS — I129 Hypertensive chronic kidney disease with stage 1 through stage 4 chronic kidney disease, or unspecified chronic kidney disease: Secondary | ICD-10-CM | POA: Diagnosis not present

## 2020-07-14 DIAGNOSIS — I2510 Atherosclerotic heart disease of native coronary artery without angina pectoris: Secondary | ICD-10-CM | POA: Diagnosis not present

## 2020-07-14 DIAGNOSIS — E1122 Type 2 diabetes mellitus with diabetic chronic kidney disease: Secondary | ICD-10-CM | POA: Diagnosis not present

## 2020-07-14 DIAGNOSIS — E1142 Type 2 diabetes mellitus with diabetic polyneuropathy: Secondary | ICD-10-CM | POA: Diagnosis not present

## 2020-07-14 DIAGNOSIS — E7849 Other hyperlipidemia: Secondary | ICD-10-CM | POA: Diagnosis not present

## 2020-07-14 DIAGNOSIS — I10 Essential (primary) hypertension: Secondary | ICD-10-CM | POA: Diagnosis not present

## 2020-07-14 DIAGNOSIS — D692 Other nonthrombocytopenic purpura: Secondary | ICD-10-CM | POA: Diagnosis not present

## 2020-07-14 DIAGNOSIS — D509 Iron deficiency anemia, unspecified: Secondary | ICD-10-CM | POA: Diagnosis not present

## 2020-08-14 DIAGNOSIS — E7849 Other hyperlipidemia: Secondary | ICD-10-CM | POA: Diagnosis not present

## 2020-08-14 DIAGNOSIS — I129 Hypertensive chronic kidney disease with stage 1 through stage 4 chronic kidney disease, or unspecified chronic kidney disease: Secondary | ICD-10-CM | POA: Diagnosis not present

## 2020-08-14 DIAGNOSIS — E1122 Type 2 diabetes mellitus with diabetic chronic kidney disease: Secondary | ICD-10-CM | POA: Diagnosis not present

## 2020-08-14 DIAGNOSIS — N1831 Chronic kidney disease, stage 3a: Secondary | ICD-10-CM | POA: Diagnosis not present

## 2020-09-13 DIAGNOSIS — E7849 Other hyperlipidemia: Secondary | ICD-10-CM | POA: Diagnosis not present

## 2020-09-13 DIAGNOSIS — I129 Hypertensive chronic kidney disease with stage 1 through stage 4 chronic kidney disease, or unspecified chronic kidney disease: Secondary | ICD-10-CM | POA: Diagnosis not present

## 2020-09-13 DIAGNOSIS — E1122 Type 2 diabetes mellitus with diabetic chronic kidney disease: Secondary | ICD-10-CM | POA: Diagnosis not present

## 2020-09-13 DIAGNOSIS — N1831 Chronic kidney disease, stage 3a: Secondary | ICD-10-CM | POA: Diagnosis not present

## 2020-10-13 DIAGNOSIS — E782 Mixed hyperlipidemia: Secondary | ICD-10-CM | POA: Diagnosis not present

## 2020-10-13 DIAGNOSIS — E1100 Type 2 diabetes mellitus with hyperosmolarity without nonketotic hyperglycemic-hyperosmolar coma (NKHHC): Secondary | ICD-10-CM | POA: Diagnosis not present

## 2020-10-13 DIAGNOSIS — E1122 Type 2 diabetes mellitus with diabetic chronic kidney disease: Secondary | ICD-10-CM | POA: Diagnosis not present

## 2020-10-13 DIAGNOSIS — E1142 Type 2 diabetes mellitus with diabetic polyneuropathy: Secondary | ICD-10-CM | POA: Diagnosis not present

## 2020-10-13 DIAGNOSIS — E7849 Other hyperlipidemia: Secondary | ICD-10-CM | POA: Diagnosis not present

## 2020-10-13 DIAGNOSIS — I10 Essential (primary) hypertension: Secondary | ICD-10-CM | POA: Diagnosis not present

## 2020-10-13 DIAGNOSIS — Z1329 Encounter for screening for other suspected endocrine disorder: Secondary | ICD-10-CM | POA: Diagnosis not present

## 2020-10-14 DIAGNOSIS — N1831 Chronic kidney disease, stage 3a: Secondary | ICD-10-CM | POA: Diagnosis not present

## 2020-10-14 DIAGNOSIS — E7849 Other hyperlipidemia: Secondary | ICD-10-CM | POA: Diagnosis not present

## 2020-10-14 DIAGNOSIS — I129 Hypertensive chronic kidney disease with stage 1 through stage 4 chronic kidney disease, or unspecified chronic kidney disease: Secondary | ICD-10-CM | POA: Diagnosis not present

## 2020-10-14 DIAGNOSIS — E1122 Type 2 diabetes mellitus with diabetic chronic kidney disease: Secondary | ICD-10-CM | POA: Diagnosis not present

## 2020-10-19 DIAGNOSIS — Z0001 Encounter for general adult medical examination with abnormal findings: Secondary | ICD-10-CM | POA: Diagnosis not present

## 2020-10-19 DIAGNOSIS — I10 Essential (primary) hypertension: Secondary | ICD-10-CM | POA: Diagnosis not present

## 2020-10-19 DIAGNOSIS — I25111 Atherosclerotic heart disease of native coronary artery with angina pectoris with documented spasm: Secondary | ICD-10-CM | POA: Diagnosis not present

## 2020-10-19 DIAGNOSIS — E1142 Type 2 diabetes mellitus with diabetic polyneuropathy: Secondary | ICD-10-CM | POA: Diagnosis not present

## 2020-10-19 DIAGNOSIS — E1122 Type 2 diabetes mellitus with diabetic chronic kidney disease: Secondary | ICD-10-CM | POA: Diagnosis not present

## 2020-10-19 DIAGNOSIS — I5032 Chronic diastolic (congestive) heart failure: Secondary | ICD-10-CM | POA: Diagnosis not present

## 2020-10-19 DIAGNOSIS — E7849 Other hyperlipidemia: Secondary | ICD-10-CM | POA: Diagnosis not present

## 2020-12-15 DIAGNOSIS — N1831 Chronic kidney disease, stage 3a: Secondary | ICD-10-CM | POA: Diagnosis not present

## 2020-12-15 DIAGNOSIS — I129 Hypertensive chronic kidney disease with stage 1 through stage 4 chronic kidney disease, or unspecified chronic kidney disease: Secondary | ICD-10-CM | POA: Diagnosis not present

## 2020-12-15 DIAGNOSIS — E1122 Type 2 diabetes mellitus with diabetic chronic kidney disease: Secondary | ICD-10-CM | POA: Diagnosis not present

## 2020-12-15 DIAGNOSIS — E7849 Other hyperlipidemia: Secondary | ICD-10-CM | POA: Diagnosis not present

## 2021-01-14 DIAGNOSIS — E7849 Other hyperlipidemia: Secondary | ICD-10-CM | POA: Diagnosis not present

## 2021-01-14 DIAGNOSIS — N1831 Chronic kidney disease, stage 3a: Secondary | ICD-10-CM | POA: Diagnosis not present

## 2021-01-14 DIAGNOSIS — E1122 Type 2 diabetes mellitus with diabetic chronic kidney disease: Secondary | ICD-10-CM | POA: Diagnosis not present

## 2021-01-14 DIAGNOSIS — I129 Hypertensive chronic kidney disease with stage 1 through stage 4 chronic kidney disease, or unspecified chronic kidney disease: Secondary | ICD-10-CM | POA: Diagnosis not present

## 2021-01-24 DIAGNOSIS — E1142 Type 2 diabetes mellitus with diabetic polyneuropathy: Secondary | ICD-10-CM | POA: Diagnosis not present

## 2021-01-24 DIAGNOSIS — N183 Chronic kidney disease, stage 3 unspecified: Secondary | ICD-10-CM | POA: Diagnosis not present

## 2021-01-24 DIAGNOSIS — I10 Essential (primary) hypertension: Secondary | ICD-10-CM | POA: Diagnosis not present

## 2021-01-24 DIAGNOSIS — E1122 Type 2 diabetes mellitus with diabetic chronic kidney disease: Secondary | ICD-10-CM | POA: Diagnosis not present

## 2021-01-24 DIAGNOSIS — E7849 Other hyperlipidemia: Secondary | ICD-10-CM | POA: Diagnosis not present

## 2021-01-24 DIAGNOSIS — K2100 Gastro-esophageal reflux disease with esophagitis, without bleeding: Secondary | ICD-10-CM | POA: Diagnosis not present

## 2021-01-27 DIAGNOSIS — I10 Essential (primary) hypertension: Secondary | ICD-10-CM | POA: Diagnosis not present

## 2021-01-27 DIAGNOSIS — D692 Other nonthrombocytopenic purpura: Secondary | ICD-10-CM | POA: Diagnosis not present

## 2021-01-27 DIAGNOSIS — E1142 Type 2 diabetes mellitus with diabetic polyneuropathy: Secondary | ICD-10-CM | POA: Diagnosis not present

## 2021-01-27 DIAGNOSIS — E7849 Other hyperlipidemia: Secondary | ICD-10-CM | POA: Diagnosis not present

## 2021-01-27 DIAGNOSIS — E1122 Type 2 diabetes mellitus with diabetic chronic kidney disease: Secondary | ICD-10-CM | POA: Diagnosis not present

## 2021-01-27 DIAGNOSIS — Z23 Encounter for immunization: Secondary | ICD-10-CM | POA: Diagnosis not present

## 2021-03-16 DIAGNOSIS — I129 Hypertensive chronic kidney disease with stage 1 through stage 4 chronic kidney disease, or unspecified chronic kidney disease: Secondary | ICD-10-CM | POA: Diagnosis not present

## 2021-03-16 DIAGNOSIS — E1122 Type 2 diabetes mellitus with diabetic chronic kidney disease: Secondary | ICD-10-CM | POA: Diagnosis not present

## 2021-03-16 DIAGNOSIS — N1831 Chronic kidney disease, stage 3a: Secondary | ICD-10-CM | POA: Diagnosis not present

## 2021-03-16 DIAGNOSIS — E7849 Other hyperlipidemia: Secondary | ICD-10-CM | POA: Diagnosis not present

## 2021-04-05 DIAGNOSIS — E7849 Other hyperlipidemia: Secondary | ICD-10-CM | POA: Diagnosis not present

## 2021-04-05 DIAGNOSIS — D511 Vitamin B12 deficiency anemia due to selective vitamin B12 malabsorption with proteinuria: Secondary | ICD-10-CM | POA: Diagnosis not present

## 2021-04-05 DIAGNOSIS — E1142 Type 2 diabetes mellitus with diabetic polyneuropathy: Secondary | ICD-10-CM | POA: Diagnosis not present

## 2021-04-05 DIAGNOSIS — Z1329 Encounter for screening for other suspected endocrine disorder: Secondary | ICD-10-CM | POA: Diagnosis not present

## 2021-04-05 DIAGNOSIS — K2100 Gastro-esophageal reflux disease with esophagitis, without bleeding: Secondary | ICD-10-CM | POA: Diagnosis not present

## 2021-04-05 DIAGNOSIS — D649 Anemia, unspecified: Secondary | ICD-10-CM | POA: Diagnosis not present

## 2021-04-05 DIAGNOSIS — E1122 Type 2 diabetes mellitus with diabetic chronic kidney disease: Secondary | ICD-10-CM | POA: Diagnosis not present

## 2021-04-05 DIAGNOSIS — I10 Essential (primary) hypertension: Secondary | ICD-10-CM | POA: Diagnosis not present

## 2021-04-05 DIAGNOSIS — D529 Folate deficiency anemia, unspecified: Secondary | ICD-10-CM | POA: Diagnosis not present

## 2021-04-08 DIAGNOSIS — Z23 Encounter for immunization: Secondary | ICD-10-CM | POA: Diagnosis not present

## 2021-04-08 DIAGNOSIS — E1142 Type 2 diabetes mellitus with diabetic polyneuropathy: Secondary | ICD-10-CM | POA: Diagnosis not present

## 2021-04-08 DIAGNOSIS — I25111 Atherosclerotic heart disease of native coronary artery with angina pectoris with documented spasm: Secondary | ICD-10-CM | POA: Diagnosis not present

## 2021-04-08 DIAGNOSIS — I10 Essential (primary) hypertension: Secondary | ICD-10-CM | POA: Diagnosis not present

## 2021-04-08 DIAGNOSIS — E1122 Type 2 diabetes mellitus with diabetic chronic kidney disease: Secondary | ICD-10-CM | POA: Diagnosis not present

## 2021-04-08 DIAGNOSIS — Z0001 Encounter for general adult medical examination with abnormal findings: Secondary | ICD-10-CM | POA: Diagnosis not present

## 2021-04-08 DIAGNOSIS — I5032 Chronic diastolic (congestive) heart failure: Secondary | ICD-10-CM | POA: Diagnosis not present

## 2021-04-13 DIAGNOSIS — I2510 Atherosclerotic heart disease of native coronary artery without angina pectoris: Secondary | ICD-10-CM | POA: Diagnosis not present

## 2021-04-13 DIAGNOSIS — N1831 Chronic kidney disease, stage 3a: Secondary | ICD-10-CM | POA: Diagnosis not present

## 2021-04-13 DIAGNOSIS — R0902 Hypoxemia: Secondary | ICD-10-CM | POA: Diagnosis not present

## 2021-04-13 DIAGNOSIS — R600 Localized edema: Secondary | ICD-10-CM | POA: Diagnosis not present

## 2021-04-13 DIAGNOSIS — Z20822 Contact with and (suspected) exposure to covid-19: Secondary | ICD-10-CM | POA: Diagnosis not present

## 2021-04-13 DIAGNOSIS — Z794 Long term (current) use of insulin: Secondary | ICD-10-CM | POA: Diagnosis not present

## 2021-04-13 DIAGNOSIS — E782 Mixed hyperlipidemia: Secondary | ICD-10-CM | POA: Diagnosis not present

## 2021-04-13 DIAGNOSIS — K219 Gastro-esophageal reflux disease without esophagitis: Secondary | ICD-10-CM | POA: Diagnosis not present

## 2021-04-13 DIAGNOSIS — L03116 Cellulitis of left lower limb: Secondary | ICD-10-CM | POA: Diagnosis not present

## 2021-04-13 DIAGNOSIS — I10 Essential (primary) hypertension: Secondary | ICD-10-CM | POA: Diagnosis not present

## 2021-04-13 DIAGNOSIS — E1122 Type 2 diabetes mellitus with diabetic chronic kidney disease: Secondary | ICD-10-CM | POA: Diagnosis not present

## 2021-04-13 DIAGNOSIS — R509 Fever, unspecified: Secondary | ICD-10-CM | POA: Diagnosis not present

## 2021-04-13 DIAGNOSIS — Z743 Need for continuous supervision: Secondary | ICD-10-CM | POA: Diagnosis not present

## 2021-04-13 DIAGNOSIS — I499 Cardiac arrhythmia, unspecified: Secondary | ICD-10-CM | POA: Diagnosis not present

## 2021-04-13 DIAGNOSIS — I129 Hypertensive chronic kidney disease with stage 1 through stage 4 chronic kidney disease, or unspecified chronic kidney disease: Secondary | ICD-10-CM | POA: Diagnosis not present

## 2021-04-13 DIAGNOSIS — R609 Edema, unspecified: Secondary | ICD-10-CM | POA: Diagnosis not present

## 2021-04-13 DIAGNOSIS — A419 Sepsis, unspecified organism: Secondary | ICD-10-CM | POA: Diagnosis not present

## 2021-05-13 DIAGNOSIS — L03116 Cellulitis of left lower limb: Secondary | ICD-10-CM | POA: Diagnosis not present

## 2021-06-14 DIAGNOSIS — I209 Angina pectoris, unspecified: Secondary | ICD-10-CM | POA: Diagnosis not present

## 2021-07-15 DIAGNOSIS — I129 Hypertensive chronic kidney disease with stage 1 through stage 4 chronic kidney disease, or unspecified chronic kidney disease: Secondary | ICD-10-CM | POA: Diagnosis not present

## 2021-07-15 DIAGNOSIS — E1122 Type 2 diabetes mellitus with diabetic chronic kidney disease: Secondary | ICD-10-CM | POA: Diagnosis not present

## 2021-07-28 ENCOUNTER — Encounter: Payer: Self-pay | Admitting: Cardiology

## 2021-07-28 ENCOUNTER — Encounter: Payer: Self-pay | Admitting: *Deleted

## 2021-07-28 ENCOUNTER — Ambulatory Visit: Payer: Medicare Other | Admitting: Cardiology

## 2021-07-28 VITALS — BP 144/80 | HR 74 | Ht 66.0 in | Wt 197.8 lb

## 2021-07-28 DIAGNOSIS — I10 Essential (primary) hypertension: Secondary | ICD-10-CM

## 2021-07-28 DIAGNOSIS — E782 Mixed hyperlipidemia: Secondary | ICD-10-CM

## 2021-07-28 DIAGNOSIS — R079 Chest pain, unspecified: Secondary | ICD-10-CM

## 2021-07-28 DIAGNOSIS — R0789 Other chest pain: Secondary | ICD-10-CM

## 2021-07-28 DIAGNOSIS — I2510 Atherosclerotic heart disease of native coronary artery without angina pectoris: Secondary | ICD-10-CM | POA: Diagnosis not present

## 2021-07-28 NOTE — Progress Notes (Signed)
? ? ? ?Clinical Summary ?[name] is a 87 y.o.male seen today as a new consult, referred by Dr Reuel Boom for the following medical problems.  ? ?1.CAD ?- history of BMS to RCA in 07/2005 ?- isolated episode of chest pain ?- was working removing limbs from the yard about 1.5 months ago, carrying 2 heavy limbs developed chest pain ?- midchest, aching pain 10/10 in severity. No other associated symptoms. Walked back to house and layed down, resolved after 10 minutes.  ?- sedentary lifestyle due to chronic back pains.  ? ? ? ?2. Carotid stenosis ?- left CEA 12/2010 ?- followed by vascular though appears has not seen in a few years ?2019 [place] RICA 1-39%, normal ICA ? ?3. Hyperlipidemia ?- he is on simvastatin 80mg  ?- labs followed by pcp ? ? ?4. LE edema ?- on diuretic within the last year.  ?Past Medical History:  ?Diagnosis Date  ? Anxiety   ? CAD (coronary artery disease)   ? Carotid artery occlusion   ? CKD (chronic kidney disease), stage III (HCC)   ? Diabetes mellitus   ? Hyperlipidemia   ? Hypertension   ? Leg pain   ? with walking  ? ? ? ?Allergies  ?Allergen Reactions  ? Omnipaque [Iohexol]   ? Paxil [Paroxetine Hcl]   ?  Unknown reaction, on file with [facility]  ? Topamax [Topiramate]   ?  Unknown reaction, on file with [facility]  ? Codeine Nausea And Vomiting  ? ? ? ?Current Outpatient Medications  ?Medication Sig Dispense Refill  ? aspirin EC 81 MG tablet Take 81 mg by mouth daily.    ? carvedilol (COREG) 12.5 MG tablet Take 1 tablet (12.5 mg total) by mouth 2 (two) times daily with a meal. 60 tablet 0  ? gabapentin (NEURONTIN) 300 MG capsule Take 3 tablets twice a day 540 capsule 3  ? glipiZIDE (GLUCOTROL) 5 MG tablet Take 5 mg by mouth 2 (two) times daily.  0  ? levETIRAcetam (KEPPRA) 500 MG tablet Take 500 mg by mouth 2 (two) times daily.    ? lisinopril (PRINIVIL,ZESTRIL) 5 MG tablet Take 1 tablet by mouth daily.    ? metFORMIN (GLUCOPHAGE-XR) 500 MG 24 hr tablet Take 1,000 mg by mouth 2 (two) times daily.  0   ? omeprazole (PRILOSEC) 20 MG capsule Take 40 mg by mouth daily.   3  ? simvastatin (ZOCOR) 80 MG tablet Take 40 mg by mouth at bedtime.     ? traZODone (DESYREL) 50 MG tablet Take 50 mg by mouth at bedtime.     ? ?No current facility-administered medications for this visit.  ? ? ? ?Past Surgical History:  ?Procedure Laterality Date  ? BACK SURGERY  07/10/11  ? CAROTID ENDARTERECTOMY  01/10/11  ? Left Carotid  ? CORONARY STENT PLACEMENT  08/09/2005  ? Right  ? SPINE SURGERY  07/10/11  ? ? ? ?Allergies  ?Allergen Reactions  ? Omnipaque [Iohexol]   ? Paxil [Paroxetine Hcl]   ?  Unknown reaction, on [facility] with [facility]  ? Topamax [Topiramate]   ?  Unknown reaction, on file with [facility]  ? Codeine Nausea And Vomiting  ? ? ? ? ?Family History  ?Problem Relation Age of Onset  ? Hypertension Mother   ? Deep vein thrombosis Mother   ? Diabetes Mother   ? Heart disease Mother   ? COPD Father   ? Heart disease Sister   ? Heart attack Sister   ?  Bypass  ? Heart disease Brother   ?     Heart Disease before age 85  ? Heart disease Brother   ? Hypertension Son   ? ? ? ?Social History ?[name] reports that he does not have a smoking history on file. He has never used smokeless tobacco. ?[name] reports no history of alcohol use. ? ? ?Review of Systems ?CONSTITUTIONAL: No weight loss, fever, chills, weakness or fatigue.  ?HEENT: Eyes: No visual loss, blurred vision, double vision or yellow sclerae.No hearing loss, sneezing, congestion, runny nose or sore throat.  ?SKIN: No rash or itching.  ?CARDIOVASCULAR: per hpi ?RESPIRATORY: No shortness of breath, cough or sputum.  ?GASTROINTESTINAL: No anorexia, nausea, vomiting or diarrhea. No abdominal pain or blood.  ?GENITOURINARY: No burning on urination, no polyuria ?NEUROLOGICAL: No headache, dizziness, syncope, paralysis, ataxia, numbness or tingling in the extremities. No change in bowel or bladder control.  ?MUSCULOSKELETAL: No muscle, back pain, joint pain or stiffness.   ?LYMPHATICS: No enlarged nodes. No history of splenectomy.  ?PSYCHIATRIC: No history of depression or anxiety.  ?ENDOCRINOLOGIC: No reports of sweating, cold or heat intolerance. No polyuria or polydipsia.  ?. ? ? ?Physical Examination ?Today's Vitals  ? 07/28/21 0852  ?BP: (!) 144/80  ?Pulse: 74  ?SpO2: 98%  ?Weight: 197 lb 12.8 oz (89.7 kg)  ?Height: 5\' 6"  (1.676 m)  ? ?Body mass index is 31.93 kg/m?. ? ?Gen: resting comfortably, no acute distress ?HEENT: no scleral icterus, pupils equal round and reactive, no palptable cervical adenopathy,  ?CV: RRR, no m/r/g, no jvd ?Resp: Clear to auscultation bilaterally ?GI: abdomen is soft, non-tender, non-distended, normal bowel sounds, no hepatosplenomegaly ?MSK: extremities are warm, no edema.  ?Skin: warm, no rash ?Neuro:  no focal deficits ?Psych: appropriate affect ? ? ?Diagnostic Studies ? ?2007 cath ? ASSESSMENT: ? 1. Diffuse 3 vessel coronary artery disease. ?     a.     Calcified, non obstructive disease throughout the LAD. ?     b.     Small left circumflex with high grade osteal stenosis and an ?      occluded 3rd obtuse marginal that is well collateralized from left to ?      left collaterals. ?     c.     Diffusely diseased right coronary artery with a widely patent ?      stent in the mid right coronary artery.  There is severe diffuse ?      disease of the PDA which is a small diameter vessel. ? 2. Normal left ventricular function. ?  ?Post cath  PLAN:  The patient has no significant changes since his last catheterization ? back in April of this year.  His stent in the mid right coronary artery is ? widely patent.  He is not having dramatic, life style limiting symptoms at ? this point.  I think his inferior wall ischemia is secondary to his PDA ? disease.  If he develops progressive symptoms that are refractory to medical ? therapy, it would be reasonable to perform PCI on his PDA.  However, it ? would involve a long, bare metal stent as it is an area of  diffuse disease ? and it is a small diameter vessel.  I think the long term results of ? intervening on his PDA would not be very good.  Because of that, I would ? start with medical therapy and only perform PCI if we are unable to control ? his  symptoms. ? ? ?Assessment and Plan  ?1.CAD ?- isolated episode of exertional angina. Occurred with higher level of exertion than is his norm, typically sedentary due to chronic back pain ?- prior history of CAD with stent in 2007 ?- plan for lexiscan for further risk stratification. If low to intermediate risk would likely just manage medically given symptoms isolated to high exertion, if high risk consider cath ?- EKG from pcp shows SR, no ischeimc changes ? ?2. HTN ?- elevated here, has not taken meds today. He will call Monday with home bp's. Likely add norvasc if elevated, last labs showed some CKD and elevated K, would be reluctant to increase lisinopril ? ?3. Hyperlipidemia ?- request pcp labs ? ? ? ? ? ?Antoine Poche, M.D. ?

## 2021-07-28 NOTE — Patient Instructions (Addendum)
Medication Instructions:  ?Continue all current medications. ? ?Labwork: ?none ? ?Testing/Procedures: ?Your physician has requested that you have a lexiscan myoview. For further information please visit [email or web address] Please follow instruction sheet, as given. ?Office will contact with results via phone or letter.    ? ?Follow-Up: ?Pending test results  ? ?Any Other Special Instructions Will Be Listed Below (If Applicable). ?Please call the office on Monday with update on blood pressure readings.  ? ?If you need a refill on your cardiac medications before your next appointment, please call your pharmacy. ? ?

## 2021-08-03 ENCOUNTER — Encounter: Payer: Self-pay | Admitting: Cardiology

## 2021-08-08 NOTE — Telephone Encounter (Signed)
Overall bp's ok, no changes ? ? ?Dominga Ferry MD ?

## 2021-08-09 ENCOUNTER — Ambulatory Visit (HOSPITAL_COMMUNITY)
Admission: RE | Admit: 2021-08-09 | Discharge: 2021-08-09 | Disposition: A | Discharge status: Home / Self Care | Payer: Medicare Other | Source: Ambulatory Visit | Attending: Cardiology | Admitting: Cardiology

## 2021-08-09 ENCOUNTER — Encounter (HOSPITAL_COMMUNITY): Payer: Self-pay

## 2021-08-09 DIAGNOSIS — R0789 Other chest pain: Secondary | ICD-10-CM | POA: Diagnosis not present

## 2021-08-09 DIAGNOSIS — R079 Chest pain, unspecified: Secondary | ICD-10-CM

## 2021-08-09 LAB — NM MYOCAR MULTI W/SPECT W/WALL MOTION / EF
Base ST Depression (mm): 0 mm
LV dias vol: 75 mL (ref 62–150)
LV sys vol: 30 mL
Nuc Stress EF: 60 %
Peak HR: 123 {beats}/min
RATE: 0.4
Rest HR: 67 {beats}/min
Rest Nuclear Isotope Dose: 10 mCi
SDS: 1
SRS: 1
SSS: 2
ST Depression (mm): 0 mm
Stress Nuclear Isotope Dose: 28.7 mCi
TID: 1.05

## 2021-08-09 MED ORDER — SODIUM CHLORIDE FLUSH 0.9 % IV SOLN
INTRAVENOUS | Status: AC
  Administered 2021-08-09: 10 mL
  Filled 2021-08-09: qty 10

## 2021-08-09 MED ORDER — TECHNETIUM TC 99M TETROFOSMIN IV KIT
30.0000 | PACK | Freq: Once | INTRAVENOUS | Status: AC | PRN
  Administered 2021-08-09: 28.7 via INTRAVENOUS

## 2021-08-09 MED ORDER — REGADENOSON 0.4 MG/5ML IV SOLN
INTRAVENOUS | Status: AC
Start: 2021-08-09 — End: 2021-08-09
  Administered 2021-08-09: 0.4 mg
  Filled 2021-08-09: qty 5

## 2021-08-09 MED ORDER — TECHNETIUM TC 99M TETROFOSMIN IV KIT
10.0000 | PACK | Freq: Once | INTRAVENOUS | Status: AC | PRN
  Administered 2021-08-09: 10 via INTRAVENOUS

## 2021-08-23 ENCOUNTER — Telehealth: Payer: Self-pay | Admitting: Cardiology

## 2021-08-23 DIAGNOSIS — I25111 Atherosclerotic heart disease of native coronary artery with angina pectoris with documented spasm: Secondary | ICD-10-CM | POA: Diagnosis not present

## 2021-08-23 DIAGNOSIS — I10 Essential (primary) hypertension: Secondary | ICD-10-CM | POA: Diagnosis not present

## 2021-08-23 DIAGNOSIS — D509 Iron deficiency anemia, unspecified: Secondary | ICD-10-CM | POA: Diagnosis not present

## 2021-08-23 DIAGNOSIS — E1142 Type 2 diabetes mellitus with diabetic polyneuropathy: Secondary | ICD-10-CM | POA: Diagnosis not present

## 2021-08-23 DIAGNOSIS — M2550 Pain in unspecified joint: Secondary | ICD-10-CM | POA: Diagnosis not present

## 2021-08-23 DIAGNOSIS — E1122 Type 2 diabetes mellitus with diabetic chronic kidney disease: Secondary | ICD-10-CM | POA: Diagnosis not present

## 2021-08-23 DIAGNOSIS — I5032 Chronic diastolic (congestive) heart failure: Secondary | ICD-10-CM | POA: Diagnosis not present

## 2021-08-23 DIAGNOSIS — E782 Mixed hyperlipidemia: Secondary | ICD-10-CM | POA: Diagnosis not present

## 2021-08-23 DIAGNOSIS — G6289 Other specified polyneuropathies: Secondary | ICD-10-CM | POA: Diagnosis not present

## 2021-08-23 NOTE — Telephone Encounter (Signed)
Received referral from PCP that pt is having chest pain was seen today by Dr. Reuel Boom ? ?Please advise if pt needs appointment  ? ?Notes in given to clinical  ?

## 2021-08-24 NOTE — Telephone Encounter (Signed)
Referral department contacted at [facility] informed and verbalized understanding of plan. ?

## 2021-08-24 NOTE — Telephone Encounter (Signed)
Normal stress test just last month, needs to f/u with pcp to consider noncardiac causes of chest pain. Please forward stress results to pcp ? ?Dominga Ferry MD ?

## 2021-09-01 DIAGNOSIS — L570 Actinic keratosis: Secondary | ICD-10-CM | POA: Diagnosis not present

## 2021-10-04 DIAGNOSIS — E119 Type 2 diabetes mellitus without complications: Secondary | ICD-10-CM | POA: Diagnosis not present

## 2021-10-04 DIAGNOSIS — Z961 Presence of intraocular lens: Secondary | ICD-10-CM | POA: Diagnosis not present

## 2021-10-04 DIAGNOSIS — H26491 Other secondary cataract, right eye: Secondary | ICD-10-CM | POA: Diagnosis not present

## 2021-10-04 DIAGNOSIS — H35371 Puckering of macula, right eye: Secondary | ICD-10-CM | POA: Diagnosis not present

## 2021-10-07 ENCOUNTER — Encounter (INDEPENDENT_AMBULATORY_CARE_PROVIDER_SITE_OTHER): Payer: Self-pay

## 2021-10-19 ENCOUNTER — Ambulatory Visit (INDEPENDENT_AMBULATORY_CARE_PROVIDER_SITE_OTHER): Payer: Medicare Other | Admitting: Ophthalmology

## 2021-10-19 DIAGNOSIS — H35371 Puckering of macula, right eye: Secondary | ICD-10-CM | POA: Diagnosis not present

## 2021-10-19 DIAGNOSIS — E119 Type 2 diabetes mellitus without complications: Secondary | ICD-10-CM | POA: Diagnosis not present

## 2021-10-19 NOTE — Assessment & Plan Note (Signed)
No detectable diabetic retinopathy OU 

## 2021-10-19 NOTE — Progress Notes (Signed)
10/19/2021     CHIEF COMPLAINT Patient presents for  Chief Complaint  Patient presents with   Retina Evaluation      HISTORY OF PRESENT ILLNESS: [name] is a 87 y.o. male who presents to the clinic today for:   HPI     Retina Evaluation           Associated Symptoms: Floaters and Distortion.  Negative for Flashes, Blind Spot, Pain, Redness, Photophobia, Glare, Trauma, Scalp Tenderness, Jaw Claudication, Shoulder/Hip pain, Fever, Weight Loss and Fatigue         Comments   NP-ERM-OD Vision only correct for 20/50, ref by groat.  Pt states his vision in the right eye "is very blurry and wavy and sometimes see's double vision"  Pt states when "he closes the right eye and looks out the left he can see [place] just fine and when he closes the right eye she seem distorted"  Pt states this has been going on for a while now  Pt states he does see floaters in both eyes   Pt stated he had cataract's done about 21 years ago by Dr. Ernesto Rutherford      Last edited by Aleene Davidson, CMA on 10/19/2021  9:02 AM.      Referring physician: Ernesto Rutherford, MD [address] [place],  [place] [address]  HISTORICAL INFORMATION:   Selected notes from the MEDICAL RECORD NUMBER    Lab Results  Component Value Date   HGBA1C 7.8 (H) 05/05/2013     CURRENT MEDICATIONS: No current outpatient medications on file. (Ophthalmic Drugs)   No current facility-administered medications for this visit. (Ophthalmic Drugs)   Current Outpatient Medications (Other)  Medication Sig   aspirin EC 81 MG tablet Take 81 mg by mouth daily.   carvedilol (COREG) 25 MG tablet Take 25 mg by mouth 2 (two) times daily with a meal.   ferrous sulfate 325 (65 FE) MG tablet Take 325 mg by mouth daily with breakfast.   furosemide (LASIX) 40 MG tablet Take 1 tablet by mouth daily.   gabapentin (NEURONTIN) 300 MG capsule Take 3 tablets twice a day   glipiZIDE (GLUCOTROL) 5 MG tablet Take 5 mg by mouth 2  (two) times daily.   levETIRAcetam (KEPPRA) 500 MG tablet Take 500 mg by mouth 2 (two) times daily.   lisinopril (PRINIVIL,ZESTRIL) 5 MG tablet Take 1 tablet by mouth daily.   metFORMIN (GLUCOPHAGE-XR) 500 MG 24 hr tablet Take 1,000 mg by mouth 2 (two) times daily.   omeprazole (PRILOSEC) 40 MG capsule Take 40 mg by mouth daily.   simvastatin (ZOCOR) 80 MG tablet Take 40 mg by mouth at bedtime.    No current facility-administered medications for this visit. (Other)      REVIEW OF SYSTEMS: ROS   Negative for: Constitutional, Gastrointestinal, Neurological, Skin, Genitourinary, Musculoskeletal, HENT, Endocrine, Cardiovascular, Eyes, Respiratory, Psychiatric, Allergic/Imm, Heme/Lymph Last edited by Aleene Davidson, CMA on 10/19/2021  9:02 AM.       ALLERGIES Allergies  Allergen Reactions   Omnipaque [Iohexol]    Paxil [Paroxetine Hcl]     Unknown reaction, on file with [facility]   Topamax [Topiramate]     Unknown reaction, on file with [facility]   Codeine Nausea And Vomiting    PAST MEDICAL HISTORY Past Medical History:  Diagnosis Date   Anxiety    CAD (coronary artery disease)    Carotid artery occlusion    CKD (chronic kidney disease), stage  III (HCC)    Diabetes mellitus    Hyperlipidemia    Hypertension    Leg pain    with walking   Past Surgical History:  Procedure Laterality Date   BACK SURGERY  07/10/11   CAROTID ENDARTERECTOMY  01/10/11   Left Carotid   CORONARY STENT PLACEMENT  08/09/2005   Right   SPINE SURGERY  07/10/11    FAMILY HISTORY Family History  Problem Relation Age of Onset   Hypertension Mother    Deep vein thrombosis Mother    Diabetes Mother    Heart disease Mother    COPD Father    Heart disease Sister    Heart attack Sister        Bypass   Heart disease Brother        Heart Disease before age 78   Heart disease Brother    Hypertension Son     SOCIAL HISTORY Social History   Tobacco Use   Smoking status: Former    Types:  Cigarettes    Quit date: 04/17/1981    Years since quitting: 40.5   Smokeless tobacco: Never  Substance Use Topics   Alcohol use: No    Alcohol/week: 0.0 standard drinks of alcohol   Drug use: No         OPHTHALMIC EXAM:  Base Eye Exam     Visual Acuity (ETDRS)       Right Left   Dist [place] 20/100 20/40   Dist ph [place] NI 20/25         Tonometry (Tonopen, 9:10 AM)       Right Left   Pressure 21 13         Extraocular Movement       Right Left    Full Full         Neuro/Psych     Oriented x3: Yes         Dilation     Both eyes: 1.0% Mydriacyl, 2.5% Phenylephrine @ 9:10 AM           Slit Lamp and Fundus Exam     External Exam       Right Left   External Normal Normal         Slit Lamp Exam       Right Left   Lids/Lashes 1+ Dermatochalasis - upper lid 1+ Dermatochalasis - upper lid   Conjunctiva/Sclera White and quiet White and quiet   Cornea Clear Clear   Anterior Chamber Deep and quiet Deep and quiet   Iris Round and reactive Round and reactive   Lens Posterior chamber intraocular lens, Centered posterior chamber intraocular lens Posterior chamber intraocular lens, Centered posterior chamber intraocular lens   Anterior Vitreous Normal Normal         Fundus Exam       Right Left   Posterior Vitreous Posterior vitreous detachment Posterior vitreous detachment   Disc Normal Normal   C/D Ratio 0.35 0.35   Macula Macular thickening, Epiretinal membrane with severe topographic distortion Normal   Vessels Normal Normal   Periphery Normal Normal            IMAGING AND PROCEDURES  Imaging and Procedures for 10/19/21  OCT, Retina - OU - Both Eyes       Right Eye Quality was good. Scan locations included subfoveal. Progression has no prior data. Findings include abnormal foveal contour, epiretinal membrane.   Left Eye Quality was good. Scan locations included subfoveal. Progression has  no prior data. Findings include normal  foveal contour.   Notes Severe macular thickening from epiretinal membrane photos reviewed with patient with explanation.      Color Fundus Photography Optos - OU - Both Eyes       Right Eye Progression has no prior data. Disc findings include normal observations. Macula : epiretinal membrane. Vessels : normal observations. Periphery : normal observations.   Left Eye Progression has no prior data. Disc findings include normal observations. Macula : normal observations. Vessels : normal observations. Periphery : normal observations.   Notes OD with incidental PVD, severe topographic distortion macula seen on color photos OD              ASSESSMENT/PLAN:  Diabetes mellitus without complication (HCC) No detectable diabetic retinopathy OU  Epiretinal membrane, right eye Severe topographic distortion and macular thickening of the right eye secondary to epiretinal membrane.  We will require surgical intervention via vitrectomy, membrane peel, review of these findings features and the nature of the surgery were completed with the patient today     ICD-10-CM   1. Epiretinal membrane, right eye  H35.371 OCT, Retina - OU - Both Eyes    Color Fundus Photography Optos - OU - Both Eyes    2. Diabetes mellitus without complication (HCC)  E11.9       1.  OD with severe epiretinal membrane, will recommend an schedule vitrectomy membrane peel the right eye.  Patient will not require gas injection.  2.  Ashby Dawes of the surgery discussed with patient , risks and benefits reviewed.  3.  Ophthalmic Meds Ordered this visit:  No orders of the defined types were placed in this encounter.      Return ,, [facility], for Schedule vitrectomy membrane peel right eye, OD.  There are no Patient Instructions on file for this visit.   Explained the diagnoses, plan, and follow up with the patient and they expressed understanding.  Patient expressed understanding of the  importance of proper follow up care.   Alford Highland Roshini Fulwider M.D. Diseases & Surgery of the Retina and Vitreous [facility] 10/19/21     Abbreviations: M myopia (nearsighted); A astigmatism; H hyperopia (farsighted); P presbyopia; Mrx spectacle prescription;  CTL contact lenses; OD right eye; OS left eye; OU both eyes  XT exotropia; ET esotropia; PEK punctate epithelial keratitis; PEE punctate epithelial erosions; DES dry eye syndrome; MGD meibomian gland dysfunction; ATs artificial tears; PFAT's preservative free artificial tears; NSC nuclear sclerotic cataract; PSC posterior subcapsular cataract; ERM epi-retinal membrane; PVD posterior vitreous detachment; RD retinal detachment; DM diabetes mellitus; DR diabetic retinopathy; NPDR non-proliferative diabetic retinopathy; PDR proliferative diabetic retinopathy; CSME clinically significant macular edema; DME diabetic macular edema; dbh dot blot hemorrhages; CWS cotton wool spot; POAG primary open angle glaucoma; C/D cup-to-disc ratio; HVF humphrey visual field; GVF goldmann visual field; OCT optical coherence tomography; IOP intraocular pressure; BRVO Branch retinal vein occlusion; CRVO central retinal vein occlusion; CRAO central retinal artery occlusion; BRAO branch retinal artery occlusion; RT retinal tear; SB scleral buckle; PPV pars plana vitrectomy; VH Vitreous hemorrhage; PRP panretinal laser photocoagulation; IVK intravitreal kenalog; VMT vitreomacular traction; MH Macular hole;  NVD neovascularization of the disc; NVE neovascularization elsewhere; AREDS age related eye disease study; ARMD age related macular degeneration; POAG primary open angle glaucoma; EBMD epithelial/anterior basement membrane dystrophy; ACIOL anterior chamber intraocular lens; IOL intraocular lens; PCIOL posterior chamber intraocular lens; Phaco/IOL phacoemulsification with intraocular lens placement; PRK photorefractive keratectomy; LASIK  laser assisted in situ  keratomileusis; HTN hypertension; DM diabetes mellitus; COPD chronic obstructive pulmonary disease

## 2021-10-19 NOTE — Assessment & Plan Note (Signed)
Severe topographic distortion and macular thickening of the right eye secondary to epiretinal membrane.  We will require surgical intervention via vitrectomy, membrane peel, review of these findings features and the nature of the surgery were completed with the patient today

## 2021-10-24 ENCOUNTER — Encounter (INDEPENDENT_AMBULATORY_CARE_PROVIDER_SITE_OTHER): Payer: Self-pay | Admitting: Ophthalmology

## 2021-10-25 ENCOUNTER — Ambulatory Visit (INDEPENDENT_AMBULATORY_CARE_PROVIDER_SITE_OTHER): Payer: Medicare Other

## 2021-10-25 ENCOUNTER — Other Ambulatory Visit (INDEPENDENT_AMBULATORY_CARE_PROVIDER_SITE_OTHER): Payer: Self-pay

## 2021-10-25 MED ORDER — PREDNISOLONE ACETATE 1 % OP SUSP: 1.0000 [drp] | Freq: Four times a day (QID) | OPHTHALMIC | 0 refills | Status: AC

## 2021-10-25 MED ORDER — OFLOXACIN 0.3 % OP SOLN: 1.0000 [drp] | Freq: Four times a day (QID) | OPHTHALMIC | 0 refills | Status: AC

## 2021-11-02 ENCOUNTER — Encounter (AMBULATORY_SURGERY_CENTER): Payer: Medicare Other | Admitting: Ophthalmology

## 2021-11-02 DIAGNOSIS — H35371 Puckering of macula, right eye: Secondary | ICD-10-CM | POA: Diagnosis not present

## 2021-11-03 ENCOUNTER — Encounter (INDEPENDENT_AMBULATORY_CARE_PROVIDER_SITE_OTHER): Payer: Self-pay | Admitting: Ophthalmology

## 2021-11-03 ENCOUNTER — Ambulatory Visit (INDEPENDENT_AMBULATORY_CARE_PROVIDER_SITE_OTHER): Payer: Medicare Other | Admitting: Ophthalmology

## 2021-11-03 DIAGNOSIS — H35371 Puckering of macula, right eye: Secondary | ICD-10-CM

## 2021-11-03 NOTE — Assessment & Plan Note (Signed)
Postop day #1 looks great.  Much improved topography.  Clear vitreous clear media

## 2021-11-03 NOTE — Progress Notes (Signed)
11/03/2021     CHIEF COMPLAINT Patient presents for  Chief Complaint  Patient presents with   Post-op Follow-up      HISTORY OF PRESENT ILLNESS: [name] is a 87 y.o. male who presents to the clinic today for:   HPI     Post-op Follow-up           Laterality: right eye         Comments   1 day post op, post vitrectomy, membrane peel, ERM and ILM. Pt states he feels good      Last edited by Edmon Crape, MD on 11/03/2021  8:29 AM.      Referring physician: Practice, [facility] [address] [place],  [place] [address]  HISTORICAL INFORMATION:   Selected notes from the MEDICAL RECORD NUMBER    Lab Results  Component Value Date   HGBA1C 7.8 (H) 05/05/2013     CURRENT MEDICATIONS: Current Outpatient Medications (Ophthalmic Drugs)  Medication Sig   ofloxacin (OCUFLOX) 0.3 % ophthalmic solution Place 1 drop into the right eye 4 (four) times daily for 21 days.   prednisoLONE acetate (PRED FORTE) 1 % ophthalmic suspension Place 1 drop into the right eye 4 (four) times daily for 21 days.   No current facility-administered medications for this visit. (Ophthalmic Drugs)   Current Outpatient Medications (Other)  Medication Sig   aspirin EC 81 MG tablet Take 81 mg by mouth daily.   carvedilol (COREG) 25 MG tablet Take 25 mg by mouth 2 (two) times daily with a meal.   ferrous sulfate 325 (65 FE) MG tablet Take 325 mg by mouth daily with breakfast.   furosemide (LASIX) 40 MG tablet Take 1 tablet by mouth daily.   gabapentin (NEURONTIN) 300 MG capsule Take 3 tablets twice a day   glipiZIDE (GLUCOTROL) 5 MG tablet Take 5 mg by mouth 2 (two) times daily.   levETIRAcetam (KEPPRA) 500 MG tablet Take 500 mg by mouth 2 (two) times daily.   lisinopril (PRINIVIL,ZESTRIL) 5 MG tablet Take 1 tablet by mouth daily.   metFORMIN (GLUCOPHAGE-XR) 500 MG 24 hr tablet Take 1,000 mg by mouth 2 (two) times daily.   omeprazole (PRILOSEC) 40 MG capsule Take 40 mg by mouth  daily.   simvastatin (ZOCOR) 80 MG tablet Take 40 mg by mouth at bedtime.    No current facility-administered medications for this visit. (Other)      REVIEW OF SYSTEMS: ROS   Negative for: Constitutional, Gastrointestinal, Neurological, Skin, Genitourinary, Musculoskeletal, HENT, Endocrine, Cardiovascular, Eyes, Respiratory, Psychiatric, Allergic/Imm, Heme/Lymph Last edited by Aleene Davidson, CMA on 11/03/2021  8:05 AM.       ALLERGIES Allergies  Allergen Reactions   Omnipaque [Iohexol]    Paxil [Paroxetine Hcl]     Unknown reaction, on file with [facility]   Topamax [Topiramate]     Unknown reaction, on file with [facility]   Codeine Nausea And Vomiting    PAST MEDICAL HISTORY Past Medical History:  Diagnosis Date   Anxiety    CAD (coronary artery disease)    Carotid artery occlusion    CKD (chronic kidney disease), stage III (HCC)    Diabetes mellitus    Hyperlipidemia    Hypertension    Leg pain    with walking   Past Surgical History:  Procedure Laterality Date   BACK SURGERY  07/10/11   CAROTID ENDARTERECTOMY  01/10/11   Left Carotid   CORONARY STENT PLACEMENT  08/09/2005   Right  SPINE SURGERY  07/10/11    FAMILY HISTORY Family History  Problem Relation Age of Onset   Hypertension Mother    Deep vein thrombosis Mother    Diabetes Mother    Heart disease Mother    COPD Father    Heart disease Sister    Heart attack Sister        Bypass   Heart disease Brother        Heart Disease before age 48   Heart disease Brother    Hypertension Son     SOCIAL HISTORY Social History   Tobacco Use   Smoking status: Former    Types: Cigarettes    Quit date: 04/17/1981    Years since quitting: 40.5   Smokeless tobacco: Never  Substance Use Topics   Alcohol use: No    Alcohol/week: 0.0 standard drinks of alcohol   Drug use: No         OPHTHALMIC EXAM:  Base Eye Exam     Visual Acuity (ETDRS)       Right Left   Dist [place] 20/100 20/30          Tonometry (Tonopen, 8:08 AM)       Right Left   Pressure 14 11         Neuro/Psych     Oriented x3: Yes         Dilation     Left eye:            Slit Lamp and Fundus Exam     External Exam       Right Left   External Normal Normal         Slit Lamp Exam       Right Left   Lids/Lashes 1+ Dermatochalasis - upper lid 1+ Dermatochalasis - upper lid   Conjunctiva/Sclera Small Sub conjunctival hemorrhage nonbullous inferior White and quiet   Cornea Clear Clear   Anterior Chamber Deep and quiet Deep and quiet   Iris Round and reactive Round and reactive   Lens Posterior chamber intraocular lens, Centered posterior chamber intraocular lens Posterior chamber intraocular lens, Centered posterior chamber intraocular lens   Anterior Vitreous Normal Normal         Fundus Exam       Right Left   Posterior Vitreous Clear avitric    Disc Normal    C/D Ratio 0.35    Macula Much less topographic distortion.  Typical post ILM oval changes    Vessels Normal    Periphery Normal             IMAGING AND PROCEDURES  Imaging and Procedures for 11/03/21           ASSESSMENT/PLAN:  Epiretinal membrane, right eye Postop day #1 looks great.  Much improved topography.  Clear vitreous clear media     ICD-10-CM   1. Epiretinal membrane, right eye  H35.371       1.  OD looks great today, patient to resume medications restrictions reviewed with the patient.  2.  3.  Ophthalmic Meds Ordered this visit:  No orders of the defined types were placed in this encounter.      Return in about 1 week (around 11/10/2021) for dilate, POST OP, OCT, OD.  Patient Instructions  Ofloxacin  4 times daily to the operative eye  Prednisolone acetate 1 drop to the operative eye 4 times daily  Patient instructed not to refill the medications and use them for maximum of  3 weeks.  Patient instructed do not rub the eye.  Patient has the option to use the patch at  night.   No lifting and bending for 1 week. No water IN the eye for 10 days. Do not rub the eye. Wear shield at night for 1-3 days.  Continue your topical medications for a total of 3 weeks.  Do not refill your postoperative medications unless instructed.  Refrain from exercise or intentional activity which increases our heart rate above resting levels.  Normal walking to complete normal activities of your day are appropriate.  Driving:  Legally, you only need one good eye, of 20/40 or better to drive.  However, the practice does not recommend driving during first weeks after surgery, IF you are uncomfortable with your visual functioning or capabilities.   If you have known sleep apnea, wear your CPAP as you normally should.   Explained the diagnoses, plan, and follow up with the patient and they expressed understanding.  Patient expressed understanding of the importance of proper follow up care.   Alford Highland Dhruvi Crenshaw M.D. Diseases & Surgery of the Retina and Vitreous [facility] 11/03/21     Abbreviations: M myopia (nearsighted); A astigmatism; H hyperopia (farsighted); P presbyopia; Mrx spectacle prescription;  CTL contact lenses; OD right eye; OS left eye; OU both eyes  XT exotropia; ET esotropia; PEK punctate epithelial keratitis; PEE punctate epithelial erosions; DES dry eye syndrome; MGD meibomian gland dysfunction; ATs artificial tears; PFAT's preservative free artificial tears; NSC nuclear sclerotic cataract; PSC posterior subcapsular cataract; ERM epi-retinal membrane; PVD posterior vitreous detachment; RD retinal detachment; DM diabetes mellitus; DR diabetic retinopathy; NPDR non-proliferative diabetic retinopathy; PDR proliferative diabetic retinopathy; CSME clinically significant macular edema; DME diabetic macular edema; dbh dot blot hemorrhages; CWS cotton wool spot; POAG primary open angle glaucoma; C/D cup-to-disc ratio; HVF humphrey visual field; GVF goldmann visual  field; OCT optical coherence tomography; IOP intraocular pressure; BRVO Branch retinal vein occlusion; CRVO central retinal vein occlusion; CRAO central retinal artery occlusion; BRAO branch retinal artery occlusion; RT retinal tear; SB scleral buckle; PPV pars plana vitrectomy; VH Vitreous hemorrhage; PRP panretinal laser photocoagulation; IVK intravitreal kenalog; VMT vitreomacular traction; MH Macular hole;  NVD neovascularization of the disc; NVE neovascularization elsewhere; AREDS age related eye disease study; ARMD age related macular degeneration; POAG primary open angle glaucoma; EBMD epithelial/anterior basement membrane dystrophy; ACIOL anterior chamber intraocular lens; IOL intraocular lens; PCIOL posterior chamber intraocular lens; Phaco/IOL phacoemulsification with intraocular lens placement; PRK photorefractive keratectomy; LASIK laser assisted in situ keratomileusis; HTN hypertension; DM diabetes mellitus; COPD chronic obstructive pulmonary disease

## 2021-11-03 NOTE — Patient Instructions (Addendum)

## 2021-11-09 ENCOUNTER — Encounter (INDEPENDENT_AMBULATORY_CARE_PROVIDER_SITE_OTHER): Payer: Medicare Other | Admitting: Ophthalmology

## 2021-11-09 ENCOUNTER — Encounter (INDEPENDENT_AMBULATORY_CARE_PROVIDER_SITE_OTHER): Payer: Self-pay | Admitting: Ophthalmology

## 2021-11-09 ENCOUNTER — Ambulatory Visit (INDEPENDENT_AMBULATORY_CARE_PROVIDER_SITE_OTHER): Payer: Medicare Other | Admitting: Ophthalmology

## 2021-11-09 DIAGNOSIS — H35371 Puckering of macula, right eye: Secondary | ICD-10-CM

## 2021-11-09 NOTE — Patient Instructions (Signed)
Ofloxacin  4 times daily to the operative eye  Prednisolone acetate 1 drop to the operative eye 4 times daily  Patient instructed not to refill the medications and use them for maximum of 3 weeks.  Patient instructed do not rub the eye.  Patient has the option to use the patch at night.   Patient may resume full activity in 3 days  Patient instructed to use 1 tablet of super B complex vitamin supplement to assist in healing of the retina

## 2021-11-09 NOTE — Assessment & Plan Note (Signed)
1 week postop vitrectomy membrane peel for ERM looks great.  And healing macula with much less thickening already.  Patient to continue medications only for 2 more weeks then discontinue.

## 2021-11-09 NOTE — Progress Notes (Signed)
11/09/2021     CHIEF COMPLAINT Patient presents for  Chief Complaint  Patient presents with   Post-op Follow-up      HISTORY OF PRESENT ILLNESS: [name] is a 87 y.o. male who presents to the clinic today for:   HPI     Post-op Follow-up           Laterality: right eye   Discomfort: floaters.  Negative for pain, itching, foreign body sensation, tearing and discharge   Vision: is improved         Comments   1 week for DILATE, POST OP, OCT, OD.  Post vitrectomy membrane peel for ERM OD Pt stated floaters in the right eye but reports vision has improved.       Last edited by Edmon Crape, MD on 11/09/2021  8:35 AM.      Referring physician: Practice, [facility] [address] [place],  [place] [address]  HISTORICAL INFORMATION:   Selected notes from the MEDICAL RECORD NUMBER    Lab Results  Component Value Date   HGBA1C 7.8 (H) 05/05/2013     CURRENT MEDICATIONS: Current Outpatient Medications (Ophthalmic Drugs)  Medication Sig   ofloxacin (OCUFLOX) 0.3 % ophthalmic solution Place 1 drop into the right eye 4 (four) times daily for 21 days.   prednisoLONE acetate (PRED FORTE) 1 % ophthalmic suspension Place 1 drop into the right eye 4 (four) times daily for 21 days.   No current facility-administered medications for this visit. (Ophthalmic Drugs)   Current Outpatient Medications (Other)  Medication Sig   aspirin EC 81 MG tablet Take 81 mg by mouth daily.   carvedilol (COREG) 25 MG tablet Take 25 mg by mouth 2 (two) times daily with a meal.   ferrous sulfate 325 (65 FE) MG tablet Take 325 mg by mouth daily with breakfast.   furosemide (LASIX) 40 MG tablet Take 1 tablet by mouth daily.   gabapentin (NEURONTIN) 300 MG capsule Take 3 tablets twice a day   glipiZIDE (GLUCOTROL) 5 MG tablet Take 5 mg by mouth 2 (two) times daily.   levETIRAcetam (KEPPRA) 500 MG tablet Take 500 mg by mouth 2 (two) times daily.   lisinopril (PRINIVIL,ZESTRIL) 5 MG  tablet Take 1 tablet by mouth daily.   metFORMIN (GLUCOPHAGE-XR) 500 MG 24 hr tablet Take 1,000 mg by mouth 2 (two) times daily.   omeprazole (PRILOSEC) 40 MG capsule Take 40 mg by mouth daily.   simvastatin (ZOCOR) 80 MG tablet Take 40 mg by mouth at bedtime.    No current facility-administered medications for this visit. (Other)      REVIEW OF SYSTEMS: ROS   Negative for: Constitutional, Gastrointestinal, Neurological, Skin, Genitourinary, Musculoskeletal, HENT, Endocrine, Cardiovascular, Eyes, Respiratory, Psychiatric, Allergic/Imm, Heme/Lymph Last edited by Angeline Slim on 11/09/2021  8:06 AM.       ALLERGIES Allergies  Allergen Reactions   Omnipaque [Iohexol]    Paxil [Paroxetine Hcl]     Unknown reaction, on file with [facility]   Topamax [Topiramate]     Unknown reaction, on file with [facility]   Codeine Nausea And Vomiting    PAST MEDICAL HISTORY Past Medical History:  Diagnosis Date   Anxiety    CAD (coronary artery disease)    Carotid artery occlusion    CKD (chronic kidney disease), stage III (HCC)    Diabetes mellitus    Hyperlipidemia    Hypertension    Leg pain    with walking   Past Surgical  History:  Procedure Laterality Date   BACK SURGERY  07/10/11   CAROTID ENDARTERECTOMY  01/10/11   Left Carotid   CORONARY STENT PLACEMENT  08/09/2005   Right   SPINE SURGERY  07/10/11    FAMILY HISTORY Family History  Problem Relation Age of Onset   Hypertension Mother    Deep vein thrombosis Mother    Diabetes Mother    Heart disease Mother    COPD Father    Heart disease Sister    Heart attack Sister        Bypass   Heart disease Brother        Heart Disease before age 70   Heart disease Brother    Hypertension Son     SOCIAL HISTORY Social History   Tobacco Use   Smoking status: Former    Types: Cigarettes    Quit date: 04/17/1981    Years since quitting: 40.5   Smokeless tobacco: Never  Substance Use Topics   Alcohol use: No    Alcohol/week: 0.0  standard drinks of alcohol   Drug use: No         OPHTHALMIC EXAM:  Base Eye Exam     Visual Acuity (ETDRS)       Right Left   Dist [place] 20/100 -1 20/30   Dist ph [place]  NI         Tonometry (Tonopen, 8:11 AM)       Right Left   Pressure 26 19         Pupils       Pupils APD   Right PERRL None   Left PERRL None         Visual Fields       Left Right    Full Full         Extraocular Movement       Right Left    Full Full         Neuro/Psych     Oriented x3: Yes         Dilation     Right eye: 2.5% Phenylephrine, 1.0% Mydriacyl @ 8:11 AM           Slit Lamp and Fundus Exam     External Exam       Right Left   External Normal Normal         Slit Lamp Exam       Right Left   Lids/Lashes 1+ Dermatochalasis - upper lid 1+ Dermatochalasis - upper lid   Conjunctiva/Sclera Small Sub conjunctival hemorrhage nonbullous inferior White and quiet   Cornea Clear Clear   Anterior Chamber Deep and quiet Deep and quiet   Iris Round and reactive Round and reactive   Lens Posterior chamber intraocular lens, Centered posterior chamber intraocular lens Posterior chamber intraocular lens, Centered posterior chamber intraocular lens   Anterior Vitreous Normal Normal         Fundus Exam       Right Left   Posterior Vitreous Clear avitric    Disc Normal    C/D Ratio 0.35    Macula Much less topographic distortion.    Vessels Normal    Periphery Normal             IMAGING AND PROCEDURES  Imaging and Procedures for 11/09/21  OCT, Retina - OU - Both Eyes       Right Eye Quality was good. Scan locations included subfoveal. Central Foveal Thickness: 486. Progression has improved.  Findings include abnormal foveal contour.   Left Eye Quality was good. Scan locations included subfoveal. Central Foveal Thickness: 282. Progression has been stable. Findings include normal foveal contour.   Notes Proving macular thickening 1 week post  vitrectomy membrane peel for severe ERM.  Good early healing response             ASSESSMENT/PLAN:  Epiretinal membrane, right eye 1 week postop vitrectomy membrane peel for ERM looks great.  And healing macula with much less thickening already.  Patient to continue medications only for 2 more weeks then discontinue.       ICD-10-CM   1. Epiretinal membrane, right eye  H35.371 OCT, Retina - OU - Both Eyes      1.  2.  3.  Ophthalmic Meds Ordered this visit:  No orders of the defined types were placed in this encounter.      Return in about 3 months (around 02/09/2022) for DILATE OU, OCT.  Patient Instructions  Ofloxacin  4 times daily to the operative eye  Prednisolone acetate 1 drop to the operative eye 4 times daily  Patient instructed not to refill the medications and use them for maximum of 3 weeks.  Patient instructed do not rub the eye.  Patient has the option to use the patch at night.   Patient may resume full activity in 3 days  Patient instructed to use 1 tablet of super B complex vitamin supplement to assist in healing of the retina  Explained the diagnoses, plan, and follow up with the patient and they expressed understanding.  Patient expressed understanding of the importance of proper follow up care.   [facility] Highland Johanthan Kneeland M.D. Diseases & Surgery of the Retina and Vitreous [facility] 11/09/21     Abbreviations: M myopia (nearsighted); A astigmatism; H hyperopia (farsighted); P presbyopia; Mrx spectacle prescription;  CTL contact lenses; OD right eye; OS left eye; OU both eyes  XT exotropia; ET esotropia; PEK punctate epithelial keratitis; PEE punctate epithelial erosions; DES dry eye syndrome; MGD meibomian gland dysfunction; ATs artificial tears; PFAT's preservative free artificial tears; NSC nuclear sclerotic cataract; PSC posterior subcapsular cataract; ERM epi-retinal membrane; PVD posterior vitreous detachment; RD retinal  detachment; DM diabetes mellitus; DR diabetic retinopathy; NPDR non-proliferative diabetic retinopathy; PDR proliferative diabetic retinopathy; CSME clinically significant macular edema; DME diabetic macular edema; dbh dot blot hemorrhages; CWS cotton wool spot; POAG primary open angle glaucoma; C/D cup-to-disc ratio; HVF humphrey visual field; GVF goldmann visual field; OCT optical coherence tomography; IOP intraocular pressure; BRVO Branch retinal vein occlusion; CRVO central retinal vein occlusion; CRAO central retinal artery occlusion; BRAO branch retinal artery occlusion; RT retinal tear; SB scleral buckle; PPV pars plana vitrectomy; VH Vitreous hemorrhage; PRP panretinal laser photocoagulation; IVK intravitreal kenalog; VMT vitreomacular traction; MH Macular hole;  NVD neovascularization of the disc; NVE neovascularization elsewhere; AREDS age related eye disease study; ARMD age related macular degeneration; POAG primary open angle glaucoma; EBMD epithelial/anterior basement membrane dystrophy; ACIOL anterior chamber intraocular lens; IOL intraocular lens; PCIOL posterior chamber intraocular lens; Phaco/IOL phacoemulsification with intraocular lens placement; PRK photorefractive keratectomy; LASIK laser assisted in situ keratomileusis; HTN hypertension; DM diabetes mellitus; COPD chronic obstructive pulmonary disease

## 2021-12-15 DIAGNOSIS — K2100 Gastro-esophageal reflux disease with esophagitis, without bleeding: Secondary | ICD-10-CM | POA: Diagnosis not present

## 2021-12-15 DIAGNOSIS — E782 Mixed hyperlipidemia: Secondary | ICD-10-CM | POA: Diagnosis not present

## 2021-12-15 DIAGNOSIS — E1142 Type 2 diabetes mellitus with diabetic polyneuropathy: Secondary | ICD-10-CM | POA: Diagnosis not present

## 2021-12-23 DIAGNOSIS — N183 Chronic kidney disease, stage 3 unspecified: Secondary | ICD-10-CM | POA: Diagnosis not present

## 2021-12-23 DIAGNOSIS — E7849 Other hyperlipidemia: Secondary | ICD-10-CM | POA: Diagnosis not present

## 2021-12-23 DIAGNOSIS — K2100 Gastro-esophageal reflux disease with esophagitis, without bleeding: Secondary | ICD-10-CM | POA: Diagnosis not present

## 2021-12-23 DIAGNOSIS — E1142 Type 2 diabetes mellitus with diabetic polyneuropathy: Secondary | ICD-10-CM | POA: Diagnosis not present

## 2021-12-23 DIAGNOSIS — I10 Essential (primary) hypertension: Secondary | ICD-10-CM | POA: Diagnosis not present

## 2022-01-02 DIAGNOSIS — D692 Other nonthrombocytopenic purpura: Secondary | ICD-10-CM | POA: Diagnosis not present

## 2022-01-02 DIAGNOSIS — I5032 Chronic diastolic (congestive) heart failure: Secondary | ICD-10-CM | POA: Diagnosis not present

## 2022-01-02 DIAGNOSIS — E7849 Other hyperlipidemia: Secondary | ICD-10-CM | POA: Diagnosis not present

## 2022-01-02 DIAGNOSIS — I25118 Atherosclerotic heart disease of native coronary artery with other forms of angina pectoris: Secondary | ICD-10-CM | POA: Diagnosis not present

## 2022-01-02 DIAGNOSIS — E1142 Type 2 diabetes mellitus with diabetic polyneuropathy: Secondary | ICD-10-CM | POA: Diagnosis not present

## 2022-01-02 DIAGNOSIS — Z0001 Encounter for general adult medical examination with abnormal findings: Secondary | ICD-10-CM | POA: Diagnosis not present

## 2022-01-02 DIAGNOSIS — Z23 Encounter for immunization: Secondary | ICD-10-CM | POA: Diagnosis not present

## 2022-01-02 DIAGNOSIS — D509 Iron deficiency anemia, unspecified: Secondary | ICD-10-CM | POA: Diagnosis not present

## 2022-01-02 DIAGNOSIS — K429 Umbilical hernia without obstruction or gangrene: Secondary | ICD-10-CM | POA: Diagnosis not present

## 2022-01-02 DIAGNOSIS — E1122 Type 2 diabetes mellitus with diabetic chronic kidney disease: Secondary | ICD-10-CM | POA: Diagnosis not present

## 2022-01-02 DIAGNOSIS — I10 Essential (primary) hypertension: Secondary | ICD-10-CM | POA: Diagnosis not present

## 2022-02-13 ENCOUNTER — Encounter (INDEPENDENT_AMBULATORY_CARE_PROVIDER_SITE_OTHER): Payer: Medicare Other | Admitting: Ophthalmology

## 2022-03-27 DIAGNOSIS — E119 Type 2 diabetes mellitus without complications: Secondary | ICD-10-CM | POA: Diagnosis not present

## 2022-03-27 DIAGNOSIS — H35371 Puckering of macula, right eye: Secondary | ICD-10-CM | POA: Diagnosis not present

## 2022-04-16 DIAGNOSIS — E782 Mixed hyperlipidemia: Secondary | ICD-10-CM | POA: Diagnosis not present

## 2022-04-16 DIAGNOSIS — K2100 Gastro-esophageal reflux disease with esophagitis, without bleeding: Secondary | ICD-10-CM | POA: Diagnosis not present

## 2022-04-16 DIAGNOSIS — E1142 Type 2 diabetes mellitus with diabetic polyneuropathy: Secondary | ICD-10-CM | POA: Diagnosis not present

## 2022-05-02 DIAGNOSIS — D649 Anemia, unspecified: Secondary | ICD-10-CM | POA: Diagnosis not present

## 2022-05-02 DIAGNOSIS — E7849 Other hyperlipidemia: Secondary | ICD-10-CM | POA: Diagnosis not present

## 2022-05-02 DIAGNOSIS — I10 Essential (primary) hypertension: Secondary | ICD-10-CM | POA: Diagnosis not present

## 2022-05-02 DIAGNOSIS — E782 Mixed hyperlipidemia: Secondary | ICD-10-CM | POA: Diagnosis not present

## 2022-05-02 DIAGNOSIS — D511 Vitamin B12 deficiency anemia due to selective vitamin B12 malabsorption with proteinuria: Secondary | ICD-10-CM | POA: Diagnosis not present

## 2022-05-02 DIAGNOSIS — N1831 Chronic kidney disease, stage 3a: Secondary | ICD-10-CM | POA: Diagnosis not present

## 2022-05-02 DIAGNOSIS — K2100 Gastro-esophageal reflux disease with esophagitis, without bleeding: Secondary | ICD-10-CM | POA: Diagnosis not present

## 2022-05-02 DIAGNOSIS — E1100 Type 2 diabetes mellitus with hyperosmolarity without nonketotic hyperglycemic-hyperosmolar coma (NKHHC): Secondary | ICD-10-CM | POA: Diagnosis not present

## 2022-05-05 DIAGNOSIS — D509 Iron deficiency anemia, unspecified: Secondary | ICD-10-CM | POA: Diagnosis not present

## 2022-05-05 DIAGNOSIS — E1142 Type 2 diabetes mellitus with diabetic polyneuropathy: Secondary | ICD-10-CM | POA: Diagnosis not present

## 2022-05-05 DIAGNOSIS — I10 Essential (primary) hypertension: Secondary | ICD-10-CM | POA: Diagnosis not present

## 2022-05-05 DIAGNOSIS — K429 Umbilical hernia without obstruction or gangrene: Secondary | ICD-10-CM | POA: Diagnosis not present

## 2022-05-05 DIAGNOSIS — R4582 Worries: Secondary | ICD-10-CM | POA: Diagnosis not present

## 2022-05-05 DIAGNOSIS — I25118 Atherosclerotic heart disease of native coronary artery with other forms of angina pectoris: Secondary | ICD-10-CM | POA: Diagnosis not present

## 2022-05-05 DIAGNOSIS — Z23 Encounter for immunization: Secondary | ICD-10-CM | POA: Diagnosis not present

## 2022-05-05 DIAGNOSIS — E7849 Other hyperlipidemia: Secondary | ICD-10-CM | POA: Diagnosis not present

## 2022-05-05 DIAGNOSIS — E1342 Other specified diabetes mellitus with diabetic polyneuropathy: Secondary | ICD-10-CM | POA: Diagnosis not present

## 2022-05-05 DIAGNOSIS — E8881 Metabolic syndrome: Secondary | ICD-10-CM | POA: Diagnosis not present

## 2022-05-05 DIAGNOSIS — E1122 Type 2 diabetes mellitus with diabetic chronic kidney disease: Secondary | ICD-10-CM | POA: Diagnosis not present

## 2022-05-25 DIAGNOSIS — I10 Essential (primary) hypertension: Secondary | ICD-10-CM | POA: Diagnosis not present

## 2022-05-25 DIAGNOSIS — I25118 Atherosclerotic heart disease of native coronary artery with other forms of angina pectoris: Secondary | ICD-10-CM | POA: Diagnosis not present

## 2022-05-25 DIAGNOSIS — I5032 Chronic diastolic (congestive) heart failure: Secondary | ICD-10-CM | POA: Diagnosis not present

## 2022-05-25 DIAGNOSIS — E1142 Type 2 diabetes mellitus with diabetic polyneuropathy: Secondary | ICD-10-CM | POA: Diagnosis not present

## 2022-05-25 DIAGNOSIS — D692 Other nonthrombocytopenic purpura: Secondary | ICD-10-CM | POA: Diagnosis not present

## 2022-05-25 DIAGNOSIS — E1122 Type 2 diabetes mellitus with diabetic chronic kidney disease: Secondary | ICD-10-CM | POA: Diagnosis not present

## 2022-05-25 DIAGNOSIS — K2100 Gastro-esophageal reflux disease with esophagitis, without bleeding: Secondary | ICD-10-CM | POA: Diagnosis not present

## 2022-05-25 DIAGNOSIS — L03115 Cellulitis of right lower limb: Secondary | ICD-10-CM | POA: Diagnosis not present

## 2022-05-25 DIAGNOSIS — K429 Umbilical hernia without obstruction or gangrene: Secondary | ICD-10-CM | POA: Diagnosis not present

## 2022-05-25 DIAGNOSIS — E7849 Other hyperlipidemia: Secondary | ICD-10-CM | POA: Diagnosis not present

## 2022-05-25 DIAGNOSIS — D509 Iron deficiency anemia, unspecified: Secondary | ICD-10-CM | POA: Diagnosis not present

## 2022-05-29 DIAGNOSIS — R7989 Other specified abnormal findings of blood chemistry: Secondary | ICD-10-CM | POA: Diagnosis not present

## 2022-05-29 DIAGNOSIS — E119 Type 2 diabetes mellitus without complications: Secondary | ICD-10-CM | POA: Diagnosis not present

## 2022-05-29 DIAGNOSIS — Z885 Allergy status to narcotic agent status: Secondary | ICD-10-CM | POA: Diagnosis not present

## 2022-05-29 DIAGNOSIS — Z7983 Long term (current) use of bisphosphonates: Secondary | ICD-10-CM | POA: Diagnosis not present

## 2022-05-29 DIAGNOSIS — Z7984 Long term (current) use of oral hypoglycemic drugs: Secondary | ICD-10-CM | POA: Diagnosis not present

## 2022-05-29 DIAGNOSIS — Z888 Allergy status to other drugs, medicaments and biological substances status: Secondary | ICD-10-CM | POA: Diagnosis not present

## 2022-05-29 DIAGNOSIS — R112 Nausea with vomiting, unspecified: Secondary | ICD-10-CM | POA: Diagnosis not present

## 2022-06-14 ENCOUNTER — Emergency Department (HOSPITAL_COMMUNITY)
Admission: EM | Admit: 2022-06-14 | Discharge: 2022-06-14 | Disposition: A | Discharge status: Home / Self Care | Payer: Medicare Other | Attending: Emergency Medicine | Admitting: Emergency Medicine

## 2022-06-14 ENCOUNTER — Emergency Department (HOSPITAL_COMMUNITY): Payer: Medicare Other

## 2022-06-14 ENCOUNTER — Other Ambulatory Visit: Payer: Self-pay

## 2022-06-14 DIAGNOSIS — N179 Acute kidney failure, unspecified: Secondary | ICD-10-CM

## 2022-06-14 DIAGNOSIS — E119 Type 2 diabetes mellitus without complications: Secondary | ICD-10-CM | POA: Insufficient documentation

## 2022-06-14 DIAGNOSIS — Z79899 Other long term (current) drug therapy: Secondary | ICD-10-CM | POA: Diagnosis not present

## 2022-06-14 DIAGNOSIS — Z7982 Long term (current) use of aspirin: Secondary | ICD-10-CM | POA: Diagnosis not present

## 2022-06-14 DIAGNOSIS — I509 Heart failure, unspecified: Secondary | ICD-10-CM | POA: Insufficient documentation

## 2022-06-14 DIAGNOSIS — Z1152 Encounter for screening for COVID-19: Secondary | ICD-10-CM | POA: Insufficient documentation

## 2022-06-14 DIAGNOSIS — Z7984 Long term (current) use of oral hypoglycemic drugs: Secondary | ICD-10-CM | POA: Insufficient documentation

## 2022-06-14 DIAGNOSIS — I10 Essential (primary) hypertension: Secondary | ICD-10-CM | POA: Diagnosis not present

## 2022-06-14 DIAGNOSIS — M7989 Other specified soft tissue disorders: Secondary | ICD-10-CM | POA: Diagnosis not present

## 2022-06-14 DIAGNOSIS — I110 Hypertensive heart disease with heart failure: Secondary | ICD-10-CM | POA: Diagnosis not present

## 2022-06-14 DIAGNOSIS — L03115 Cellulitis of right lower limb: Secondary | ICD-10-CM

## 2022-06-14 DIAGNOSIS — R627 Adult failure to thrive: Secondary | ICD-10-CM | POA: Diagnosis not present

## 2022-06-14 DIAGNOSIS — R110 Nausea: Secondary | ICD-10-CM

## 2022-06-14 LAB — URINALYSIS, ROUTINE W REFLEX MICROSCOPIC
Bilirubin Urine: NEGATIVE
Glucose, UA: 100 mg/dL — AB
Hgb urine dipstick: NEGATIVE
Ketones, ur: NEGATIVE mg/dL
Leukocytes,Ua: NEGATIVE
Nitrite: NEGATIVE
Protein, ur: NEGATIVE mg/dL
Specific Gravity, Urine: 1.02 (ref 1.005–1.030)
pH: 5.5 (ref 5.0–8.0)

## 2022-06-14 LAB — COMPREHENSIVE METABOLIC PANEL
ALT: 12 U/L (ref 0–44)
AST: 26 U/L (ref 15–41)
Albumin: 3.1 g/dL — ABNORMAL LOW (ref 3.5–5.0)
Alkaline Phosphatase: 50 U/L (ref 38–126)
Anion gap: 14 (ref 5–15)
BUN: 16 mg/dL (ref 8–23)
CO2: 19 mmol/L — ABNORMAL LOW (ref 22–32)
Calcium: 8.1 mg/dL — ABNORMAL LOW (ref 8.9–10.3)
Chloride: 101 mmol/L (ref 98–111)
Creatinine, Ser: 1.81 mg/dL — ABNORMAL HIGH (ref 0.61–1.24)
GFR, Estimated: 36 mL/min — ABNORMAL LOW (ref >60)
Glucose, Bld: 187 mg/dL — ABNORMAL HIGH (ref 70–99)
Potassium: 4.1 mmol/L (ref 3.5–5.1)
Sodium: 134 mmol/L — ABNORMAL LOW (ref 135–145)
Total Bilirubin: 0.9 mg/dL (ref 0.3–1.2)
Total Protein: 7 g/dL (ref 6.5–8.1)

## 2022-06-14 LAB — CBC WITH DIFFERENTIAL/PLATELET
Abs Immature Granulocytes: 0.11 10*3/uL — ABNORMAL HIGH (ref 0.00–0.07)
Basophils Absolute: 0.1 10*3/uL (ref 0.0–0.1)
Basophils Relative: 1 %
Eosinophils Absolute: 0.5 10*3/uL (ref 0.0–0.5)
Eosinophils Relative: 5 %
HCT: 36 % — ABNORMAL LOW (ref 39.0–52.0)
Hemoglobin: 12.1 g/dL — ABNORMAL LOW (ref 13.0–17.0)
Immature Granulocytes: 1 %
Lymphocytes Relative: 15 %
Lymphs Abs: 1.4 10*3/uL (ref 0.7–4.0)
MCH: 30 pg (ref 26.0–34.0)
MCHC: 33.6 g/dL (ref 30.0–36.0)
MCV: 89.1 fL (ref 80.0–100.0)
Monocytes Absolute: 1.1 10*3/uL — ABNORMAL HIGH (ref 0.1–1.0)
Monocytes Relative: 11 %
Neutro Abs: 6.4 10*3/uL (ref 1.7–7.7)
Neutrophils Relative %: 67 %
Platelets: 232 10*3/uL (ref 150–400)
RBC: 4.04 MIL/uL — ABNORMAL LOW (ref 4.22–5.81)
RDW: 13.1 % (ref 11.5–15.5)
WBC: 9.5 10*3/uL (ref 4.0–10.5)
nRBC: 0 % (ref 0.0–0.2)

## 2022-06-14 LAB — LIPASE, BLOOD: Lipase: 35 U/L (ref 11–51)

## 2022-06-14 LAB — RESP PANEL BY RT-PCR (RSV, FLU A&B, COVID)  RVPGX2
Influenza A by PCR: NEGATIVE
Influenza B by PCR: NEGATIVE
Resp Syncytial Virus by PCR: NEGATIVE
SARS Coronavirus 2 by RT PCR: NEGATIVE

## 2022-06-14 LAB — TROPONIN I (HIGH SENSITIVITY): Troponin I (High Sensitivity): 9 ng/L (ref <18)

## 2022-06-14 LAB — TSH: TSH: 2.003 u[IU]/mL (ref 0.350–4.500)

## 2022-06-14 LAB — CBG MONITORING, ED: Glucose-Capillary: 157 mg/dL — ABNORMAL HIGH (ref 70–99)

## 2022-06-14 MED ORDER — CEPHALEXIN 250 MG PO CAPS
500.0000 mg | ORAL_CAPSULE | Freq: Once | ORAL | Status: AC
  Administered 2022-06-14: 500 mg via ORAL
  Filled 2022-06-14: qty 2

## 2022-06-14 MED ORDER — CEPHALEXIN 500 MG PO CAPS: 500.0000 mg | ORAL_CAPSULE | Freq: Four times a day (QID) | ORAL | 0 refills | Status: DC

## 2022-06-14 MED ORDER — LACTATED RINGERS IV BOLUS
500.0000 mL | Freq: Once | INTRAVENOUS | Status: AC
  Administered 2022-06-14: 500 mL via INTRAVENOUS

## 2022-06-14 NOTE — Discharge Instructions (Addendum)
You were seen in the emergency department for your nausea and your decreased appetite.  You did have a mild kidney injury related to dehydration and we gave you some fluids in the emergency department and you were able to drink fluids as well.  Your labs otherwise showed normal electrolytes, no significant anemia, your heart enzyme was normal, liver enzymes are normal, your thyroid function was normal and you tested negative for COVID and flu.  It is unclear what is causing your decreased appetite at this time but you should follow-up with your primary doctor regarding your symptoms to determine the next steps in your workup as well as to have your kidney function rechecked within the next week.  You also still had evidence of cellulitis or skin infection of your leg and I have given you an antibiotic and you should complete this as prescribed.  You should return to the emergency department if you are having streaking redness up your leg despite the antibiotics, fevers despite the antibiotics, repetitive vomiting, severe pain or if you have any other new or concerning symptoms.

## 2022-06-14 NOTE — ED Triage Notes (Signed)
Pt arrives via POV with his Daughter. Daughter states since he received his covid booster last night, patient has had

## 2022-06-14 NOTE — ED Triage Notes (Signed)
Pt/ daughter stated, Given COVID vaccine on jan 19 and the very next day he started going down . He has not been eating, not feel like going out, not feeling like doing anything. Its all we can do to get yogurt in him. We have been to his Dr. And back to hospital with no results and his primary Dr told us just to come here.

## 2022-06-14 NOTE — ED Provider Notes (Signed)
[facility] Provider Note   CSN: [id number] Arrival date & time: 06/14/22  [id number]     History  No chief complaint on file.   [name] is a 88 y.o. male.  Patient is a 88 year old male with a past medical history of hypertension, diabetes, CHF, seizures presenting to the emergency department with failure to thrive.  Patient is here with his daughter and wife states that he received his COVID booster vaccine in January and since then he has been declining.  He states that he has no appetite and will intermittently feel nauseous or dry heaves when he tries to eat.  He states he has no taste but denies any difficulty with swallowing. He states that he has had approximately 20 to 30 pound weight loss in the past 6 weeks.  He denies any associated chest pain or abdominal pain.  He denies any fevers or cough.  He denies any dysuria or hematuria.  He states that he has had some diarrhea but denies any black or bloody stools.  He states that he was recently treated for cellulitis in his right leg which he completed the antibiotics and states the redness improved but did not completely resolve.  The history is provided by the patient, the spouse and a relative.       Home Medications Prior to Admission medications   Medication Sig Start Date End Date Taking? Authorizing Provider  cephALEXin (KEFLEX) 500 MG capsule Take 1 capsule (500 mg total) by mouth 4 (four) times daily. 06/14/22  Yes Leanord Asal K, DO  aspirin EC 81 MG tablet Take 81 mg by mouth daily.    [provider]  carvedilol (COREG) 25 MG tablet Take 25 mg by mouth 2 (two) times daily with a meal.    [provider]  ferrous sulfate 325 (65 FE) MG tablet Take 325 mg by mouth daily with breakfast.    [provider]  furosemide (LASIX) 40 MG tablet Take 1 tablet by mouth daily. 03/09/21   [provider]  gabapentin (NEURONTIN) 300 MG capsule  Take 3 tablets twice a day 06/02/15   Cameron Sprang, MD  glipiZIDE (GLUCOTROL) 5 MG tablet Take 5 mg by mouth 2 (two) times daily. 02/20/15   [provider]  levETIRAcetam (KEPPRA) 500 MG tablet Take 500 mg by mouth 2 (two) times daily.    [provider]  lisinopril (PRINIVIL,ZESTRIL) 5 MG tablet Take 1 tablet by mouth daily. 07/01/15   [provider]  metFORMIN (GLUCOPHAGE-XR) 500 MG 24 hr tablet Take 1,000 mg by mouth 2 (two) times daily. 02/24/15   [provider]  omeprazole (PRILOSEC) 40 MG capsule Take 40 mg by mouth daily.    [provider]  simvastatin (ZOCOR) 80 MG tablet Take 40 mg by mouth at bedtime.     [provider]      Allergies    Omnipaque [iohexol], Paxil [paroxetine hcl], Topamax [topiramate], and Codeine    Review of Systems   Review of Systems  Physical Exam Updated Vital Signs BP 124/62   Pulse 78   Temp 97.7 F (36.5 C)   Resp 20   Ht '5\' 6"'$  (1.676 m)   Wt 80.7 kg   SpO2 97%   BMI 28.73 kg/m  Physical Exam Vitals and nursing note reviewed.  Constitutional:      General: He is not in acute distress.    Appearance: Normal appearance. He is  not ill-appearing.  HENT:     Head: Normocephalic and atraumatic.     Nose: Nose normal.     Mouth/Throat:     Mouth: Mucous membranes are moist.     Pharynx: Oropharynx is clear.  Eyes:     Extraocular Movements: Extraocular movements intact.     Conjunctiva/sclera: Conjunctivae normal.  Cardiovascular:     Rate and Rhythm: Normal rate and regular rhythm.     Pulses: Normal pulses.     Heart sounds: Normal heart sounds.  Pulmonary:     Effort: Pulmonary effort is normal.     Breath sounds: Normal breath sounds.  Abdominal:     General: Abdomen is flat.     Palpations: Abdomen is soft.     Tenderness: There is no abdominal tenderness.  Musculoskeletal:        General: Normal range of motion.     Cervical back: Normal range of motion and neck supple.      Right lower leg: Edema (1+) present.     Left lower leg: Edema (1+) present.  Skin:    General: Skin is warm and dry.     Comments: Erythema and warmth of R ankle and shin, no ankle joint swelling, full ROM, no underlying tenderness to palpation  Neurological:     General: No focal deficit present.     Mental Status: He is alert and oriented to person, place, and time.     Sensory: No sensory deficit.     Motor: No weakness.  Psychiatric:        Mood and Affect: Mood normal.        Behavior: Behavior normal.     ED Results / Procedures / Treatments   Labs (all labs ordered are listed, but only abnormal results are displayed) Labs Reviewed  COMPREHENSIVE METABOLIC PANEL - Abnormal; Notable for the following components:      Result Value   Sodium 134 (*)    CO2 19 (*)    Glucose, Bld 187 (*)    Creatinine, Ser 1.81 (*)    Calcium 8.1 (*)    Albumin 3.1 (*)    GFR, Estimated 36 (*)    All other components within normal limits  CBC WITH DIFFERENTIAL/PLATELET - Abnormal; Notable for the following components:   RBC 4.04 (*)    Hemoglobin 12.1 (*)    HCT 36.0 (*)    Monocytes Absolute 1.1 (*)    Abs Immature Granulocytes 0.11 (*)    All other components within normal limits  URINALYSIS, ROUTINE W REFLEX MICROSCOPIC - Abnormal; Notable for the following components:   Glucose, UA 100 (*)    All other components within normal limits  CBG MONITORING, ED - Abnormal; Notable for the following components:   Glucose-Capillary 157 (*)    All other components within normal limits  RESP PANEL BY RT-PCR (RSV, FLU A&B, COVID)  RVPGX2  LIPASE, BLOOD  TSH  TROPONIN I (HIGH SENSITIVITY)    EKG EKG Interpretation  Date/Time:  Wednesday June 14 2022 09:51:15 EST Ventricular Rate:  89 PR Interval:  140 QRS Duration: 74 QT Interval:  380 QTC Calculation: 462 R Axis:   -4 Text Interpretation: Normal sinus rhythm Low voltage QRS Borderline ECG No significant change since last  tracing Confirmed by Leanord Asal ([id number]) on 06/14/2022 10:32:39 AM  Radiology DG Chest 1 View  Result Date: 06/14/2022 CLINICAL DATA:  Lower extremity swelling EXAM: CHEST  1 VIEW COMPARISON:  CXR 06/14/21 FINDINGS: No  pleural effusion. No pneumothorax. No focal airspace opacity. Normal cardiac and mediastinal contours. No radiographically apparent displaced rib fractures. Visualized upper abdomen is unremarkable. IMPRESSION: No acute cardiopulmonary disease. Electronically Signed   By: Marin Roberts M.D.   On: 06/14/2022 11:57    Procedures Procedures    Medications Ordered in ED Medications  lactated ringers bolus 500 mL (0 mLs Intravenous Stopped 06/14/22 1335)  cephALEXin (KEFLEX) capsule 500 mg (500 mg Oral Given 06/14/22 1408)    ED Course/ Medical Decision Making/ A&P Clinical Course as of 06/14/22 1511  Wed Jun 14, 2022  1306 Labs show mild AKI with Cr 1.8 from baseline 1.4, otherwise no acute abnormalities. I discussed with the patient admission vs outpatient work up and he prefers outpatient work up. He will be given keflex for his cellulitis. UA pending at this time. He has been able to tolerate juice while in the ED. [VK]  1510 UA negative. Patient is stable for discharge with primary care follow up. [VK]    Clinical Course User Index [VK] Kemper Durie, DO                             Medical Decision Making This patient presents to the ED with chief complaint(s) of nausea, FTT with pertinent past medical history of HTN, DM, CHF which further complicates the presenting complaint. The complaint involves an extensive differential diagnosis and also carries with it a high risk of complications and morbidity.    The differential diagnosis includes dehydration, electrolyte abnormality, anemia, ACS, arrhythmia, infection, no obvious signs of sepsis, malignancy, thyroid dysfunction, no focal neurologic deficits making CVA unlikely, evidence of cellulitis on exam without  abscess or evidence of septic joint  Additional history obtained: Additional history obtained from family Records reviewed Primary Care Documents  ED Course and Reassessment: On patient's arrival to the emergency department he is awake alert well-appearing in no acute distress and does not appear significantly dry on exam.  EKG on arrival showed normal sinus rhythm without acute ischemic changes.  The patient will have labs, chest x-ray and urine performed and will be closely reassessed.  Independent labs interpretation:  The following labs were independently interpreted: Cr 1.8 from  baseline 1.4, labs otherwise within normal range  Independent visualization of imaging: - I independently visualized the following imaging with scope of interpretation limited to determining acute life threatening conditions related to emergency care: CXR, which revealed no acute disease  Consultation: - Consulted or discussed management/test interpretation w/ external professional: N/A  Consideration for admission or further workup: Patient has no emergent conditions requiring admission or further work-up at this time and is stable for discharge home with primary care follow-up  Social Determinants of health: N/A    Amount and/or Complexity of Data Reviewed Labs: ordered. Radiology: ordered.  Risk Prescription drug management.          Final Clinical Impression(s) / ED Diagnoses Final diagnoses:  Nausea  Cellulitis of right lower extremity  AKI (acute kidney injury) ([place])    Rx / DC Orders ED Discharge Orders          Ordered    cephALEXin (KEFLEX) 500 MG capsule  4 times daily        06/14/22 1443              Kemper Durie, DO 06/14/22 1511

## 2022-07-06 ENCOUNTER — Encounter: Payer: Self-pay | Admitting: Nurse Practitioner

## 2022-07-21 NOTE — Patient Instructions (Signed)

## 2022-07-24 ENCOUNTER — Encounter: Payer: Self-pay | Admitting: Nurse Practitioner

## 2022-07-24 ENCOUNTER — Ambulatory Visit: Payer: Medicare Other | Admitting: Nurse Practitioner

## 2022-07-24 VITALS — BP 105/64 | Ht 66.0 in | Wt 179.6 lb

## 2022-07-24 DIAGNOSIS — Z794 Long term (current) use of insulin: Secondary | ICD-10-CM | POA: Diagnosis not present

## 2022-07-24 DIAGNOSIS — E1122 Type 2 diabetes mellitus with diabetic chronic kidney disease: Secondary | ICD-10-CM | POA: Diagnosis not present

## 2022-07-24 DIAGNOSIS — N1832 Chronic kidney disease, stage 3b: Secondary | ICD-10-CM | POA: Diagnosis not present

## 2022-07-24 LAB — POCT GLYCOSYLATED HEMOGLOBIN (HGB A1C): Hemoglobin A1C: 8.7 % — AB (ref 4.0–5.6)

## 2022-07-24 MED ORDER — METFORMIN HCL ER 500 MG PO TB24: 500.0000 mg | ORAL_TABLET | Freq: Every day | ORAL | 1 refills | Status: DC

## 2022-07-24 MED ORDER — TRESIBA FLEXTOUCH 200 UNIT/ML ~~LOC~~ SOPN: 68.0000 [IU] | PEN_INJECTOR | Freq: Every evening | SUBCUTANEOUS | 3 refills | Status: AC

## 2022-07-24 NOTE — Progress Notes (Signed)
Endocrinology Consult Note       07/24/2022, 12:24 PM   Subjective:    Patient ID: [name], male    DOB: 12/12/1934.  [name] is being seen in consultation for management of currently uncontrolled symptomatic diabetes requested by  [name] Chimera, MD.   Past Medical History:  Diagnosis Date   Anxiety    CAD (coronary artery disease)    Carotid artery occlusion    CKD (chronic kidney disease), stage III    Diabetes mellitus    Hyperlipidemia    Hypertension    Leg pain    with walking    Past Surgical History:  Procedure Laterality Date   BACK SURGERY  07/10/11   CAROTID ENDARTERECTOMY  01/10/11   Left Carotid   CORONARY STENT PLACEMENT  08/09/2005   Right   SPINE SURGERY  07/10/11    Social History   Socioeconomic History   Marital status: Married    Spouse name: Not on file   Number of children: Not on file   Years of education: Not on file   Highest education level: Not on file  Occupational History   Not on file  Tobacco Use   Smoking status: Former    Types: Cigarettes    Quit date: 04/17/1981    Years since quitting: 41.2   Smokeless tobacco: Never  Substance and Sexual Activity   Alcohol use: No    Alcohol/week: 0.0 standard drinks of alcohol   Drug use: No   Sexual activity: Not on file  Other Topics Concern   Not on file  Social History Narrative   ** Merged History Encounter **       Lives with wife in a one story home.  Has 4 children.  Retired from [employer].  Education: high school.   Social Determinants of Health   Financial Resource Strain: Not on file  Food Insecurity: Not on file  Transportation Needs: Not on file  Physical Activity: Not on file  Stress: Not on file  Social Connections: Not on file    Family History  Problem Relation Age of Onset   Hypertension Mother    Deep vein thrombosis Mother    Diabetes Mother    Heart disease  Mother    COPD Father    Heart disease Sister    Heart attack Sister        Bypass   Heart disease Brother        Heart Disease before age 85   Heart disease Brother    Hypertension Son     Outpatient Encounter Medications as of 07/24/2022  Medication Sig   aspirin EC 81 MG tablet Take 81 mg by mouth daily.   carvedilol (COREG) 25 MG tablet Take 25 mg by mouth 2 (two) times daily with a meal.   ferrous sulfate 325 (65 FE) MG tablet Take 325 mg by mouth daily with breakfast.   furosemide (LASIX) 40 MG tablet Take 1 tablet by mouth daily.   gabapentin (NEURONTIN) 300 MG capsule Take 3 tablets twice a day   levETIRAcetam (KEPPRA) 500 MG tablet Take 500 mg by mouth 2 (two) times daily.   lisinopril (  PRINIVIL,ZESTRIL) 5 MG tablet Take 1 tablet by mouth daily.   omeprazole (PRILOSEC) 40 MG capsule Take 40 mg by mouth daily.   predniSONE (DELTASONE) 5 MG tablet Take 5 mg by mouth daily with breakfast.   simvastatin (ZOCOR) 80 MG tablet Take 40 mg by mouth at bedtime.    [DISCONTINUED] insulin degludec (TRESIBA FLEXTOUCH) 200 UNIT/ML FlexTouch Pen Inject 68 Units into the skin at bedtime.   [DISCONTINUED] metFORMIN (GLUCOPHAGE-XR) 500 MG 24 hr tablet Take 1,000 mg by mouth 2 (two) times daily.   cephALEXin (KEFLEX) 500 MG capsule Take 1 capsule (500 mg total) by mouth 4 (four) times daily. (Patient not taking: Reported on 07/24/2022)   insulin degludec (TRESIBA FLEXTOUCH) 200 UNIT/ML FlexTouch Pen Inject 68 Units into the skin at bedtime.   metFORMIN (GLUCOPHAGE-XR) 500 MG 24 hr tablet Take 1 tablet (500 mg total) by mouth daily with breakfast.   [DISCONTINUED] glipiZIDE (GLUCOTROL) 5 MG tablet Take 5 mg by mouth 2 (two) times daily. (Patient not taking: Reported on 07/24/2022)   No facility-administered encounter medications on file as of 07/24/2022.    ALLERGIES: Allergies  Allergen Reactions   Omnipaque [Iohexol]    Paxil [Paroxetine Hcl]     Unknown reaction, on file with [facility]    Topamax [Topiramate]     Unknown reaction, on file with [facility]   Codeine Nausea And Vomiting    VACCINATION STATUS:  There is no immunization history on file for this patient.  Diabetes He presents for his initial diabetic visit. He has type 2 diabetes mellitus. Onset time: Diagnosed at approx age of 58. His disease course has been fluctuating. Hypoglycemia symptoms include nervousness/anxiousness, sleepiness, sweats and tremors. Associated symptoms include fatigue, foot paresthesias and weakness. Hypoglycemia complications include nocturnal hypoglycemia and required assistance. (Had low of 40 overnight in recent months, none in recent weeks) Symptoms are stable. Diabetic complications include heart disease, nephropathy, peripheral neuropathy, PVD and retinopathy. Risk factors for coronary artery disease include diabetes mellitus, dyslipidemia, family history, obesity, male sex, hypertension and sedentary lifestyle. Current diabetic treatment includes oral agent (monotherapy) and insulin injections. He is compliant with treatment most of the time. His weight is decreasing steadily. He is following a generally unhealthy diet. Meal planning includes avoidance of concentrated sweets. He has not had a previous visit with a dietitian. He rarely participates in exercise. (He presents today for his consultation, accompanied by his wife and daughter, with readings showing fluctuating glycemic profile.  His POCT A1c today is 8.7%.  He is on chronic oral Prednisone for back pain.  He monitors glucose twice daily.  He drinks mostly black coffee and diet soda, very little water.  He does not have any routine eating pattern with the exception of breakfast.  He is unable to exercise optimally due to balance problems.  He is UTD on eye exam, never been seen by podiatry in the past although this is one of his goals this year.) An ACE inhibitor/angiotensin II receptor blocker is being taken. He does not see a  podiatrist.Eye exam is current.     Review of systems  Constitutional: + Minimally fluctuating body weight, current Body mass index is 28.99 kg/m., no fatigue, no subjective hyperthermia, no subjective hypothermia Eyes: no blurry vision, no xerophthalmia ENT: no sore throat, no nodules palpated in throat, no dysphagia/odynophagia, no hoarseness Cardiovascular: no chest pain, no shortness of breath, no palpitations, no leg swelling Respiratory: no cough, no shortness of breath Gastrointestinal: no nausea/vomiting/diarrhea Musculoskeletal: no muscle/joint aches  Skin: no rashes, no hyperemia Neurological: no tremors, no numbness, no tingling, no dizziness Psychiatric: no depression, no anxiety  Objective:     BP 105/64 (BP Location: Left Arm, Patient Position: Sitting, Cuff Size: Large)   Ht 5\' 6"  (1.676 m)   Wt 179 lb 9.6 oz (81.5 kg)   BMI 28.99 kg/m   Wt Readings from Last 3 Encounters:  07/24/22 179 lb 9.6 oz (81.5 kg)  06/14/22 178 lb (80.7 kg)  07/28/21 197 lb 12.8 oz (89.7 kg)     BP Readings from Last 3 Encounters:  07/24/22 105/64  06/14/22 (!) 113/58  07/28/21 (!) 144/80     Physical Exam- Limited  Constitutional:  Body mass index is 28.99 kg/m. , not in acute distress, normal state of mind Eyes:  EOMI, no exophthalmos Neck: Supple Cardiovascular: RRR, no murmurs, rubs, or gallops, no edema Respiratory: Adequate breathing efforts, no crackles, rales, rhonchi, or wheezing Musculoskeletal: no gross deformities, strength intact in all four extremities, no gross restriction of joint movements Skin:  no rashes, extensive bruising noted to bilateral arms, hyperemia to BLE with scabbed area (recent cellulitis) Neurological: no tremor with outstretched hands    CMP ( most recent) CMP     Component Value Date/Time   NA 134 (L) 06/14/2022 1055   K 4.1 06/14/2022 1055   CL 101 06/14/2022 1055   CO2 19 (L) 06/14/2022 1055   GLUCOSE 187 (H) 06/14/2022 1055   BUN  16 06/14/2022 1055   CREATININE 1.81 (H) 06/14/2022 1055   CALCIUM 8.1 (L) 06/14/2022 1055   PROT 7.0 06/14/2022 1055   ALBUMIN 3.1 (L) 06/14/2022 1055   AST 26 06/14/2022 1055   ALT 12 06/14/2022 1055   ALKPHOS 50 06/14/2022 1055   BILITOT 0.9 06/14/2022 1055   GFRNONAA 36 (L) 06/14/2022 1055   GFRAA 54 (L) 04/01/2015 0753     Diabetic Labs (most recent): Lab Results  Component Value Date   HGBA1C 8.7 (A) 07/24/2022   HGBA1C 7.8 (H) 05/05/2013   HGBA1C 6.3 (H) 01/11/2011     Lipid Panel ( most recent) Lipid Panel  No results found for: "CHOL", "TRIG", "HDL", "CHOLHDL", "VLDL", "LDLCALC", "LDLDIRECT", "LABVLDL"    Lab Results  Component Value Date   TSH 2.003 06/14/2022   TSH 1.436 05/05/2013           Assessment & Plan:   1) Type 2 diabetes mellitus with stage 3b chronic kidney disease, with long-term current use of insulin  He presents today for his consultation, accompanied by his wife and daughter, with readings showing fluctuating glycemic profile.  His POCT A1c today is 8.7%.  He is on chronic oral Prednisone for back pain.  He monitors glucose twice daily.  He drinks mostly black coffee and diet soda, very little water.  He does not have any routine eating pattern with the exception of breakfast.  He is unable to exercise optimally due to balance problems.  He is UTD on eye exam, never been seen by podiatry in the past although this is one of his goals this year.  - [name] has currently uncontrolled symptomatic type 2 DM since 89 years of age, with most recent A1c of 8.7 %.   -Recent labs reviewed.  - I had a long discussion with him about the progressive nature of diabetes and the pathology behind its complications. -his diabetes is complicated by retinopathy, neuropathy, CAD with stent, hypoglycemia, CKD stage 3a/3b and he remains at a high risk  for more acute and chronic complications which include CAD, CVA, CKD, retinopathy, and neuropathy. These  are all discussed in detail with him.  The following Lifestyle Medicine recommendations according to American College of Lifestyle Medicine [facility]) were discussed and offered to patient and he agrees to start the journey:  A. Whole Foods, Plant-based plate comprising of fruits and vegetables, plant-based proteins, whole-grain carbohydrates was discussed in detail with the patient.   A list for source of those nutrients were also provided to the patient.  Patient will use only water or unsweetened tea for hydration. B.  The need to stay away from risky substances including alcohol, smoking; obtaining 7 to 9 hours of restorative sleep, at least 150 minutes of moderate intensity exercise weekly, the importance of healthy social connections,  and stress reduction techniques were discussed. C.  A full color page of  Calorie density of various food groups per pound showing examples of each food groups was provided to the patient.  - I have counseled him on diet and weight management by adopting a carbohydrate restricted/protein rich diet. Patient is encouraged to switch to unprocessed or minimally processed complex starch and increased protein intake (animal or plant source), fruits, and vegetables. -  he is advised to stick to a routine mealtimes to eat 3 meals a day and avoid unnecessary snacks (to snack only to correct hypoglycemia).   - he acknowledges that there is a room for improvement in his food and drink choices. - Suggestion is made for him to avoid simple carbohydrates from his diet including Cakes, Sweet Desserts, Ice Cream, Soda (diet and regular), Sweet Tea, Candies, Chips, Cookies, Store Bought Juices, Alcohol in Excess of 1-2 drinks a day, Artificial Sweeteners, Coffee Creamer, and "Sugar-free" Products. This will help patient to have more stable blood glucose profile and potentially avoid unintended weight gain.  - I have approached him with the following individualized plan to manage his  diabetes and patient agrees:   -He is advised to lower dose of Tresiba to 68 units SQ nightly (wife had been using sliding scale previously) and to lower his Metformin to 500 mg ER daily with breakfast (may take 500 mg po twice daily of regular strength until runs out).  -he is encouraged to start monitoring glucose 4 times daily, before meals and before bed, to log their readings on the clinic sheets provided, and bring them to review at follow up appointment in 2 weeks.  - he is warned not to take insulin without proper monitoring per orders. - Adjustment parameters are given to him for hypo and hyperglycemia in writing. - he is encouraged to call clinic for blood glucose levels less than 70 or above 300 mg /dl.  - he is not a candidate for full dose Metformin due to concurrent renal insufficiency.  He has been on Glipizide in the past and had severe hypoglycemia as a result.  - he is not ideal candidate for incretin therapy due to body habitus with BMI near 25.  - Specific targets for  A1c; LDL, HDL, and Triglycerides were discussed with the patient.  2) Blood Pressure /Hypertension:  his blood pressure is controlled to target.   he is advised to continue his current medications including Lisinopril 5 mg p.o. daily with breakfast, Coreg 25 mg po twice daily, and Lasix 40 mg po daily  3) Lipids/Hyperlipidemia:    Review of his recent lipid panel from 05/02/22 showed controlled LDL at 25.4 and elevated triglycerides of 273.  he is advised to continue Simvastatin 80 mg daily at bedtime.  Side effects and precautions discussed with him.  4)  Weight/Diet:  his Body mass index is 28.99 kg/m.  -    he is NOT a candidate for weight loss.   Exercise, and detailed carbohydrates information provided  -  detailed on discharge instructions.  5) Chronic Care/Health Maintenance: -he is on ACEI/ARB and Statin medications and is encouraged to initiate and continue to follow up with Ophthalmology,  Dentist, Podiatrist at least yearly or according to recommendations, and advised to stay away from smoking. I have recommended yearly flu vaccine and pneumonia vaccine at least every 5 years; moderate intensity exercise for up to 150 minutes weekly; and sleep for at least 7 hours a day.  - he is advised to maintain close follow up with [name] Chimeraaniel, Terry G, MD for primary care needs, as well as his other providers for optimal and coordinated care.   - Time spent in this patient care: 60 min, of which > 50% was spent in counseling him about his diabetes and the rest reviewing his blood glucose logs, discussing his hypoglycemia and hyperglycemia episodes, reviewing his current and previous labs/studies (including abstraction from other facilities) and medications doses and developing a long term treatment plan based on the latest standards of care/guidelines; and documenting his care.    Please refer to Patient Instructions for Blood Glucose Monitoring and Insulin/Medications Dosing Guide" in media tab for additional information. Please also refer to "Patient Self Inventory" in the Media tab for reviewed elements of pertinent patient history.  [employer] participated in the discussions, expressed understanding, and voiced agreement with the above plans.  All questions were answered to his satisfaction. he is encouraged to contact clinic should he have any questions or concerns prior to his return visit.     Follow up plan: - Return in about 4 weeks (around 08/21/2022) for Diabetes F/U with A1c in office, No previsit labs, Bring meter and logs.    Ronny BaconWhitney Avyanna Spada, [facility] - Hot SpringsFNP-BC [facility] [address] [place], [place] [address] Phone: [phone] Fax: [phone]  07/24/2022, 12:24 PM

## 2022-07-25 DIAGNOSIS — E08649 Diabetes mellitus due to underlying condition with hypoglycemia without coma: Secondary | ICD-10-CM | POA: Diagnosis not present

## 2022-07-27 NOTE — Progress Notes (Signed)
Patient seen in the ED for decreased appetite and generalized weakness. Viral swab was performed to evaluate for possible viral etiology to explain his symptoms.

## 2022-08-03 DIAGNOSIS — M1611 Unilateral primary osteoarthritis, right hip: Secondary | ICD-10-CM | POA: Diagnosis not present

## 2022-08-03 DIAGNOSIS — M791 Myalgia, unspecified site: Secondary | ICD-10-CM | POA: Diagnosis not present

## 2022-08-03 DIAGNOSIS — R030 Elevated blood-pressure reading, without diagnosis of hypertension: Secondary | ICD-10-CM | POA: Diagnosis not present

## 2022-08-03 DIAGNOSIS — L03115 Cellulitis of right lower limb: Secondary | ICD-10-CM | POA: Diagnosis not present

## 2022-08-23 ENCOUNTER — Ambulatory Visit: Payer: Medicare Other | Admitting: Nurse Practitioner

## 2022-08-24 DIAGNOSIS — E08649 Diabetes mellitus due to underlying condition with hypoglycemia without coma: Secondary | ICD-10-CM | POA: Diagnosis not present

## 2022-08-29 DIAGNOSIS — E7849 Other hyperlipidemia: Secondary | ICD-10-CM | POA: Diagnosis not present

## 2022-08-29 DIAGNOSIS — E1100 Type 2 diabetes mellitus with hyperosmolarity without nonketotic hyperglycemic-hyperosmolar coma (NKHHC): Secondary | ICD-10-CM | POA: Diagnosis not present

## 2022-08-29 DIAGNOSIS — I10 Essential (primary) hypertension: Secondary | ICD-10-CM | POA: Diagnosis not present

## 2022-08-29 DIAGNOSIS — E8881 Metabolic syndrome: Secondary | ICD-10-CM | POA: Diagnosis not present

## 2022-08-29 DIAGNOSIS — D649 Anemia, unspecified: Secondary | ICD-10-CM | POA: Diagnosis not present

## 2022-08-29 DIAGNOSIS — N1831 Chronic kidney disease, stage 3a: Secondary | ICD-10-CM | POA: Diagnosis not present

## 2022-08-29 DIAGNOSIS — K2100 Gastro-esophageal reflux disease with esophagitis, without bleeding: Secondary | ICD-10-CM | POA: Diagnosis not present

## 2022-08-29 DIAGNOSIS — D511 Vitamin B12 deficiency anemia due to selective vitamin B12 malabsorption with proteinuria: Secondary | ICD-10-CM | POA: Diagnosis not present

## 2022-08-30 ENCOUNTER — Ambulatory Visit: Payer: Medicare Other | Admitting: Nurse Practitioner

## 2022-09-01 ENCOUNTER — Encounter: Payer: Self-pay | Admitting: Nurse Practitioner

## 2022-09-01 ENCOUNTER — Ambulatory Visit: Payer: Medicare Other | Admitting: Nurse Practitioner

## 2022-09-01 VITALS — BP 94/58 | HR 66 | Ht 66.0 in | Wt 177.0 lb

## 2022-09-01 DIAGNOSIS — E782 Mixed hyperlipidemia: Secondary | ICD-10-CM | POA: Diagnosis not present

## 2022-09-01 DIAGNOSIS — N1832 Chronic kidney disease, stage 3b: Secondary | ICD-10-CM | POA: Diagnosis not present

## 2022-09-01 DIAGNOSIS — E1122 Type 2 diabetes mellitus with diabetic chronic kidney disease: Secondary | ICD-10-CM

## 2022-09-01 DIAGNOSIS — I10 Essential (primary) hypertension: Secondary | ICD-10-CM

## 2022-09-01 DIAGNOSIS — Z794 Long term (current) use of insulin: Secondary | ICD-10-CM

## 2022-09-01 NOTE — Progress Notes (Signed)
Endocrinology Follow Up Note       09/01/2022, 9:58 AM   Subjective:    Patient ID: [name], male    DOB: April 09, 1935.  [name] is being seen in follow up after being seen in consultation for management of currently uncontrolled symptomatic diabetes requested by  Richardean Chimera, MD.   Past Medical History:  Diagnosis Date   Anxiety    CAD (coronary artery disease)    Carotid artery occlusion    CKD (chronic kidney disease), stage III (HCC)    Diabetes mellitus    Hyperlipidemia    Hypertension    Leg pain    with walking    Past Surgical History:  Procedure Laterality Date   BACK SURGERY  07/10/11   CAROTID ENDARTERECTOMY  01/10/11   Left Carotid   CORONARY STENT PLACEMENT  08/09/2005   Right   SPINE SURGERY  07/10/11    Social History   Socioeconomic History   Marital status: Married    Spouse name: Not on file   Number of children: Not on file   Years of education: Not on file   Highest education level: Not on file  Occupational History   Not on file  Tobacco Use   Smoking status: Former    Types: Cigarettes    Quit date: 04/17/1981    Years since quitting: 41.4   Smokeless tobacco: Never  Substance and Sexual Activity   Alcohol use: No    Alcohol/week: 0.0 standard drinks of alcohol   Drug use: No   Sexual activity: Not on file  Other Topics Concern   Not on file  Social History Narrative   ** Merged History Encounter **       Lives with wife in a one story home.  Has 4 children.  Retired from [employer].  Education: high school.   Social Determinants of Health   Financial Resource Strain: Not on file  Food Insecurity: Not on file  Transportation Needs: Not on file  Physical Activity: Not on file  Stress: Not on file  Social Connections: Not on file    Family History  Problem Relation Age of Onset   Hypertension Mother    Deep vein thrombosis Mother     Diabetes Mother    Heart disease Mother    COPD Father    Heart disease Sister    Heart attack Sister        Bypass   Heart disease Brother        Heart Disease before age 39   Heart disease Brother    Hypertension Son     Outpatient Encounter Medications as of 09/01/2022  Medication Sig   aspirin EC 81 MG tablet Take 81 mg by mouth daily.   carvedilol (COREG) 25 MG tablet Take 25 mg by mouth 2 (two) times daily with a meal.   ferrous sulfate 325 (65 FE) MG tablet Take 325 mg by mouth daily with breakfast.   furosemide (LASIX) 40 MG tablet Take 1 tablet by mouth daily.   gabapentin (NEURONTIN) 300 MG capsule Take 3 tablets twice a day   insulin degludec (TRESIBA FLEXTOUCH) 200 UNIT/ML FlexTouch Pen Inject  68 Units into the skin at bedtime.   levETIRAcetam (KEPPRA) 500 MG tablet Take 500 mg by mouth 2 (two) times daily.   lisinopril (PRINIVIL,ZESTRIL) 5 MG tablet Take 1 tablet by mouth daily.   metFORMIN (GLUCOPHAGE-XR) 500 MG 24 hr tablet Take 1 tablet (500 mg total) by mouth daily with breakfast.   omeprazole (PRILOSEC) 40 MG capsule Take 40 mg by mouth daily.   predniSONE (DELTASONE) 5 MG tablet Take 5 mg by mouth daily with breakfast.   simvastatin (ZOCOR) 80 MG tablet Take 40 mg by mouth at bedtime.    cephALEXin (KEFLEX) 500 MG capsule Take 1 capsule (500 mg total) by mouth 4 (four) times daily. (Patient not taking: Reported on 07/24/2022)   No facility-administered encounter medications on file as of 09/01/2022.    ALLERGIES: Allergies  Allergen Reactions   Omnipaque [Iohexol]    Paxil [Paroxetine Hcl]     Unknown reaction, on file with [facility]   Topamax [Topiramate]     Unknown reaction, on file with [facility]   Codeine Nausea And Vomiting    VACCINATION STATUS:  There is no immunization history on file for this patient.  Diabetes He presents for his follow-up diabetic visit. He has type 2 diabetes mellitus. Onset time: Diagnosed at approx age of 88. His disease  course has been improving. Hypoglycemia symptoms include nervousness/anxiousness, sleepiness, sweats and tremors. Associated symptoms include fatigue, foot paresthesias and weakness. Hypoglycemia complications include nocturnal hypoglycemia. (Had low of 40 overnight in recent months, none in recent weeks) Symptoms are stable. Diabetic complications include heart disease, nephropathy, peripheral neuropathy, PVD and retinopathy. Risk factors for coronary artery disease include diabetes mellitus, dyslipidemia, family history, obesity, male sex, hypertension and sedentary lifestyle. Current diabetic treatment includes oral agent (monotherapy) and insulin injections. He is compliant with treatment most of the time. His weight is decreasing steadily. He is following a generally healthy diet. Meal planning includes avoidance of concentrated sweets. He has not had a previous visit with a dietitian. He rarely participates in exercise. His home blood glucose trend is decreasing steadily. His breakfast blood glucose range is generally <70 mg/dl. His bedtime blood glucose range is generally 140-180 mg/dl. (He presents today, accompanied by his wife, with CGM and readings showing improved glycemic profile overall.  He does have tight fasting readings, sometimes in the 60s. He is working hard on his diet and activity level.  He was not due for another A1c today.) An ACE inhibitor/angiotensin II receptor blocker is being taken. He does not see a podiatrist.Eye exam is current.     Review of systems  Constitutional: + decreasing body weight,  current Body mass index is 28.57 kg/m. , no fatigue, no subjective hyperthermia, no subjective hypothermia Eyes: no blurry vision, no xerophthalmia ENT: no sore throat, no nodules palpated in throat, no dysphagia/odynophagia, no hoarseness Cardiovascular: no chest pain, no shortness of breath, no palpitations, no leg swelling Respiratory: no cough, no shortness of  breath Gastrointestinal: no nausea/vomiting/diarrhea Musculoskeletal: no muscle/joint aches Skin: no rashes, no hyperemia Neurological: no tremors, no numbness, no tingling, no dizziness Psychiatric: no depression, no anxiety  Objective:     BP (!) 94/58 (BP Location: Left Arm, Patient Position: Sitting, Cuff Size: Large)   Pulse 66   Ht 5\' 6"  (1.676 m)   Wt 177 lb (80.3 kg)   BMI 28.57 kg/m   Wt Readings from Last 3 Encounters:  09/01/22 177 lb (80.3 kg)  07/24/22 179 lb 9.6 oz (81.5 kg)  06/14/22  178 lb (80.7 kg)     BP Readings from Last 3 Encounters:  09/01/22 (!) 94/58  07/24/22 105/64  06/14/22 (!) 113/58     Physical Exam- Limited  Constitutional:  Body mass index is 28.57 kg/m. , not in acute distress, normal state of mind Eyes:  EOMI, no exophthalmos Musculoskeletal: no gross deformities, strength intact in all four extremities, no gross restriction of joint movements Skin:  no rashes, extensive bruising noted to bilateral arms, hyperemia to BLE with scabbed area (recent cellulitis) Neurological: no tremor with outstretched hands    CMP ( most recent) CMP     Component Value Date/Time   NA 134 (L) 06/14/2022 1055   K 4.1 06/14/2022 1055   CL 101 06/14/2022 1055   CO2 19 (L) 06/14/2022 1055   GLUCOSE 187 (H) 06/14/2022 1055   BUN 16 06/14/2022 1055   CREATININE 1.81 (H) 06/14/2022 1055   CALCIUM 8.1 (L) 06/14/2022 1055   PROT 7.0 06/14/2022 1055   ALBUMIN 3.1 (L) 06/14/2022 1055   AST 26 06/14/2022 1055   ALT 12 06/14/2022 1055   ALKPHOS 50 06/14/2022 1055   BILITOT 0.9 06/14/2022 1055   GFRNONAA 36 (L) 06/14/2022 1055   GFRAA 54 (L) 04/01/2015 0753     Diabetic Labs (most recent): Lab Results  Component Value Date   HGBA1C 8.7 (A) 07/24/2022   HGBA1C 7.8 (H) 05/05/2013   HGBA1C 6.3 (H) 01/11/2011     Lipid Panel ( most recent) Lipid Panel  No results found for: "CHOL", "TRIG", "HDL", "CHOLHDL", "VLDL", "LDLCALC", "LDLDIRECT",  "LABVLDL"    Lab Results  Component Value Date   TSH 2.003 06/14/2022   TSH 1.436 05/05/2013           Assessment & Plan:   1) Type 2 diabetes mellitus with stage 3b chronic kidney disease, with long-term current use of insulin  He presents today, accompanied by his wife, with CGM and readings showing improved glycemic profile overall.  He does have tight fasting readings, sometimes in the 60s. He is working hard on his diet and activity level.  He was not due for another A1c today.  - Saabir Kendzierski has currently uncontrolled symptomatic type 2 DM since 88 years of age, with most recent A1c of 8.7 %.   -Recent labs reviewed.  - I had a long discussion with him about the progressive nature of diabetes and the pathology behind its complications. -his diabetes is complicated by retinopathy, neuropathy, CAD with stent, hypoglycemia, CKD stage 3a/3b and he remains at a high risk for more acute and chronic complications which include CAD, CVA, CKD, retinopathy, and neuropathy. These are all discussed in detail with him.  The following Lifestyle Medicine recommendations according to American College of Lifestyle Medicine [facility]) were discussed and offered to patient and he agrees to start the journey:  A. Whole Foods, Plant-based plate comprising of fruits and vegetables, plant-based proteins, whole-grain carbohydrates was discussed in detail with the patient.   A list for source of those nutrients were also provided to the patient.  Patient will use only water or unsweetened tea for hydration. B.  The need to stay away from risky substances including alcohol, smoking; obtaining 7 to 9 hours of restorative sleep, at least 150 minutes of moderate intensity exercise weekly, the importance of healthy social connections,  and stress reduction techniques were discussed. C.  A full color page of  Calorie density of various food groups per pound showing examples of each food  groups was provided to  the patient.  - Nutritional counseling repeated at each appointment due to patients tendency to fall back in to old habits.  - The patient admits there is a room for improvement in their diet and drink choices. -  Suggestion is made for the patient to avoid simple carbohydrates from their diet including Cakes, Sweet Desserts / Pastries, Ice Cream, Soda (diet and regular), Sweet Tea, Candies, Chips, Cookies, Sweet Pastries, Store Bought Juices, Alcohol in Excess of 1-2 drinks a day, Artificial Sweeteners, Coffee Creamer, and "Sugar-free" Products. This will help patient to have stable blood glucose profile and potentially avoid unintended weight gain.   - I encouraged the patient to switch to unprocessed or minimally processed complex starch and increased protein intake (animal or plant source), fruits, and vegetables.   - Patient is advised to stick to a routine mealtimes to eat 3 meals a day and avoid unnecessary snacks (to snack only to correct hypoglycemia).  - I have approached him with the following individualized plan to manage his diabetes and patient agrees:   -He is advised to lower dose of Tresiba to 50 units SQ nightly and to continue Metformin 500 mg ER daily with breakfast.  -he is encouraged to continue monitoring glucose 4 times daily (using his CGM), before meals and before bed, and to call the clinic if he has readings less than 70 or above 300 for 3 tests in a row.  - he is warned not to take insulin without proper monitoring per orders. - Adjustment parameters are given to him for hypo and hyperglycemia in writing.  - he is not a candidate for full dose Metformin due to concurrent renal insufficiency.  He has been on Glipizide in the past and had severe hypoglycemia as a result.  - he is not ideal candidate for incretin therapy due to body habitus with BMI near 25.  - Specific targets for  A1c; LDL, HDL, and Triglycerides were discussed with the patient.  2) Blood  Pressure /Hypertension:  his blood pressure is controlled to target.   he is advised to continue his current medications including Lisinopril 5 mg p.o. daily with breakfast, Coreg 25 mg po twice daily, and Lasix 40 mg po daily  3) Lipids/Hyperlipidemia:    Review of his recent lipid panel from 05/02/22 showed controlled LDL at 25.4 and elevated triglycerides of 273.  he is advised to continue Simvastatin 80 mg daily at bedtime.  Side effects and precautions discussed with him.  4)  Weight/Diet:  his Body mass index is 28.57 kg/m.  -    he is NOT a candidate for weight loss.   Exercise, and detailed carbohydrates information provided  -  detailed on discharge instructions.  5) Chronic Care/Health Maintenance: -he is on ACEI/ARB and Statin medications and is encouraged to initiate and continue to follow up with Ophthalmology, Dentist, Podiatrist at least yearly or according to recommendations, and advised to stay away from smoking. I have recommended yearly flu vaccine and pneumonia vaccine at least every 5 years; moderate intensity exercise for up to 150 minutes weekly; and sleep for at least 7 hours a day.  - he is advised to maintain close follow up with Richardean Chimera, MD for primary care needs, as well as his other providers for optimal and coordinated care.     I spent  38  minutes in the care of the patient today including review of labs from CMP, Lipids, Thyroid Function, Hematology (current  and previous including abstractions from other facilities); face-to-face time discussing  his blood glucose readings/logs, discussing hypoglycemia and hyperglycemia episodes and symptoms, medications doses, his options of short and long term treatment based on the latest standards of care / guidelines;  discussion about incorporating lifestyle medicine;  and documenting the encounter. Risk reduction counseling performed per USPSTF guidelines to reduce obesity and cardiovascular risk factors.     Please  refer to Patient Instructions for Blood Glucose Monitoring and Insulin/Medications Dosing Guide"  in media tab for additional information. Please  also refer to " Patient Self Inventory" in the Media  tab for reviewed elements of pertinent patient history.  [employer] participated in the discussions, expressed understanding, and voiced agreement with the above plans.  All questions were answered to his satisfaction. he is encouraged to contact clinic should he have any questions or concerns prior to his return visit.     Follow up plan: - Return in about 3 months (around 12/02/2022) for Diabetes F/U with A1c in office, No previsit labs, Bring meter and logs.   Ronny Bacon, [facility] [address] [place], [place] [address] Phone: [phone] Fax: [phone]  09/01/2022, 9:58 AM

## 2022-09-01 NOTE — Patient Instructions (Signed)

## 2022-09-05 DIAGNOSIS — I10 Essential (primary) hypertension: Secondary | ICD-10-CM | POA: Diagnosis not present

## 2022-09-05 DIAGNOSIS — D509 Iron deficiency anemia, unspecified: Secondary | ICD-10-CM | POA: Diagnosis not present

## 2022-09-05 DIAGNOSIS — E7849 Other hyperlipidemia: Secondary | ICD-10-CM | POA: Diagnosis not present

## 2022-09-05 DIAGNOSIS — I25118 Atherosclerotic heart disease of native coronary artery with other forms of angina pectoris: Secondary | ICD-10-CM | POA: Diagnosis not present

## 2022-09-05 DIAGNOSIS — K2100 Gastro-esophageal reflux disease with esophagitis, without bleeding: Secondary | ICD-10-CM | POA: Diagnosis not present

## 2022-09-05 DIAGNOSIS — M8580 Other specified disorders of bone density and structure, unspecified site: Secondary | ICD-10-CM | POA: Diagnosis not present

## 2022-09-05 DIAGNOSIS — E1122 Type 2 diabetes mellitus with diabetic chronic kidney disease: Secondary | ICD-10-CM | POA: Diagnosis not present

## 2022-09-05 DIAGNOSIS — E1142 Type 2 diabetes mellitus with diabetic polyneuropathy: Secondary | ICD-10-CM | POA: Diagnosis not present

## 2022-09-05 DIAGNOSIS — L03115 Cellulitis of right lower limb: Secondary | ICD-10-CM | POA: Diagnosis not present

## 2022-09-05 DIAGNOSIS — D692 Other nonthrombocytopenic purpura: Secondary | ICD-10-CM | POA: Diagnosis not present

## 2022-09-05 DIAGNOSIS — I5032 Chronic diastolic (congestive) heart failure: Secondary | ICD-10-CM | POA: Diagnosis not present

## 2022-09-05 DIAGNOSIS — E8881 Metabolic syndrome: Secondary | ICD-10-CM | POA: Diagnosis not present

## 2022-09-24 DIAGNOSIS — E08649 Diabetes mellitus due to underlying condition with hypoglycemia without coma: Secondary | ICD-10-CM | POA: Diagnosis not present

## 2022-10-02 DIAGNOSIS — H35371 Puckering of macula, right eye: Secondary | ICD-10-CM | POA: Diagnosis not present

## 2022-10-02 DIAGNOSIS — E119 Type 2 diabetes mellitus without complications: Secondary | ICD-10-CM | POA: Diagnosis not present

## 2022-10-24 DIAGNOSIS — E08649 Diabetes mellitus due to underlying condition with hypoglycemia without coma: Secondary | ICD-10-CM | POA: Diagnosis not present

## 2022-11-21 ENCOUNTER — Other Ambulatory Visit (HOSPITAL_COMMUNITY): Payer: Self-pay | Admitting: Family Medicine

## 2022-11-21 DIAGNOSIS — G459 Transient cerebral ischemic attack, unspecified: Secondary | ICD-10-CM | POA: Diagnosis not present

## 2022-11-22 ENCOUNTER — Ambulatory Visit (HOSPITAL_COMMUNITY)
Admission: RE | Admit: 2022-11-22 | Discharge: 2022-11-22 | Disposition: A | Discharge status: Home / Self Care | Payer: Medicare Other | Source: Ambulatory Visit | Attending: Family Medicine | Admitting: Family Medicine

## 2022-11-22 DIAGNOSIS — G459 Transient cerebral ischemic attack, unspecified: Secondary | ICD-10-CM | POA: Insufficient documentation

## 2022-11-22 DIAGNOSIS — I6782 Cerebral ischemia: Secondary | ICD-10-CM | POA: Diagnosis not present

## 2022-11-22 DIAGNOSIS — R9089 Other abnormal findings on diagnostic imaging of central nervous system: Secondary | ICD-10-CM | POA: Diagnosis not present

## 2022-11-24 DIAGNOSIS — E08649 Diabetes mellitus due to underlying condition with hypoglycemia without coma: Secondary | ICD-10-CM | POA: Diagnosis not present

## 2022-11-25 ENCOUNTER — Emergency Department (HOSPITAL_COMMUNITY)
Admission: EM | Admit: 2022-11-25 | Discharge: 2022-11-25 | Disposition: A | Discharge status: Home / Self Care | Payer: Medicare Other | Attending: Emergency Medicine | Admitting: Emergency Medicine

## 2022-11-25 ENCOUNTER — Emergency Department (HOSPITAL_COMMUNITY): Payer: Medicare Other

## 2022-11-25 ENCOUNTER — Other Ambulatory Visit: Payer: Self-pay

## 2022-11-25 ENCOUNTER — Encounter (HOSPITAL_COMMUNITY): Payer: Self-pay | Admitting: Emergency Medicine

## 2022-11-25 DIAGNOSIS — Z79899 Other long term (current) drug therapy: Secondary | ICD-10-CM | POA: Diagnosis not present

## 2022-11-25 DIAGNOSIS — I2510 Atherosclerotic heart disease of native coronary artery without angina pectoris: Secondary | ICD-10-CM | POA: Diagnosis not present

## 2022-11-25 DIAGNOSIS — Z7984 Long term (current) use of oral hypoglycemic drugs: Secondary | ICD-10-CM | POA: Insufficient documentation

## 2022-11-25 DIAGNOSIS — I10 Essential (primary) hypertension: Secondary | ICD-10-CM | POA: Diagnosis not present

## 2022-11-25 DIAGNOSIS — K603 Anal fistula: Secondary | ICD-10-CM | POA: Diagnosis not present

## 2022-11-25 DIAGNOSIS — L0291 Cutaneous abscess, unspecified: Secondary | ICD-10-CM | POA: Diagnosis not present

## 2022-11-25 DIAGNOSIS — N189 Chronic kidney disease, unspecified: Secondary | ICD-10-CM | POA: Diagnosis not present

## 2022-11-25 DIAGNOSIS — I129 Hypertensive chronic kidney disease with stage 1 through stage 4 chronic kidney disease, or unspecified chronic kidney disease: Secondary | ICD-10-CM | POA: Insufficient documentation

## 2022-11-25 DIAGNOSIS — L0231 Cutaneous abscess of buttock: Secondary | ICD-10-CM | POA: Diagnosis not present

## 2022-11-25 DIAGNOSIS — Z794 Long term (current) use of insulin: Secondary | ICD-10-CM | POA: Insufficient documentation

## 2022-11-25 DIAGNOSIS — E1122 Type 2 diabetes mellitus with diabetic chronic kidney disease: Secondary | ICD-10-CM | POA: Insufficient documentation

## 2022-11-25 DIAGNOSIS — Z7982 Long term (current) use of aspirin: Secondary | ICD-10-CM | POA: Diagnosis not present

## 2022-11-25 LAB — COMPREHENSIVE METABOLIC PANEL
ALT: 65 U/L — ABNORMAL HIGH (ref 0–44)
AST: 32 U/L (ref 15–41)
Albumin: 3.2 g/dL — ABNORMAL LOW (ref 3.5–5.0)
Alkaline Phosphatase: 59 U/L (ref 38–126)
Anion gap: 9 (ref 5–15)
BUN: 34 mg/dL — ABNORMAL HIGH (ref 8–23)
CO2: 26 mmol/L (ref 22–32)
Calcium: 8.5 mg/dL — ABNORMAL LOW (ref 8.9–10.3)
Chloride: 98 mmol/L (ref 98–111)
Creatinine, Ser: 1.64 mg/dL — ABNORMAL HIGH (ref 0.61–1.24)
GFR, Estimated: 40 mL/min — ABNORMAL LOW (ref >60)
Glucose, Bld: 139 mg/dL — ABNORMAL HIGH (ref 70–99)
Potassium: 5.8 mmol/L — ABNORMAL HIGH (ref 3.5–5.1)
Sodium: 133 mmol/L — ABNORMAL LOW (ref 135–145)
Total Bilirubin: 0.5 mg/dL (ref 0.3–1.2)
Total Protein: 6.8 g/dL (ref 6.5–8.1)

## 2022-11-25 LAB — CBC WITH DIFFERENTIAL/PLATELET
Abs Immature Granulocytes: 0.14 10*3/uL — ABNORMAL HIGH (ref 0.00–0.07)
Basophils Absolute: 0 10*3/uL (ref 0.0–0.1)
Basophils Relative: 0 %
Eosinophils Absolute: 0.1 10*3/uL (ref 0.0–0.5)
Eosinophils Relative: 1 %
HCT: 34.6 % — ABNORMAL LOW (ref 39.0–52.0)
Hemoglobin: 11.4 g/dL — ABNORMAL LOW (ref 13.0–17.0)
Immature Granulocytes: 1 %
Lymphocytes Relative: 7 %
Lymphs Abs: 1.1 10*3/uL (ref 0.7–4.0)
MCH: 30.3 pg (ref 26.0–34.0)
MCHC: 32.9 g/dL (ref 30.0–36.0)
MCV: 92 fL (ref 80.0–100.0)
Monocytes Absolute: 1.1 10*3/uL — ABNORMAL HIGH (ref 0.1–1.0)
Monocytes Relative: 7 %
Neutro Abs: 12.6 10*3/uL — ABNORMAL HIGH (ref 1.7–7.7)
Neutrophils Relative %: 84 %
Platelets: 190 10*3/uL (ref 150–400)
RBC: 3.76 MIL/uL — ABNORMAL LOW (ref 4.22–5.81)
RDW: 12.8 % (ref 11.5–15.5)
WBC: 15 10*3/uL — ABNORMAL HIGH (ref 4.0–10.5)
nRBC: 0 % (ref 0.0–0.2)

## 2022-11-25 LAB — POTASSIUM: Potassium: 4.9 mmol/L (ref 3.5–5.1)

## 2022-11-25 MED ORDER — AMOXICILLIN-POT CLAVULANATE 875-125 MG PO TABS
1.0000 | ORAL_TABLET | Freq: Two times a day (BID) | ORAL | 0 refills | Status: DC
Start: 2022-11-25 — End: 2023-01-04

## 2022-11-25 MED ORDER — SODIUM CHLORIDE 0.9 % IV BOLUS: 500.0000 mL | Freq: Once | INTRAVENOUS | Status: DC

## 2022-11-25 MED ORDER — AMOXICILLIN-POT CLAVULANATE 875-125 MG PO TABS
1.0000 | ORAL_TABLET | Freq: Once | ORAL | Status: AC
  Administered 2022-11-25: 1 via ORAL
  Filled 2022-11-25: qty 1

## 2022-11-25 NOTE — ED Provider Notes (Signed)
[place] EMERGENCY DEPARTMENT AT [facility] Provider Note   CSN: [id number] Arrival date & time: 11/25/22  1034     History  Chief Complaint  Patient presents with   Abscess    [name] is a 88 y.o. male with history including type 2 diabetes, hyperlipidemia, chronic kidney disease, hypertension and CAD presenting for evaluation of swelling and drainage from his rectal region.  He first noticed a firm nodule which was relatively nontender about a month ago which slowly increased and now started draining purulence since yesterday.  He was seen by his PCP this morning who sent him here for further evaluation.  He denies fevers or chills, his blood glucose levels have been under good control, no nausea or vomiting or any other complaints.  The history is provided by the patient, the spouse and a relative.       Home Medications Prior to Admission medications   Medication Sig Start Date End Date Taking? Authorizing Provider  amoxicillin-clavulanate (AUGMENTIN) 875-125 MG tablet Take 1 tablet by mouth every 12 (twelve) hours. 11/25/22  Yes IdolRaynelle Fanning, PA-C  aspirin EC 81 MG tablet Take 81 mg by mouth daily.    [provider]  carvedilol (COREG) 25 MG tablet Take 25 mg by mouth 2 (two) times daily with a meal.    [provider]  ferrous sulfate 325 (65 FE) MG tablet Take 325 mg by mouth daily with breakfast.    [provider]  furosemide (LASIX) 40 MG tablet Take 1 tablet by mouth daily. 03/09/21   [provider]  gabapentin (NEURONTIN) 300 MG capsule Take 3 tablets twice a day 06/02/15   Van Clines, MD  insulin degludec (TRESIBA FLEXTOUCH) 200 UNIT/ML FlexTouch Pen Inject 68 Units into the skin at bedtime. 07/24/22   Dani Gobble, NP  levETIRAcetam (KEPPRA) 500 MG tablet Take 500 mg by mouth 2 (two) times daily.    [provider]  lisinopril (PRINIVIL,ZESTRIL) 5 MG tablet Take 1 tablet by mouth daily. 07/01/15    [provider]  metFORMIN (GLUCOPHAGE-XR) 500 MG 24 hr tablet Take 1 tablet (500 mg total) by mouth daily with breakfast. 07/24/22   Dani Gobble, NP  omeprazole (PRILOSEC) 40 MG capsule Take 40 mg by mouth daily.    [provider]  predniSONE (DELTASONE) 5 MG tablet Take 5 mg by mouth daily with breakfast.    [provider]  simvastatin (ZOCOR) 80 MG tablet Take 40 mg by mouth at bedtime.     [provider]      Allergies    Omnipaque [iohexol], Paxil [paroxetine hcl], Topamax [topiramate], and Codeine    Review of Systems   Review of Systems  Constitutional:  Negative for fever.  HENT:  Negative for congestion and sore throat.   Eyes: Negative.   Respiratory:  Negative for chest tightness and shortness of breath.   Cardiovascular:  Negative for chest pain.  Gastrointestinal:  Negative for abdominal pain, anal bleeding, nausea and rectal pain.       As stated in HPI  Genitourinary: Negative.   Musculoskeletal:  Negative for arthralgias, joint swelling and neck pain.  Skin: Negative.  Negative for rash and wound.  Neurological:  Negative for dizziness, weakness, light-headedness, numbness and headaches.  Psychiatric/Behavioral: Negative.      Physical Exam Updated Vital Signs BP (!) 138/59 (BP Location: Right Arm)   Pulse (!) 59   Temp 98 F (36.7 C) (Oral)  Resp 13   Ht 5\' 6"  (1.676 m)   Wt 80.3 kg   SpO2 100%   BMI 28.57 kg/m  Physical Exam Vitals and nursing note reviewed. Exam conducted with a chaperone present.  Constitutional:      Appearance: He is well-developed.  HENT:     Head: Normocephalic and atraumatic.  Eyes:     Conjunctiva/sclera: Conjunctivae normal.  Cardiovascular:     Rate and Rhythm: Normal rate and regular rhythm.     Heart sounds: Normal heart sounds.  Pulmonary:     Effort: Pulmonary effort is normal.     Breath sounds: Normal breath sounds. No wheezing.  Abdominal:     General: Bowel sounds  are normal.     Palpations: Abdomen is soft.     Tenderness: There is no abdominal tenderness.  Genitourinary:    Comments: Patient has an approximate 4 cm area of nontender not erythematous induration within his left buttock just adjacent to his anus.  There is a small amount of tenacious brown mucousy material, with palpation a small amount of light brown material is expressed.  There is no fluctuance.  Patient has no internal swelling or pain on digital rectal exam. Musculoskeletal:        General: Normal range of motion.     Cervical back: Normal range of motion.  Skin:    General: Skin is warm and dry.  Neurological:     Mental Status: He is alert.     ED Results / Procedures / Treatments   Labs (all labs ordered are listed, but only abnormal results are displayed) Labs Reviewed  COMPREHENSIVE METABOLIC PANEL - Abnormal; Notable for the following components:      Result Value   Sodium 133 (*)    Potassium 5.8 (*)    Glucose, Bld 139 (*)    BUN 34 (*)    Creatinine, Ser 1.64 (*)    Calcium 8.5 (*)    Albumin 3.2 (*)    ALT 65 (*)    GFR, Estimated 40 (*)    All other components within normal limits  CBC WITH DIFFERENTIAL/PLATELET - Abnormal; Notable for the following components:   WBC 15.0 (*)    RBC 3.76 (*)    Hemoglobin 11.4 (*)    HCT 34.6 (*)    Neutro Abs 12.6 (*)    Monocytes Absolute 1.1 (*)    Abs Immature Granulocytes 0.14 (*)    All other components within normal limits  POTASSIUM    EKG EKG Interpretation Date/Time:  Saturday November 25 2022 15:41:15 EDT Ventricular Rate:  56 PR Interval:  154 QRS Duration:  90 QT Interval:  432 QTC Calculation: 417 R Axis:   12  Text Interpretation: Sinus rhythm Atrial premature complex Abnormal R-wave progression, early transition Confirmed by Eber Hong ([id number]) on 11/25/2022 3:55:34 PM  Radiology CT PELVIS WO CONTRAST  Result Date: 11/25/2022 CLINICAL DATA:  Buttock abscess with new drainage EXAM: CT  PELVIS WITHOUT CONTRAST TECHNIQUE: Multidetector CT imaging of the pelvis was performed following the standard protocol without intravenous contrast. RADIATION DOSE REDUCTION: This exam was performed according to the departmental dose-optimization program which includes automated exposure control, adjustment of the mA and/or kV according to patient size and/or use of iterative reconstruction technique. COMPARISON:  None Available. FINDINGS: Urinary Tract: No focal bladder wall thickening. Bowel: Partially imaged bowel without bowel wall thickening, distention, or inflammatory changes. Normal appendix. Vascular/Lymphatic: Aortic atherosclerosis. No enlarged abdominal or pelvic lymph nodes. Reproductive:  Mildly enlarged prostate gland. Small bilateral hydroceles. Other: No free fluid, fluid collection, or free air. Musculoskeletal: No acute or abnormal lytic or blastic osseous lesions. Partially imaged lumbar spinal fixation hardware appears intact. Gas-containing subcutaneous soft tissue stranding involving the medial left gluteal cleft extends superiorly into the perianal region at 6 o'clock. No walled-off fluid collection. IMPRESSION: 1. Gas-containing subcutaneous soft tissue stranding involving the medial left gluteal cleft extends superiorly into the perianal region at 6 o'clock, suspicious for perianal fistula. No walled-off fluid collection. 2.  Aortic Atherosclerosis (ICD10-I70.0). Electronically Signed   By: Agustin Cree M.D.   On: 11/25/2022 14:42    Procedures Procedures    Medications Ordered in ED Medications  sodium chloride 0.9 % bolus 500 mL (has no administration in time range)  amoxicillin-clavulanate (AUGMENTIN) 875-125 MG per tablet 1 tablet (has no administration in time range)    ED Course/ Medical Decision Making/ A&P                                 Medical Decision Making Patient presenting with perirectal swelling and drainage without a whole lot of tenderness which has been  slowly worsening over the past month.  Differential diagnosis includes abscess, fistula, mass.  The area of induration does not extend anterior leg, there is no erythema, no suggestion of Fournier's gangrene.  CT imaging and labs were obtained, this is a anal fistula plus or minus infection.  Amount and/or Complexity of Data Reviewed Labs: ordered.    Details: Labs are significant for WBC count of 15.0.  Is a glucose of 139.  His creatinine is 1.64 which is his baseline.  He also has a potassium of 5.8.  This was repeated per second draw and is 4.9, suspect the first potassium was secondary to hemolysis. Radiology: ordered. Discussion of management or test interpretation with external provider(s): Patient discussed with Dr. Lovell Sheehan who recommends a course of Augmentin, sitz bath's and close office follow-up with him.  This plan was discussed with patient and his family and they are agreeable with this plan.  He is in no discomfort.  We did discuss he may need a barrier cream to protect his external skin as this continues to drain.  He is using the depends which is new since this problem began, encouraged him to continue using that but if he becomes irritated he may want to consider Desitin or other barrier cream to protect his skin.  Instructions given for sitz bath's.  Risk Prescription drug management.           Final Clinical Impression(s) / ED Diagnoses Final diagnoses:  Perianal fistula    Rx / DC Orders ED Discharge Orders          Ordered    amoxicillin-clavulanate (AUGMENTIN) 875-125 MG tablet  Every 12 hours        11/25/22 1602              Burgess Amor, PA-C 11/25/22 1612    Terrilee Files, MD 11/25/22 [phone]

## 2022-11-25 NOTE — Discharge Instructions (Signed)
Complete the entire course of the antibiotics prescribed taking your next dose tomorrow morning.  If this skin of your rectal area starts becoming irritated I do recommend a barrier cream such as Desitin to help protect your skin.  You will need follow-up care with Dr. Lovell Sheehan as discussed, call on Monday for an appointment.  We do recommend warm sitz bath's 2-3 times daily to help soothe and potentially heal this site.

## 2022-11-25 NOTE — ED Triage Notes (Signed)
Pt sent over from pcp office for abscess on buttock. Pt states it started to drain yesterday and has a horrible smell. Provider said to come to ER for evaluation

## 2022-12-06 ENCOUNTER — Ambulatory Visit: Payer: Medicare Other | Admitting: Nurse Practitioner

## 2022-12-21 ENCOUNTER — Ambulatory Visit: Payer: Medicare Other | Admitting: General Surgery

## 2022-12-26 ENCOUNTER — Ambulatory Visit: Payer: Medicare Other | Admitting: General Surgery

## 2023-01-04 ENCOUNTER — Ambulatory Visit: Payer: Medicare Other | Admitting: General Surgery

## 2023-01-04 ENCOUNTER — Encounter: Payer: Self-pay | Admitting: General Surgery

## 2023-01-04 VITALS — BP 132/67 | HR 67 | Temp 98.0°F | Resp 14 | Ht 66.0 in | Wt 173.0 lb

## 2023-01-04 DIAGNOSIS — K610 Anal abscess: Secondary | ICD-10-CM

## 2023-01-04 MED ORDER — AMOXICILLIN-POT CLAVULANATE 875-125 MG PO TABS: 1.0000 | ORAL_TABLET | Freq: Two times a day (BID) | ORAL | 0 refills | Status: AC

## 2023-01-04 NOTE — Progress Notes (Signed)
[facility] History and Physical  Reason for Referral: Perianal abscess? Fistula  Referring Physician: ED   Chief Complaint   New Patient (Initial Visit)     [name] is a 88 y.o. male.  HPI: [name] is a 88 yo who has a history of CAD, DM, CKD who came into the ED with perianal pain and swelling in August. This area drained on its on and the ED obtained a CT that showed concern for possible fistula. He says he had a abscess around his anus 15+ years ago but nothing recent.  The area is healed up now and causing him no issues. He says the sitz baths helped him a lot. He is here today with family.   He reports a normal colonoscopy in the past. I am not seeing it in our system.   Past Medical History:  Diagnosis Date   Anxiety    CAD (coronary artery disease)    Carotid artery occlusion    CKD (chronic kidney disease), stage III (HCC)    Diabetes mellitus    Hyperlipidemia    Hypertension    Leg pain    with walking    Past Surgical History:  Procedure Laterality Date   BACK SURGERY  07/10/11   CAROTID ENDARTERECTOMY  01/10/11   Left Carotid   CORONARY STENT PLACEMENT  08/09/2005   Right   SPINE SURGERY  07/10/11    Family History  Problem Relation Age of Onset   Hypertension Mother    Deep vein thrombosis Mother    Diabetes Mother    Heart disease Mother    COPD Father    Heart disease Sister    Heart attack Sister        Bypass   Heart disease Brother        Heart Disease before age 71   Heart disease Brother    Hypertension Son     Social History   Tobacco Use   Smoking status: Former    Current packs/day: 0.00    Types: Cigarettes    Quit date: 04/17/1981    Years since quitting: 41.7   Smokeless tobacco: Never  Substance Use Topics   Alcohol use: No    Alcohol/week: 0.0 standard drinks of alcohol   Drug use: No    Medications: I have reviewed the patient's current medications. Allergies as of 01/04/2023        Reactions   Omnipaque [iohexol]    Paxil [paroxetine Hcl]    Unknown reaction, on file with [facility]   Topamax [topiramate]    Unknown reaction, on file with [facility]   Codeine Nausea And Vomiting        Medication List        Accurate as of January 04, 2023  9:35 AM. If you have any questions, ask your nurse or doctor.          STOP taking these medications    amoxicillin-clavulanate 875-125 MG tablet Commonly known as: AUGMENTIN Stopped by: Lucretia Roers       TAKE these medications    aspirin EC 81 MG tablet Take 81 mg by mouth daily.   carvedilol 25 MG tablet Commonly known as: COREG Take 25 mg by mouth 2 (two) times daily with a meal.   ferrous sulfate 325 (65 FE) MG tablet Take 325 mg by mouth daily with breakfast.   furosemide 40 MG tablet Commonly known as: LASIX Take 1 tablet by mouth daily.  gabapentin 300 MG capsule Commonly known as: NEURONTIN Take 3 tablets twice a day   levETIRAcetam 500 MG tablet Commonly known as: KEPPRA Take 500 mg by mouth 2 (two) times daily.   lisinopril 5 MG tablet Commonly known as: ZESTRIL Take 1 tablet by mouth daily.   metFORMIN 500 MG 24 hr tablet Commonly known as: GLUCOPHAGE-XR Take 1 tablet (500 mg total) by mouth daily with breakfast.   omeprazole 40 MG capsule Commonly known as: PRILOSEC Take 40 mg by mouth daily.   predniSONE 5 MG tablet Commonly known as: DELTASONE Take 5 mg by mouth daily with breakfast.   simvastatin 80 MG tablet Commonly known as: ZOCOR Take 40 mg by mouth at bedtime.   Evaristo Bury FlexTouch 200 UNIT/ML FlexTouch Pen Generic drug: insulin degludec Inject 68 Units into the skin at bedtime.         ROS:  A comprehensive review of systems was negative except for: Gastrointestinal: positive for perianal pain and drainage 1 month ago Musculoskeletal: positive for back pain, neck pain, and joint pain  Blood pressure 132/67, pulse 67, temperature 98 F (36.7 C),  temperature source Oral, resp. rate 14, height 5\' 6"  (1.676 m), weight 173 lb (78.5 kg), SpO2 95%. Physical Exam Vitals reviewed. Exam conducted with a chaperone present.  HENT:     Head: Normocephalic.     Mouth/Throat:     Mouth: Mucous membranes are moist.  Eyes:     Extraocular Movements: Extraocular movements intact.  Cardiovascular:     Rate and Rhythm: Normal rate.  Pulmonary:     Effort: Pulmonary effort is normal.     Breath sounds: Normal breath sounds.  Abdominal:     General: There is no distension.     Palpations: Abdomen is soft.     Tenderness: There is no abdominal tenderness.  Genitourinary:    Rectum: No mass or anal fissure. Normal anal tone.     Comments: Left buttock region small indention could be fistula track, soft, no induration, nontender, no drainage  Musculoskeletal:        General: No swelling.     Cervical back: Normal range of motion.  Skin:    General: Skin is warm.  Neurological:     General: No focal deficit present.     Mental Status: He is alert and oriented to person, place, and time.  Psychiatric:        Mood and Affect: Mood normal.        Behavior: Behavior normal.     Results: personally reviewed and showed patient and family- collection with gas concerning for track to anus- fistula   CLINICAL DATA:  Buttock abscess with new drainage   EXAM: CT PELVIS WITHOUT CONTRAST   TECHNIQUE: Multidetector CT imaging of the pelvis was performed following the standard protocol without intravenous contrast.   RADIATION DOSE REDUCTION: This exam was performed according to the departmental dose-optimization program which includes automated exposure control, adjustment of the mA and/or kV according to patient size and/or use of iterative reconstruction technique.   COMPARISON:  None Available.   FINDINGS: Urinary Tract: No focal bladder wall thickening.   Bowel: Partially imaged bowel without bowel wall thickening, distention, or  inflammatory changes. Normal appendix.   Vascular/Lymphatic: Aortic atherosclerosis. No enlarged abdominal or pelvic lymph nodes.   Reproductive: Mildly enlarged prostate gland. Small bilateral hydroceles.   Other: No free fluid, fluid collection, or free air.   Musculoskeletal: No acute or abnormal lytic or blastic osseous  lesions. Partially imaged lumbar spinal fixation hardware appears intact. Gas-containing subcutaneous soft tissue stranding involving the medial left gluteal cleft extends superiorly into the perianal region at 6 o'clock. No walled-off fluid collection.   IMPRESSION: 1. Gas-containing subcutaneous soft tissue stranding involving the medial left gluteal cleft extends superiorly into the perianal region at 6 o'clock, suspicious for perianal fistula. No walled-off fluid collection. 2.  Aortic Atherosclerosis (ICD10-I70.0).     Electronically Signed   By: Agustin Cree M.D.   On: 11/25/2022 14:42    Assessment & Plan:  [name] is a 88 y.o. male with recent perianal abscess and possible fistula. Discussed that it very well may be but that we would not know for sure unless we did an exam under anesthesia. Discussed that if we did that we may not find anything, we may find a fistula I can cut open or have to place a seton. Discussed that it may never cause any problems or could cause another abscess.   Augmentin Rx sent in  for him to have incase issues arise He is going to think about the options and see how he feels over the next few months   All questions were answered to the satisfaction of the patient and family.  Future Appointments  Date Time Provider Department Center  03/27/2023  9:15 AM Lucretia Roers, MD [facility] None      Lucretia Roers 01/04/2023, 9:35 AM

## 2023-01-05 ENCOUNTER — Other Ambulatory Visit: Payer: Self-pay | Admitting: Nurse Practitioner

## 2023-01-29 DIAGNOSIS — N183 Chronic kidney disease, stage 3 unspecified: Secondary | ICD-10-CM | POA: Diagnosis not present

## 2023-01-29 DIAGNOSIS — E7849 Other hyperlipidemia: Secondary | ICD-10-CM | POA: Diagnosis not present

## 2023-01-29 DIAGNOSIS — Z1321 Encounter for screening for nutritional disorder: Secondary | ICD-10-CM | POA: Diagnosis not present

## 2023-01-29 DIAGNOSIS — E1142 Type 2 diabetes mellitus with diabetic polyneuropathy: Secondary | ICD-10-CM | POA: Diagnosis not present

## 2023-01-29 DIAGNOSIS — Z1329 Encounter for screening for other suspected endocrine disorder: Secondary | ICD-10-CM | POA: Diagnosis not present

## 2023-01-29 DIAGNOSIS — E1122 Type 2 diabetes mellitus with diabetic chronic kidney disease: Secondary | ICD-10-CM | POA: Diagnosis not present

## 2023-01-29 DIAGNOSIS — Z0001 Encounter for general adult medical examination with abnormal findings: Secondary | ICD-10-CM | POA: Diagnosis not present

## 2023-02-05 DIAGNOSIS — D509 Iron deficiency anemia, unspecified: Secondary | ICD-10-CM | POA: Diagnosis not present

## 2023-02-05 DIAGNOSIS — Z0001 Encounter for general adult medical examination with abnormal findings: Secondary | ICD-10-CM | POA: Diagnosis not present

## 2023-02-05 DIAGNOSIS — D692 Other nonthrombocytopenic purpura: Secondary | ICD-10-CM | POA: Diagnosis not present

## 2023-02-05 DIAGNOSIS — I25119 Atherosclerotic heart disease of native coronary artery with unspecified angina pectoris: Secondary | ICD-10-CM | POA: Diagnosis not present

## 2023-02-05 DIAGNOSIS — I10 Essential (primary) hypertension: Secondary | ICD-10-CM | POA: Diagnosis not present

## 2023-02-05 DIAGNOSIS — I5032 Chronic diastolic (congestive) heart failure: Secondary | ICD-10-CM | POA: Diagnosis not present

## 2023-02-05 DIAGNOSIS — E1142 Type 2 diabetes mellitus with diabetic polyneuropathy: Secondary | ICD-10-CM | POA: Diagnosis not present

## 2023-02-05 DIAGNOSIS — E1122 Type 2 diabetes mellitus with diabetic chronic kidney disease: Secondary | ICD-10-CM | POA: Diagnosis not present

## 2023-02-05 DIAGNOSIS — E7849 Other hyperlipidemia: Secondary | ICD-10-CM | POA: Diagnosis not present

## 2023-02-05 DIAGNOSIS — Z23 Encounter for immunization: Secondary | ICD-10-CM | POA: Diagnosis not present

## 2023-02-05 DIAGNOSIS — K429 Umbilical hernia without obstruction or gangrene: Secondary | ICD-10-CM | POA: Diagnosis not present

## 2023-02-06 ENCOUNTER — Telehealth: Payer: Self-pay | Admitting: Nurse Practitioner

## 2023-02-06 ENCOUNTER — Other Ambulatory Visit: Payer: Self-pay

## 2023-02-06 MED ORDER — DEXCOM G7 SENSOR MISC: 1 refills | Status: DC

## 2023-02-06 NOTE — Telephone Encounter (Signed)
Pts wife called requesting prescription of Dexcom G7 sensors, said they come by mail.

## 2023-02-06 NOTE — Telephone Encounter (Signed)
Rx sent 

## 2023-02-15 DIAGNOSIS — E1122 Type 2 diabetes mellitus with diabetic chronic kidney disease: Secondary | ICD-10-CM | POA: Diagnosis not present

## 2023-02-15 DIAGNOSIS — E782 Mixed hyperlipidemia: Secondary | ICD-10-CM | POA: Diagnosis not present

## 2023-02-26 ENCOUNTER — Other Ambulatory Visit: Payer: Self-pay | Admitting: *Deleted

## 2023-02-26 DIAGNOSIS — Z794 Long term (current) use of insulin: Secondary | ICD-10-CM

## 2023-02-26 DIAGNOSIS — E1122 Type 2 diabetes mellitus with diabetic chronic kidney disease: Secondary | ICD-10-CM

## 2023-02-26 MED ORDER — DEXCOM G7 SENSOR MISC: 2 refills | Status: DC

## 2023-03-06 ENCOUNTER — Telehealth: Payer: Self-pay

## 2023-03-06 ENCOUNTER — Other Ambulatory Visit (HOSPITAL_COMMUNITY): Payer: Self-pay

## 2023-03-06 NOTE — Telephone Encounter (Signed)
Pharmacy Patient Advocate Encounter   Received notification from CoverMyMeds that prior authorization for Dexcom G7 Sensor is required/requested.   Insurance verification completed.   The patient is insured through [facility]  Medicare Part D.   Per test claim: PA required; PA submitted to above mentioned insurance via CoverMyMeds Key/confirmation #/EOC [id number] Status is pending

## 2023-03-07 ENCOUNTER — Other Ambulatory Visit: Payer: Self-pay | Admitting: *Deleted

## 2023-03-07 DIAGNOSIS — Z794 Long term (current) use of insulin: Secondary | ICD-10-CM

## 2023-03-07 DIAGNOSIS — N1832 Chronic kidney disease, stage 3b: Secondary | ICD-10-CM

## 2023-03-07 MED ORDER — DEXCOM G7 SENSOR MISC
2 refills | Status: AC
Start: 2023-03-07 — End: ?

## 2023-03-07 NOTE — Telephone Encounter (Signed)
Patient's wife called and ask that  his sensors be sent in to the [facility]. This has been done.

## 2023-03-12 NOTE — Telephone Encounter (Signed)
Pharmacy Patient Advocate Encounter  Received notification from [facility] that Prior Authorization for Dexcom G7 sensor has been APPROVED from 03/09/23 to 04/16/24   PA #/Case ID/Reference #:  [id number]

## 2023-03-13 NOTE — Telephone Encounter (Signed)
Patient was called. Talked with his wife. Made her aware that the Dexcom G7 sensor had been approved.

## 2023-03-27 ENCOUNTER — Ambulatory Visit: Payer: Medicare Other | Admitting: General Surgery

## 2023-03-27 ENCOUNTER — Encounter: Payer: Self-pay | Admitting: General Surgery

## 2023-03-27 VITALS — BP 93/57 | HR 65 | Temp 97.6°F | Resp 12 | Ht 66.0 in | Wt 180.0 lb

## 2023-03-27 DIAGNOSIS — K610 Anal abscess: Secondary | ICD-10-CM

## 2023-03-27 NOTE — Patient Instructions (Signed)
If you have issues with abscesses in the left buttock region again, you very well may have a small fistula in that region. Today I see some skin tags and there could be a small opening that could represent a superficial fistula but no active infection or drainage.   The only way to know for sure if there is a fistula is to do an exam under anesthesia, and after our discussion you want to hold on that at this time and see if you have any other issues.   Call with issues.

## 2023-03-28 NOTE — Progress Notes (Signed)
[facility] Note   HPI:  88 y.o. Male presents to clinic for  follow-up evaluation of his perianal abscess and concern for possible fistula. Patient reports he is doing well and having no drainage or issues. He has had no further symptoms or issues.   Review of Systems:  No drainage or pain All other review of systems: otherwise negative   Vital Signs:  BP (!) 93/57   Pulse 65   Temp 97.6 F (36.4 C) (Oral)   Resp 12   Ht 5\' 6"  (1.676 m)   Wt 180 lb (81.6 kg)   SpO2 93%   BMI 29.05 kg/m    Physical Exam:  Physical Exam Vitals reviewed.  HENT:     Head: Normocephalic.  Cardiovascular:     Rate and Rhythm: Normal rate.  Pulmonary:     Effort: Pulmonary effort is normal.  Abdominal:     Palpations: Abdomen is soft.  Genitourinary:    Comments: Skin tag on the left perianal region, on obvious signs of fistula but could have small track at the base of that tag, no inflammation or swelling, no pain    Assessment:  88 y.o. yo Male with a perianal abscess this past September. He is doing much better. His last episode was 15 years ago.   Plan:  If you have issues with abscesses in the left buttock region again, you very well may have a small fistula in that region. Today I see some skin tags and there could be a small opening that could represent a superficial fistula but no active infection or drainage.   The only way to know for sure if there is a fistula is to do an exam under anesthesia, and after our discussion you want to hold on that at this time and see if you have any other issues.   Call with issues.    Algis Greenhouse, MD [facility] [address] [place], [place] [address] [phone] (office)

## 2023-05-31 DIAGNOSIS — E559 Vitamin D deficiency, unspecified: Secondary | ICD-10-CM | POA: Diagnosis not present

## 2023-05-31 DIAGNOSIS — R739 Hyperglycemia, unspecified: Secondary | ICD-10-CM | POA: Diagnosis not present

## 2023-05-31 DIAGNOSIS — E039 Hypothyroidism, unspecified: Secondary | ICD-10-CM | POA: Diagnosis not present

## 2023-05-31 DIAGNOSIS — I10 Essential (primary) hypertension: Secondary | ICD-10-CM | POA: Diagnosis not present

## 2023-05-31 DIAGNOSIS — E7849 Other hyperlipidemia: Secondary | ICD-10-CM | POA: Diagnosis not present

## 2023-06-07 DIAGNOSIS — E1142 Type 2 diabetes mellitus with diabetic polyneuropathy: Secondary | ICD-10-CM | POA: Diagnosis not present

## 2023-06-07 DIAGNOSIS — I10 Essential (primary) hypertension: Secondary | ICD-10-CM | POA: Diagnosis not present

## 2023-06-07 DIAGNOSIS — E782 Mixed hyperlipidemia: Secondary | ICD-10-CM | POA: Diagnosis not present

## 2023-06-07 DIAGNOSIS — K2100 Gastro-esophageal reflux disease with esophagitis, without bleeding: Secondary | ICD-10-CM | POA: Diagnosis not present

## 2023-06-25 DIAGNOSIS — R5382 Chronic fatigue, unspecified: Secondary | ICD-10-CM | POA: Diagnosis not present

## 2023-06-25 DIAGNOSIS — D511 Vitamin B12 deficiency anemia due to selective vitamin B12 malabsorption with proteinuria: Secondary | ICD-10-CM | POA: Diagnosis not present

## 2023-07-08 ENCOUNTER — Other Ambulatory Visit: Payer: Self-pay | Admitting: Nurse Practitioner

## 2023-08-13 ENCOUNTER — Other Ambulatory Visit: Payer: Self-pay | Admitting: Nurse Practitioner

## 2023-10-05 DIAGNOSIS — K219 Gastro-esophageal reflux disease without esophagitis: Secondary | ICD-10-CM | POA: Diagnosis not present

## 2023-10-05 DIAGNOSIS — E1142 Type 2 diabetes mellitus with diabetic polyneuropathy: Secondary | ICD-10-CM | POA: Diagnosis not present

## 2023-10-05 DIAGNOSIS — R5382 Chronic fatigue, unspecified: Secondary | ICD-10-CM | POA: Diagnosis not present

## 2023-10-05 DIAGNOSIS — E7849 Other hyperlipidemia: Secondary | ICD-10-CM | POA: Diagnosis not present

## 2023-10-05 DIAGNOSIS — Z1321 Encounter for screening for nutritional disorder: Secondary | ICD-10-CM | POA: Diagnosis not present

## 2023-10-05 DIAGNOSIS — E1122 Type 2 diabetes mellitus with diabetic chronic kidney disease: Secondary | ICD-10-CM | POA: Diagnosis not present

## 2023-10-05 DIAGNOSIS — R531 Weakness: Secondary | ICD-10-CM | POA: Diagnosis not present

## 2023-10-05 DIAGNOSIS — D529 Folate deficiency anemia, unspecified: Secondary | ICD-10-CM | POA: Diagnosis not present

## 2023-10-05 DIAGNOSIS — Z1329 Encounter for screening for other suspected endocrine disorder: Secondary | ICD-10-CM | POA: Diagnosis not present

## 2023-10-11 DIAGNOSIS — K2100 Gastro-esophageal reflux disease with esophagitis, without bleeding: Secondary | ICD-10-CM | POA: Diagnosis not present

## 2023-10-11 DIAGNOSIS — E7849 Other hyperlipidemia: Secondary | ICD-10-CM | POA: Diagnosis not present

## 2023-10-11 DIAGNOSIS — I10 Essential (primary) hypertension: Secondary | ICD-10-CM | POA: Diagnosis not present

## 2023-10-11 DIAGNOSIS — E782 Mixed hyperlipidemia: Secondary | ICD-10-CM | POA: Diagnosis not present

## 2023-10-11 DIAGNOSIS — E1142 Type 2 diabetes mellitus with diabetic polyneuropathy: Secondary | ICD-10-CM | POA: Diagnosis not present

## 2023-10-16 DIAGNOSIS — E782 Mixed hyperlipidemia: Secondary | ICD-10-CM | POA: Diagnosis not present

## 2023-10-16 DIAGNOSIS — E1142 Type 2 diabetes mellitus with diabetic polyneuropathy: Secondary | ICD-10-CM | POA: Diagnosis not present

## 2023-10-16 DIAGNOSIS — I10 Essential (primary) hypertension: Secondary | ICD-10-CM | POA: Diagnosis not present

## 2023-11-14 DIAGNOSIS — H6123 Impacted cerumen, bilateral: Secondary | ICD-10-CM | POA: Diagnosis not present

## 2023-11-14 DIAGNOSIS — H6121 Impacted cerumen, right ear: Secondary | ICD-10-CM | POA: Diagnosis not present

## 2023-11-14 DIAGNOSIS — H6122 Impacted cerumen, left ear: Secondary | ICD-10-CM | POA: Diagnosis not present

## 2023-11-14 DIAGNOSIS — H903 Sensorineural hearing loss, bilateral: Secondary | ICD-10-CM | POA: Diagnosis not present

## 2023-11-16 DIAGNOSIS — I10 Essential (primary) hypertension: Secondary | ICD-10-CM | POA: Diagnosis not present

## 2023-11-16 DIAGNOSIS — E1142 Type 2 diabetes mellitus with diabetic polyneuropathy: Secondary | ICD-10-CM | POA: Diagnosis not present

## 2023-11-16 DIAGNOSIS — E782 Mixed hyperlipidemia: Secondary | ICD-10-CM | POA: Diagnosis not present

## 2023-11-16 DIAGNOSIS — I5032 Chronic diastolic (congestive) heart failure: Secondary | ICD-10-CM | POA: Diagnosis not present

## 2023-11-19 DIAGNOSIS — I25118 Atherosclerotic heart disease of native coronary artery with other forms of angina pectoris: Secondary | ICD-10-CM | POA: Diagnosis not present

## 2023-11-19 DIAGNOSIS — E782 Mixed hyperlipidemia: Secondary | ICD-10-CM | POA: Diagnosis not present

## 2023-11-19 DIAGNOSIS — E1142 Type 2 diabetes mellitus with diabetic polyneuropathy: Secondary | ICD-10-CM | POA: Diagnosis not present

## 2023-11-19 DIAGNOSIS — Z7189 Other specified counseling: Secondary | ICD-10-CM | POA: Diagnosis not present

## 2023-11-19 DIAGNOSIS — H6121 Impacted cerumen, right ear: Secondary | ICD-10-CM | POA: Diagnosis not present

## 2023-11-19 DIAGNOSIS — M8580 Other specified disorders of bone density and structure, unspecified site: Secondary | ICD-10-CM | POA: Diagnosis not present

## 2023-11-19 DIAGNOSIS — I10 Essential (primary) hypertension: Secondary | ICD-10-CM | POA: Diagnosis not present

## 2023-11-19 DIAGNOSIS — M5416 Radiculopathy, lumbar region: Secondary | ICD-10-CM | POA: Diagnosis not present

## 2023-11-19 DIAGNOSIS — K610 Anal abscess: Secondary | ICD-10-CM | POA: Diagnosis not present

## 2023-11-30 DIAGNOSIS — Z9181 History of falling: Secondary | ICD-10-CM | POA: Diagnosis not present

## 2023-11-30 DIAGNOSIS — T148XXA Other injury of unspecified body region, initial encounter: Secondary | ICD-10-CM | POA: Diagnosis not present

## 2023-11-30 DIAGNOSIS — S0990XA Unspecified injury of head, initial encounter: Secondary | ICD-10-CM | POA: Diagnosis not present

## 2023-12-04 DIAGNOSIS — M79601 Pain in right arm: Secondary | ICD-10-CM | POA: Diagnosis not present

## 2023-12-04 DIAGNOSIS — S40811A Abrasion of right upper arm, initial encounter: Secondary | ICD-10-CM | POA: Diagnosis not present

## 2023-12-04 DIAGNOSIS — Z9181 History of falling: Secondary | ICD-10-CM | POA: Diagnosis not present

## 2023-12-04 DIAGNOSIS — S40812A Abrasion of left upper arm, initial encounter: Secondary | ICD-10-CM | POA: Diagnosis not present

## 2023-12-06 DIAGNOSIS — K610 Anal abscess: Secondary | ICD-10-CM | POA: Diagnosis not present

## 2023-12-06 DIAGNOSIS — I10 Essential (primary) hypertension: Secondary | ICD-10-CM | POA: Diagnosis not present

## 2023-12-06 DIAGNOSIS — I509 Heart failure, unspecified: Secondary | ICD-10-CM | POA: Diagnosis not present

## 2023-12-06 DIAGNOSIS — T148XXA Other injury of unspecified body region, initial encounter: Secondary | ICD-10-CM | POA: Diagnosis not present

## 2023-12-06 DIAGNOSIS — M5416 Radiculopathy, lumbar region: Secondary | ICD-10-CM | POA: Diagnosis not present

## 2023-12-06 DIAGNOSIS — M8580 Other specified disorders of bone density and structure, unspecified site: Secondary | ICD-10-CM | POA: Diagnosis not present

## 2023-12-06 DIAGNOSIS — R2681 Unsteadiness on feet: Secondary | ICD-10-CM | POA: Diagnosis not present

## 2023-12-06 DIAGNOSIS — E1142 Type 2 diabetes mellitus with diabetic polyneuropathy: Secondary | ICD-10-CM | POA: Diagnosis not present

## 2023-12-06 DIAGNOSIS — Z9181 History of falling: Secondary | ICD-10-CM | POA: Diagnosis not present

## 2023-12-06 DIAGNOSIS — I25118 Atherosclerotic heart disease of native coronary artery with other forms of angina pectoris: Secondary | ICD-10-CM | POA: Diagnosis not present

## 2023-12-06 DIAGNOSIS — E782 Mixed hyperlipidemia: Secondary | ICD-10-CM | POA: Diagnosis not present

## 2023-12-17 DIAGNOSIS — I5032 Chronic diastolic (congestive) heart failure: Secondary | ICD-10-CM | POA: Diagnosis not present

## 2023-12-17 DIAGNOSIS — E782 Mixed hyperlipidemia: Secondary | ICD-10-CM | POA: Diagnosis not present

## 2023-12-17 DIAGNOSIS — E1165 Type 2 diabetes mellitus with hyperglycemia: Secondary | ICD-10-CM | POA: Diagnosis not present

## 2023-12-21 DIAGNOSIS — M8580 Other specified disorders of bone density and structure, unspecified site: Secondary | ICD-10-CM | POA: Diagnosis not present

## 2023-12-21 DIAGNOSIS — E782 Mixed hyperlipidemia: Secondary | ICD-10-CM | POA: Diagnosis not present

## 2023-12-21 DIAGNOSIS — K5900 Constipation, unspecified: Secondary | ICD-10-CM | POA: Diagnosis not present

## 2023-12-21 DIAGNOSIS — E1142 Type 2 diabetes mellitus with diabetic polyneuropathy: Secondary | ICD-10-CM | POA: Diagnosis not present

## 2023-12-21 DIAGNOSIS — T148XXA Other injury of unspecified body region, initial encounter: Secondary | ICD-10-CM | POA: Diagnosis not present

## 2023-12-21 DIAGNOSIS — K610 Anal abscess: Secondary | ICD-10-CM | POA: Diagnosis not present

## 2023-12-21 DIAGNOSIS — I25118 Atherosclerotic heart disease of native coronary artery with other forms of angina pectoris: Secondary | ICD-10-CM | POA: Diagnosis not present

## 2023-12-21 DIAGNOSIS — I10 Essential (primary) hypertension: Secondary | ICD-10-CM | POA: Diagnosis not present

## 2023-12-21 DIAGNOSIS — M5416 Radiculopathy, lumbar region: Secondary | ICD-10-CM | POA: Diagnosis not present

## 2024-01-06 DIAGNOSIS — I10 Essential (primary) hypertension: Secondary | ICD-10-CM | POA: Diagnosis not present

## 2024-01-06 DIAGNOSIS — R2681 Unsteadiness on feet: Secondary | ICD-10-CM | POA: Diagnosis not present

## 2024-01-15 DIAGNOSIS — I25118 Atherosclerotic heart disease of native coronary artery with other forms of angina pectoris: Secondary | ICD-10-CM | POA: Diagnosis not present

## 2024-01-15 DIAGNOSIS — I10 Essential (primary) hypertension: Secondary | ICD-10-CM | POA: Diagnosis not present

## 2024-01-15 DIAGNOSIS — K610 Anal abscess: Secondary | ICD-10-CM | POA: Diagnosis not present

## 2024-01-15 DIAGNOSIS — M5416 Radiculopathy, lumbar region: Secondary | ICD-10-CM | POA: Diagnosis not present

## 2024-01-15 DIAGNOSIS — K5900 Constipation, unspecified: Secondary | ICD-10-CM | POA: Diagnosis not present

## 2024-01-15 DIAGNOSIS — E782 Mixed hyperlipidemia: Secondary | ICD-10-CM | POA: Diagnosis not present

## 2024-01-15 DIAGNOSIS — M8580 Other specified disorders of bone density and structure, unspecified site: Secondary | ICD-10-CM | POA: Diagnosis not present

## 2024-01-15 DIAGNOSIS — T148XXA Other injury of unspecified body region, initial encounter: Secondary | ICD-10-CM | POA: Diagnosis not present

## 2024-01-15 DIAGNOSIS — E1142 Type 2 diabetes mellitus with diabetic polyneuropathy: Secondary | ICD-10-CM | POA: Diagnosis not present

## 2024-01-30 DIAGNOSIS — M5416 Radiculopathy, lumbar region: Secondary | ICD-10-CM | POA: Diagnosis not present

## 2024-01-30 DIAGNOSIS — T148XXA Other injury of unspecified body region, initial encounter: Secondary | ICD-10-CM | POA: Diagnosis not present

## 2024-01-30 DIAGNOSIS — I10 Essential (primary) hypertension: Secondary | ICD-10-CM | POA: Diagnosis not present

## 2024-01-30 DIAGNOSIS — K610 Anal abscess: Secondary | ICD-10-CM | POA: Diagnosis not present

## 2024-01-30 DIAGNOSIS — E1142 Type 2 diabetes mellitus with diabetic polyneuropathy: Secondary | ICD-10-CM | POA: Diagnosis not present

## 2024-01-30 DIAGNOSIS — M8580 Other specified disorders of bone density and structure, unspecified site: Secondary | ICD-10-CM | POA: Diagnosis not present

## 2024-01-30 DIAGNOSIS — K5900 Constipation, unspecified: Secondary | ICD-10-CM | POA: Diagnosis not present

## 2024-01-30 DIAGNOSIS — E782 Mixed hyperlipidemia: Secondary | ICD-10-CM | POA: Diagnosis not present

## 2024-01-30 DIAGNOSIS — I25118 Atherosclerotic heart disease of native coronary artery with other forms of angina pectoris: Secondary | ICD-10-CM | POA: Diagnosis not present

## 2024-02-05 DIAGNOSIS — Z9181 History of falling: Secondary | ICD-10-CM | POA: Diagnosis not present

## 2024-02-05 DIAGNOSIS — I10 Essential (primary) hypertension: Secondary | ICD-10-CM | POA: Diagnosis not present

## 2024-02-05 DIAGNOSIS — R2681 Unsteadiness on feet: Secondary | ICD-10-CM | POA: Diagnosis not present

## 2024-02-06 DIAGNOSIS — R5382 Chronic fatigue, unspecified: Secondary | ICD-10-CM | POA: Diagnosis not present

## 2024-02-06 DIAGNOSIS — E7849 Other hyperlipidemia: Secondary | ICD-10-CM | POA: Diagnosis not present

## 2024-02-06 DIAGNOSIS — N183 Chronic kidney disease, stage 3 unspecified: Secondary | ICD-10-CM | POA: Diagnosis not present

## 2024-02-06 DIAGNOSIS — D529 Folate deficiency anemia, unspecified: Secondary | ICD-10-CM | POA: Diagnosis not present

## 2024-02-06 DIAGNOSIS — E1142 Type 2 diabetes mellitus with diabetic polyneuropathy: Secondary | ICD-10-CM | POA: Diagnosis not present
